# Patient Record
Sex: Male | Born: 1955 | Race: White | Hispanic: No | Smoking: Former smoker
Health system: Southern US, Community
[De-identification: ages and names within clinical notes are randomized; demographics above are authoritative.]

## PROBLEM LIST (undated history)

## (undated) DIAGNOSIS — Z9989 Dependence on other enabling machines and devices: Secondary | ICD-10-CM

## (undated) DIAGNOSIS — I480 Paroxysmal atrial fibrillation: Secondary | ICD-10-CM

## (undated) DIAGNOSIS — I4892 Unspecified atrial flutter: Secondary | ICD-10-CM

## (undated) DIAGNOSIS — I714 Abdominal aortic aneurysm, without rupture: Secondary | ICD-10-CM

## (undated) DIAGNOSIS — G4733 Obstructive sleep apnea (adult) (pediatric): Secondary | ICD-10-CM

## (undated) DIAGNOSIS — G4730 Sleep apnea, unspecified: Secondary | ICD-10-CM

## (undated) DIAGNOSIS — E785 Hyperlipidemia, unspecified: Secondary | ICD-10-CM

## (undated) HISTORY — DX: Unspecified atrial flutter: I48.92

## (undated) HISTORY — DX: Obstructive sleep apnea (adult) (pediatric): G47.33

## (undated) HISTORY — DX: Paroxysmal atrial fibrillation: I48.0

## (undated) HISTORY — PX: NO PAST SURGERIES: SHX2092

## (undated) HISTORY — DX: Sleep apnea, unspecified: G47.30

## (undated) HISTORY — DX: Dependence on other enabling machines and devices: Z99.89

---
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
## 2000-08-10 ENCOUNTER — Emergency Department (HOSPITAL_COMMUNITY): Admission: EM | Admit: 2000-08-10 | Discharge: 2000-08-10 | Payer: Self-pay | Admitting: Emergency Medicine

## 2004-10-26 ENCOUNTER — Ambulatory Visit: Payer: Self-pay | Admitting: Family Medicine

## 2004-11-01 ENCOUNTER — Ambulatory Visit: Payer: Self-pay | Admitting: Family Medicine

## 2005-10-31 ENCOUNTER — Ambulatory Visit: Payer: Self-pay | Admitting: Family Medicine

## 2005-11-20 ENCOUNTER — Ambulatory Visit: Payer: Self-pay | Admitting: Family Medicine

## 2006-11-27 ENCOUNTER — Telehealth: Payer: Self-pay | Admitting: Family Medicine

## 2006-12-28 ENCOUNTER — Ambulatory Visit: Payer: Self-pay | Admitting: Family Medicine

## 2006-12-28 LAB — CONVERTED CEMR LAB
ALT: 23 units/L (ref 0–53)
Albumin: 3.9 g/dL (ref 3.5–5.2)
Alkaline Phosphatase: 49 units/L (ref 39–117)
BUN: 20 mg/dL (ref 6–23)
Basophils Absolute: 0 10*3/uL (ref 0.0–0.1)
Basophils Relative: 0.8 % (ref 0.0–1.0)
Bilirubin Urine: NEGATIVE
Blood in Urine, dipstick: NEGATIVE
CO2: 26 meq/L (ref 19–32)
Calcium: 9.6 mg/dL (ref 8.4–10.5)
Cholesterol: 206 mg/dL (ref 0–200)
Creatinine, Ser: 1.2 mg/dL (ref 0.4–1.5)
Direct LDL: 144 mg/dL
GFR calc Af Amer: 81 mL/min
Glucose, Urine, Semiquant: NEGATIVE
HDL: 50.1 mg/dL (ref >39.0)
Ketones, urine, test strip: NEGATIVE
MCHC: 35 g/dL (ref 30.0–36.0)
Monocytes Absolute: 0.6 10*3/uL (ref 0.2–0.7)
Monocytes Relative: 12.7 % — ABNORMAL HIGH (ref 3.0–11.0)
Neutro Abs: 2.2 10*3/uL (ref 1.4–7.7)
Platelets: 203 10*3/uL (ref 150–400)
Potassium: 4.6 meq/L (ref 3.5–5.1)
Protein, U semiquant: NEGATIVE
RDW: 12.1 % (ref 11.5–14.6)
TSH: 2.7 microintl units/mL (ref 0.35–5.50)
Total CHOL/HDL Ratio: 4.1
Total Protein: 6.8 g/dL (ref 6.0–8.3)
Urobilinogen, UA: 0.2
VLDL: 10 mg/dL (ref 0–40)

## 2007-01-07 ENCOUNTER — Telehealth (INDEPENDENT_AMBULATORY_CARE_PROVIDER_SITE_OTHER): Payer: Self-pay | Admitting: *Deleted

## 2007-01-07 ENCOUNTER — Ambulatory Visit: Payer: Self-pay | Admitting: Family Medicine

## 2007-01-07 DIAGNOSIS — E785 Hyperlipidemia, unspecified: Secondary | ICD-10-CM | POA: Insufficient documentation

## 2007-01-07 DIAGNOSIS — L578 Other skin changes due to chronic exposure to nonionizing radiation: Secondary | ICD-10-CM | POA: Insufficient documentation

## 2007-07-26 ENCOUNTER — Telehealth: Payer: Self-pay | Admitting: *Deleted

## 2008-01-02 ENCOUNTER — Ambulatory Visit: Payer: Self-pay | Admitting: Family Medicine

## 2008-01-02 LAB — CONVERTED CEMR LAB
Albumin: 4.1 g/dL (ref 3.5–5.2)
Alkaline Phosphatase: 38 units/L — ABNORMAL LOW (ref 39–117)
BUN: 17 mg/dL (ref 6–23)
Basophils Absolute: 0 10*3/uL (ref 0.0–0.1)
Bilirubin Urine: NEGATIVE
Blood in Urine, dipstick: NEGATIVE
Cholesterol: 202 mg/dL (ref 0–200)
Direct LDL: 113.4 mg/dL
Eosinophils Absolute: 0.1 10*3/uL (ref 0.0–0.7)
Eosinophils Relative: 2.6 % (ref 0.0–5.0)
GFR calc Af Amer: 90 mL/min
GFR calc non Af Amer: 74 mL/min
HCT: 40.1 % (ref 39.0–52.0)
HDL: 52.4 mg/dL (ref >39.0)
MCHC: 34.7 g/dL (ref 30.0–36.0)
MCV: 102 fL — ABNORMAL HIGH (ref 78.0–100.0)
Monocytes Absolute: 0.5 10*3/uL (ref 0.1–1.0)
Neutrophils Relative %: 44.9 % (ref 43.0–77.0)
Platelets: 177 10*3/uL (ref 150–400)
Potassium: 3.7 meq/L (ref 3.5–5.1)
RDW: 11.9 % (ref 11.5–14.6)
Specific Gravity, Urine: <1.005
TSH: 2.24 microintl units/mL (ref 0.35–5.50)
Total Bilirubin: 2.7 mg/dL — ABNORMAL HIGH (ref 0.3–1.2)
Triglycerides: 72 mg/dL (ref 0–149)
WBC: 3.8 10*3/uL — ABNORMAL LOW (ref 4.5–10.5)
pH: 6

## 2008-01-09 ENCOUNTER — Ambulatory Visit: Payer: Self-pay | Admitting: Family Medicine

## 2008-01-13 ENCOUNTER — Telehealth: Payer: Self-pay | Admitting: *Deleted

## 2008-03-09 ENCOUNTER — Telehealth: Payer: Self-pay | Admitting: *Deleted

## 2008-04-15 ENCOUNTER — Telehealth: Payer: Self-pay | Admitting: Family Medicine

## 2008-08-18 ENCOUNTER — Telehealth: Payer: Self-pay | Admitting: *Deleted

## 2008-11-04 ENCOUNTER — Telehealth: Payer: Self-pay | Admitting: Family Medicine

## 2009-01-04 ENCOUNTER — Telehealth: Payer: Self-pay | Admitting: Family Medicine

## 2009-01-05 ENCOUNTER — Ambulatory Visit: Payer: Self-pay | Admitting: Family Medicine

## 2009-01-05 LAB — CONVERTED CEMR LAB
ALT: 30 units/L (ref 0–53)
BUN: 16 mg/dL (ref 6–23)
Basophils Absolute: 0 10*3/uL (ref 0.0–0.1)
Bilirubin Urine: NEGATIVE
Chloride: 99 meq/L (ref 96–112)
Eosinophils Relative: 2.7 % (ref 0.0–5.0)
Glucose, Bld: 102 mg/dL — ABNORMAL HIGH (ref 70–99)
Glucose, Urine, Semiquant: NEGATIVE
HCT: 38.9 % — ABNORMAL LOW (ref 39.0–52.0)
Lymphs Abs: 1.3 10*3/uL (ref 0.7–4.0)
MCHC: 35.5 g/dL (ref 30.0–36.0)
MCV: 105.6 fL — ABNORMAL HIGH (ref 78.0–100.0)
Monocytes Absolute: 0.5 10*3/uL (ref 0.1–1.0)
PSA: 0.44 ng/mL (ref 0.10–4.00)
Platelets: 172 10*3/uL (ref 150.0–400.0)
Potassium: 4.3 meq/L (ref 3.5–5.1)
RDW: 12.1 % (ref 11.5–14.6)
Specific Gravity, Urine: 1.01
TSH: 1.95 microintl units/mL (ref 0.35–5.50)
Total Bilirubin: 3.4 mg/dL — ABNORMAL HIGH (ref 0.3–1.2)
WBC Urine, dipstick: NEGATIVE
pH: 5.5

## 2009-01-12 ENCOUNTER — Ambulatory Visit: Payer: Self-pay | Admitting: Family Medicine

## 2009-05-13 ENCOUNTER — Telehealth: Payer: Self-pay | Admitting: Family Medicine

## 2009-11-15 ENCOUNTER — Telehealth: Payer: Self-pay | Admitting: Family Medicine

## 2010-02-01 ENCOUNTER — Ambulatory Visit: Payer: Self-pay | Admitting: Family Medicine

## 2010-02-01 LAB — CONVERTED CEMR LAB
Albumin: 4.2 g/dL (ref 3.5–5.2)
Basophils Absolute: 0 10*3/uL (ref 0.0–0.1)
CO2: 29 meq/L (ref 19–32)
Calcium: 9.3 mg/dL (ref 8.4–10.5)
Direct LDL: 126.8 mg/dL
Eosinophils Absolute: 0.1 10*3/uL (ref 0.0–0.7)
Glucose, Bld: 107 mg/dL — ABNORMAL HIGH (ref 70–99)
HCT: 40.6 % (ref 39.0–52.0)
Hemoglobin: 14 g/dL (ref 13.0–17.0)
Leukocytes, UA: NEGATIVE
Lymphocytes Relative: 31.3 % (ref 12.0–46.0)
Lymphs Abs: 1.3 10*3/uL (ref 0.7–4.0)
MCHC: 34.4 g/dL (ref 30.0–36.0)
MCV: 101.3 fL — ABNORMAL HIGH (ref 78.0–100.0)
Monocytes Absolute: 0.6 10*3/uL (ref 0.1–1.0)
Neutro Abs: 2.1 10*3/uL (ref 1.4–7.7)
PSA: 0.85 ng/mL (ref 0.10–4.00)
RDW: 13.2 % (ref 11.5–14.6)
Sodium: 135 meq/L (ref 135–145)
Specific Gravity, Urine: <=1.005 (ref 1.000–1.030)
TSH: 1.48 microintl units/mL (ref 0.35–5.50)
Triglycerides: 60 mg/dL (ref 0.0–149.0)
Urobilinogen, UA: 0.2 (ref 0.0–1.0)

## 2010-02-08 ENCOUNTER — Encounter: Payer: Self-pay | Admitting: Family Medicine

## 2010-02-08 ENCOUNTER — Ambulatory Visit: Payer: Self-pay | Admitting: Family Medicine

## 2010-04-07 NOTE — Progress Notes (Signed)
Summary: refill diazepam  Phone Note From Pharmacy   Caller: [facility]  [phone]* Call For: todd  Summary of Call: refill diazepam 5mg  1 by mouth at bedtime as needed  Initial call taken by: Alfred Levins, CMA,  May 13, 2009 2:31 PM  Follow-up for Phone Call        valium 5 mg, dispense 30 tabs directions one nightly p.r.n. refills x 3 Follow-up by: Roderick Pee MD,  May 13, 2009 2:41 PM  Additional Follow-up for Phone Call Additional follow up Details #1::        Phone call completed, Pharmacist called Additional Follow-up by: Alfred Levins, CMA,  May 13, 2009 2:59 PM    Prescriptions: DIAZEPAM 5 MG TABS (DIAZEPAM) take one tab at bedtime  #30 x 3   Entered by:   Alfred Levins, CMA   Authorized by:   Roderick Pee MD   Signed by:   Alfred Levins, CMA on 05/13/2009   Method used:   Telephoned to ...       [facility]  [phone]* (retail)       [address] [place], [place]  [address]       Ph: [phone] or [phone]       Fax: [phone]   RxID:   [id number]

## 2010-04-07 NOTE — Progress Notes (Signed)
Summary: diazepam refill  Phone Note Refill Request Message from:  Fax from Pharmacy on November 15, 2009 12:34 PM  Refills Requested: Medication #1:  DIAZEPAM 5 MG TABS take one tab at bedtime Initial call taken by: Kern Reap CMA Duncan Dull),  November 15, 2009 12:34 PM    Prescriptions: DIAZEPAM 5 MG TABS (DIAZEPAM) take one tab at bedtime  #90 x 1   Entered by:   Kern Reap CMA (AAMA)   Authorized by:   Roderick Pee MD   Signed by:   Kern Reap CMA (AAMA) on 11/15/2009   Method used:   Historical   RxID:   [id number]

## 2010-04-07 NOTE — Assessment & Plan Note (Signed)
Summary: CPX // RS   Vital Signs:  Patient profile:   55 year old male Height:      72.25 inches Weight:      179 pounds BMI:     24.20 Temp:     98.9 degrees F oral BP sitting:   124 / 84  (left arm) Cuff size:   regular  Vitals Entered By: Kern Reap CMA Duncan Dull) (February 08, 2010 4:18 PM) CC: cpx Is Patient Diabetic? No Pain Assessment Patient in pain? yes        CC:  cpx.  History of Present Illness: Steven Byrd is a 55 year old, married man nonsmoker, who comes in today for general physical examination  He is an occasional herpetic outbreak for which he takes Zovirax 800 mg 3 times a day.  He also takes a quarter of a tablet a proscribed daily, and diazepam nightly, p.r.n. for sleep.  He also takes simvastatin 40 mg nightly for hyperlipidemia, and prior 100 mg p.r.n. for ED.  Review of systems negative.  Colonoscopy at age 26.  Normal tetanus booster and flu shot today.  Review of systems otherwise negative.  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  Allergies: No Known Drug Allergies  Past History:  Past medical, surgical, family and social histories (including risk factors) reviewed, and no changes noted (except as noted below).  Past Medical History: Reviewed history from 01/07/2007 and no changes required. Hyperlipidemia sun damaged skin  Family History: Reviewed history from 01/07/2007 and no changes required. Family History of CAD Male 1st degree relative <60 Family History Diabetes 1st degree relative Family History Hypertension Family History of Prostate CA 1st degree relative <50  Social History: Reviewed history from 01/07/2007 and no changes required. Occupation:[employer] Married divorced now remarried Former Smoker Alcohol use-yes Drug use-no Regular exercise-yes  Review of Systems      See HPI       Flu Vaccine Consent Questions     Do  you have a history of severe allergic reactions to this vaccine? no    Any prior history of allergic reactions to egg and/or gelatin? no    Do you have a sensitivity to the preservative Thimersol? no    Do you have a past history of Guillan-Barre Syndrome? no    Do you currently have an acute febrile illness? no    Have you ever had a severe reaction to latex? no    Vaccine information given and explained to patient? yes    Are you currently pregnant? no    Lot Number:[id number]   Exp Date:09/03/2010   Site Given  right  Deltoid IM   Physical Exam  General:  Well-developed,well-nourished,in no acute distress; alert,appropriate and cooperative throughout examination Head:  Normocephalic and atraumatic without obvious abnormalities. No apparent alopecia or balding. Eyes:  No corneal or conjunctival inflammation noted. EOMI. Perrla. Funduscopic exam benign, without hemorrhages, exudates or papilledema. Vision grossly normal. Ears:  External ear exam shows no significant lesions or deformities.  Otoscopic examination reveals clear canals, tympanic membranes are intact bilaterally without bulging, retraction, inflammation or discharge. Hearing is grossly normal bilaterally. Nose:  External nasal examination shows no deformity or inflammation. Nasal mucosa are pink and moist without lesions or exudates. Mouth:  Oral mucosa and oropharynx without lesions or exudates.  Teeth in good repair. Neck:  No deformities, masses, or tenderness noted.  Chest Wall:  No deformities, masses, tenderness or gynecomastia noted. Breasts:  No masses or gynecomastia noted Lungs:  Normal respiratory effort, chest expands symmetrically. Lungs are clear to auscultation, no crackles or wheezes. Heart:  Normal rate and regular rhythm. S1 and S2 normal without gallop, murmur, click, rub or other extra sounds. Abdomen:  Bowel sounds positive,abdomen soft and non-tender without masses, organomegaly or hernias noted. Rectal:   No external abnormalities noted. Normal sphincter tone. No rectal masses or tenderness. Genitalia:  Testes bilaterally descended without nodularity, tenderness or masses. No scrotal masses or lesions. No penis lesions or urethral discharge. Prostate:  Prostate gland firm and smooth, no enlargement, nodularity, tenderness, mass, asymmetry or induration. Msk:  No deformity or scoliosis noted of thoracic or lumbar spine.   Pulses:  R and L carotid,radial,femoral,dorsalis pedis and posterior tibial pulses are full and equal bilaterally Extremities:  No clubbing, cyanosis, edema, or deformity noted with normal full range of motion of all joints.   Neurologic:  No cranial nerve deficits noted. Station and gait are normal. Plantar reflexes are down-going bilaterally. DTRs are symmetrical throughout. Sensory, motor and coordinative functions appear intact. Skin:  Intact without suspicious lesions or rashes Cervical Nodes:  No lymphadenopathy noted Axillary Nodes:  No palpable lymphadenopathy Inguinal Nodes:  No significant adenopathy Psych:  Cognition and judgment appear intact. Alert and cooperative with normal attention span and concentration. No apparent delusions, illusions, hallucinations   Impression & Recommendations:  Problem # 1:  HYPERLIPIDEMIA (ICD-272.4) Assessment Improved  His updated medication list for this problem includes:    Simvastatin 40 Mg Tabs (Simvastatin) .[name]... 1 tab @ bedtime  Orders: Prescription Created Electronically [phone]) EKG w/ Interpretation (93000)  Problem # 2:  PHYSICAL EXAMINATION (ICD-V70.0) Assessment: Improved  Orders: Prescription Created Electronically [phone]) EKG w/ Interpretation (93000)  Complete Medication List: 1)  Asa 81  2)  Mvi  3)  Fish Oil/ Fax Seed  4)  Zovirax 800 Mg Tabs (Acyclovir) .... Take 1 tablet by mouth three times a day prn 5)  Proscar 5 Mg Tabs (Finasteride) .... Take quarter of tablet once daily 6)  Diazepam 5 Mg Tabs  (Diazepam) .... Take one tab at bedtime 7)  Simvastatin 40 Mg Tabs (Simvastatin) .[name].. 1 tab @ bedtime 8)  Viagra 100 Mg Tabs (Sildenafil citrate) .... Uad  Other Orders: Admin 1st Vaccine (09811) Flu Vaccine 25yrs + (91478) TD Toxoids IM 7 YR + (29562) Admin of Any Addtl Vaccine (13086)  Patient Instructions: 1)  Please schedule a follow-up appointment in 1 year. Prescriptions: DIAZEPAM 5 MG TABS (DIAZEPAM) take one tab at bedtime  #90 x 1   Entered and Authorized by:   Roderick Pee MD   Signed by:   Roderick Pee MD on 02/08/2010   Method used:   Print then Give to Patient   RxID:   [id number] VIAGRA 100 MG TABS (SILDENAFIL CITRATE) UAD  #6 x 11   Entered and Authorized by:   Roderick Pee MD   Signed by:   Roderick Pee MD on 02/08/2010   Method used:   Electronically to        [facility]  [phone]* (retail)       [address] [place], [place]  [address]       Ph: [phone] or [phone]       Fax: [phone]   RxID:   [id number] SIMVASTATIN 40 MG TABS (SIMVASTATIN) 1 tab @  bedtime  #100 x 3   Entered and Authorized by:   Roderick Pee MD   Signed by:   Roderick Pee MD on 02/08/2010   Method used:   Electronically to        [facility]  [phone]* (retail)       [address] [place], [place]  [address]       Ph: [phone] or [phone]       Fax: [phone]   RxID:   [id number] PROSCAR 5 MG TABS (FINASTERIDE) take quarter of tablet once daily  #8 Tablet x 11   Entered and Authorized by:   Roderick Pee MD   Signed by:   Roderick Pee MD on 02/08/2010   Method used:   Electronically to        [facility]  [phone]* (retail)       [address] [place], [place]  [address]       Ph: [phone] or [phone]       Fax: [phone]   RxID:   [id number] ZOVIRAX 800 MG  TABS (ACYCLOVIR) Take 1 tablet by mouth three times a day prn  #100 x 2   Entered and Authorized by:   Roderick Pee MD    Signed by:   Roderick Pee MD on 02/08/2010   Method used:   Electronically to        [facility]  [phone]* (retail)       [address] [place], [place]  [address]       Ph: [phone] or [phone]       Fax: [phone]   RxID:   [id number]    Orders Added: 1)  Prescription Created Electronically [G8553] 2)  Est. Patient 40-64 years [99396] 3)  EKG w/ Interpretation [93000] 4)  Admin 1st Vaccine [90471] 5)  Flu Vaccine 56yrs + [90658] 6)  TD Toxoids IM 7 YR + [90714] 7)  Admin of Any Addtl Vaccine [25852]   Immunizations Administered:  Tetanus Vaccine:    Vaccine Type: Td    Site: left deltoid    Mfr: GlaxoSmithKline    Dose: 0.5 ml    Route: IM    Given by: Kern Reap CMA (AAMA)    Exp. Date: 12/24/2011    Lot #: [id number]    VIS given: 01/22/08 version given February 08, 2010.    Physician counseled: yes   Immunizations Administered:  Tetanus Vaccine:    Vaccine Type: Td    Site: left deltoid    Mfr: GlaxoSmithKline    Dose: 0.5 ml    Route: IM    Given by: Kern Reap CMA (AAMA)    Exp. Date: 12/24/2011    Lot #: [id number]    VIS given: 01/22/08 version given February 08, 2010.    Physician counseled: yes    Preventive Care Screening  Colonoscopy:    Date:  03/07/2003    Results:  normal

## 2010-08-24 ENCOUNTER — Telehealth: Payer: Self-pay | Admitting: Family Medicine

## 2010-08-24 NOTE — Telephone Encounter (Signed)
Pt called 6/20 requesting Flexorill (spelling?) and a pain med. States he tweaked his back and Dr. Tawanna Cooler does this for him. Please call if needed.

## 2010-08-25 MED ORDER — HYDROCODONE-ACETAMINOPHEN 7.5-750 MG PO TABS: ORAL_TABLET | ORAL | Status: DC

## 2010-08-25 MED ORDER — CYCLOBENZAPRINE HCL 5 MG PO TABS: 5.0000 mg | ORAL_TABLET | Freq: Every day | ORAL | Status: AC

## 2010-08-25 NOTE — Telephone Encounter (Signed)
Left message on machine for patient  To return our call with name of pharmacy

## 2010-08-25 NOTE — Telephone Encounter (Signed)
Flexeril 5 mg, dispense 30, tablets directions one tab nightly p.r.n. For back pain, refills x 1.  Vicodin ES, number 30 directions one half to one tab nightly p.r.n. Back pain.  No refills

## 2010-08-26 NOTE — Telephone Encounter (Signed)
rx sent to [facility]

## 2010-09-01 ENCOUNTER — Other Ambulatory Visit: Payer: Self-pay | Admitting: *Deleted

## 2010-09-01 MED ORDER — DIAZEPAM 5 MG PO TABS: 5.0000 mg | ORAL_TABLET | Freq: Four times a day (QID) | ORAL | Status: AC | PRN

## 2011-03-06 ENCOUNTER — Other Ambulatory Visit: Payer: Self-pay | Admitting: *Deleted

## 2011-03-06 ENCOUNTER — Other Ambulatory Visit: Payer: Self-pay | Admitting: Family Medicine

## 2011-03-06 MED ORDER — DIAZEPAM 5 MG PO TABS: 5.0000 mg | ORAL_TABLET | Freq: Two times a day (BID) | ORAL | Status: AC | PRN

## 2011-04-07 ENCOUNTER — Other Ambulatory Visit: Payer: Self-pay | Admitting: *Deleted

## 2011-04-07 MED ORDER — SILDENAFIL CITRATE 100 MG PO TABS: 100.0000 mg | ORAL_TABLET | ORAL | Status: DC | PRN

## 2011-05-04 ENCOUNTER — Other Ambulatory Visit: Payer: Self-pay | Admitting: Family Medicine

## 2011-06-06 ENCOUNTER — Other Ambulatory Visit: Payer: Self-pay | Admitting: Family Medicine

## 2011-06-07 ENCOUNTER — Other Ambulatory Visit: Payer: Self-pay | Admitting: *Deleted

## 2011-06-07 MED ORDER — DIAZEPAM 5 MG PO TABS: 5.0000 mg | ORAL_TABLET | Freq: Two times a day (BID) | ORAL | Status: AC | PRN

## 2011-07-06 ENCOUNTER — Other Ambulatory Visit: Payer: Self-pay | Admitting: Family Medicine

## 2011-07-06 MED ORDER — SIMVASTATIN 40 MG PO TABS: 40.0000 mg | ORAL_TABLET | Freq: Every day | ORAL | Status: DC

## 2011-07-06 MED ORDER — SILDENAFIL CITRATE 100 MG PO TABS: 100.0000 mg | ORAL_TABLET | ORAL | Status: DC | PRN

## 2011-07-06 NOTE — Telephone Encounter (Signed)
Pt has cpx sch for 10-17-2011. Pt needs refill on viagra 100mg ,simvastatin 40mg  [facility] [phone]

## 2011-07-13 ENCOUNTER — Other Ambulatory Visit: Payer: Self-pay | Admitting: Family Medicine

## 2011-08-16 ENCOUNTER — Other Ambulatory Visit: Payer: Self-pay | Admitting: *Deleted

## 2011-08-16 MED ORDER — DIAZEPAM 5 MG PO TABS: 5.0000 mg | ORAL_TABLET | Freq: Two times a day (BID) | ORAL | Status: DC | PRN

## 2011-10-10 ENCOUNTER — Other Ambulatory Visit: Payer: Self-pay

## 2011-10-17 ENCOUNTER — Encounter: Payer: Self-pay | Admitting: Family Medicine

## 2011-11-12 ENCOUNTER — Other Ambulatory Visit: Payer: Self-pay | Admitting: Family Medicine

## 2011-12-12 ENCOUNTER — Other Ambulatory Visit: Payer: Self-pay | Admitting: Family Medicine

## 2011-12-13 ENCOUNTER — Other Ambulatory Visit: Payer: Self-pay | Admitting: Family Medicine

## 2012-01-11 ENCOUNTER — Other Ambulatory Visit (INDEPENDENT_AMBULATORY_CARE_PROVIDER_SITE_OTHER): Payer: 59

## 2012-01-11 DIAGNOSIS — Z0000 Encounter for general adult medical examination without abnormal findings: Secondary | ICD-10-CM

## 2012-01-11 LAB — POCT URINALYSIS DIPSTICK
Bilirubin, UA: NEGATIVE
Blood, UA: NEGATIVE
Glucose, UA: NEGATIVE
Spec Grav, UA: <=1.005
pH, UA: 6

## 2012-01-11 LAB — LIPID PANEL
Cholesterol: 200 mg/dL (ref 0–200)
HDL: 50.4 mg/dL (ref >39.00)
Total CHOL/HDL Ratio: 4
Triglycerides: 72 mg/dL (ref 0.0–149.0)

## 2012-01-11 LAB — CBC WITH DIFFERENTIAL/PLATELET
Basophils Relative: 1.1 % (ref 0.0–3.0)
Eosinophils Absolute: 0.1 10*3/uL (ref 0.0–0.7)
Eosinophils Relative: 1.8 % (ref 0.0–5.0)
Lymphocytes Relative: 35.3 % (ref 12.0–46.0)
MCHC: 32.9 g/dL (ref 30.0–36.0)
MCV: 101.8 fl — ABNORMAL HIGH (ref 78.0–100.0)
Monocytes Absolute: 0.6 10*3/uL (ref 0.1–1.0)
Neutrophils Relative %: 48.9 % (ref 43.0–77.0)
Platelets: 203 10*3/uL (ref 150.0–400.0)
RBC: 4.14 Mil/uL — ABNORMAL LOW (ref 4.22–5.81)
WBC: 4.3 10*3/uL — ABNORMAL LOW (ref 4.5–10.5)

## 2012-01-11 LAB — HEPATIC FUNCTION PANEL
ALT: 33 U/L (ref 0–53)
AST: 42 U/L — ABNORMAL HIGH (ref 0–37)
Alkaline Phosphatase: 44 U/L (ref 39–117)
Bilirubin, Direct: 0.3 mg/dL (ref 0.0–0.3)
Total Bilirubin: 2 mg/dL — ABNORMAL HIGH (ref 0.3–1.2)
Total Protein: 6.9 g/dL (ref 6.0–8.3)

## 2012-01-11 LAB — BASIC METABOLIC PANEL
BUN: 19 mg/dL (ref 6–23)
Calcium: 8.9 mg/dL (ref 8.4–10.5)
Chloride: 99 mEq/L (ref 96–112)
Creatinine, Ser: 1.1 mg/dL (ref 0.4–1.5)

## 2012-01-18 ENCOUNTER — Encounter: Payer: Self-pay | Admitting: Family Medicine

## 2012-01-18 ENCOUNTER — Ambulatory Visit (INDEPENDENT_AMBULATORY_CARE_PROVIDER_SITE_OTHER): Payer: 59 | Admitting: Family Medicine

## 2012-01-18 VITALS — BP 120/80 | Temp 98.2°F | Ht 72.5 in | Wt 185.0 lb

## 2012-01-18 DIAGNOSIS — N529 Male erectile dysfunction, unspecified: Secondary | ICD-10-CM

## 2012-01-18 DIAGNOSIS — R253 Fasciculation: Secondary | ICD-10-CM | POA: Insufficient documentation

## 2012-01-18 DIAGNOSIS — L578 Other skin changes due to chronic exposure to nonionizing radiation: Secondary | ICD-10-CM

## 2012-01-18 DIAGNOSIS — E785 Hyperlipidemia, unspecified: Secondary | ICD-10-CM

## 2012-01-18 DIAGNOSIS — Z23 Encounter for immunization: Secondary | ICD-10-CM

## 2012-01-18 DIAGNOSIS — R259 Unspecified abnormal involuntary movements: Secondary | ICD-10-CM

## 2012-01-18 MED ORDER — SILDENAFIL CITRATE 100 MG PO TABS: 100.0000 mg | ORAL_TABLET | ORAL | Status: DC | PRN

## 2012-01-18 MED ORDER — FINASTERIDE 5 MG PO TABS: ORAL_TABLET | ORAL | Status: DC

## 2012-01-18 MED ORDER — SIMVASTATIN 40 MG PO TABS: 40.0000 mg | ORAL_TABLET | Freq: Every day | ORAL | Status: DC

## 2012-01-18 MED ORDER — DIAZEPAM 5 MG PO TABS: ORAL_TABLET | ORAL | Status: DC

## 2012-01-18 NOTE — Patient Instructions (Signed)
Continue current medications  Remember to do a thorough skin exam monthly  Return in one year sooner if any problem

## 2012-01-18 NOTE — Progress Notes (Signed)
  Subjective:    Patient ID: Steven Byrd, male    DOB: 13-Apr-1955, 56 y.o.   MRN: [id number]  HPI Steven Byrd is a 56 year old recently separated from his second wife who comes in today for general physical examination  He's always been in excellent health has had no chronic health problems. He takes a quarter of a tablet of finasteride for here rejuvenation  He takes Viagra when necessary for ED,  Zocor 40 mg daily for hyperlipidemia and a generic Valium when necessary for sleep dysfunction  Review of systems negative except he is recently began to experience fasciculations of both right and left calf no neurologic symptoms  Tetanus booster 2011 seasonal flu shot today   Review of Systems  Constitutional: Negative.   HENT: Negative.   Eyes: Negative.   Respiratory: Negative.   Cardiovascular: Negative.   Gastrointestinal: Negative.   Genitourinary: Negative.   Musculoskeletal: Negative.   Skin: Negative.   Neurological: Negative.   Hematological: Negative.   Psychiatric/Behavioral: Negative.        Objective:   Physical Exam  Constitutional: He is oriented to person, place, and time. He appears well-developed and well-nourished.  HENT:  Head: Normocephalic and atraumatic.  Right Ear: External ear normal.  Left Ear: External ear normal.  Nose: Nose normal.  Mouth/Throat: Oropharynx is clear and moist.  Eyes: Conjunctivae normal and EOM are normal. Pupils are equal, round, and reactive to light.  Neck: Normal range of motion. Neck supple. No JVD present. No tracheal deviation present. No thyromegaly present.  Cardiovascular: Normal rate, regular rhythm, normal heart sounds and intact distal pulses.  Exam reveals no gallop and no friction rub.   No murmur heard. Pulmonary/Chest: Effort normal and breath sounds normal. No stridor. No respiratory distress. He has no wheezes. He has no rales. He exhibits no tenderness.  Abdominal: Soft. Bowel sounds are normal. He exhibits  no distension and no mass. There is no tenderness. There is no rebound and no guarding.  Genitourinary: Rectum normal, prostate normal and penis normal. Guaiac negative stool. No penile tenderness.  Musculoskeletal: Normal range of motion. He exhibits no edema and no tenderness.  Lymphadenopathy:    He has no cervical adenopathy.  Neurological: He is alert and oriented to person, place, and time. He has normal reflexes. No cranial nerve deficit. He exhibits normal muscle tone.  Skin: Skin is warm and dry. No rash noted. No erythema. No pallor.       Total body skin exam normal he also had a recent evaluation by dermatology. 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 His behavior is normal. Judgment and thought content normal.          Assessment & Plan:  Healthy male  Continue finasteride for hair loss prevention  Erectile dysfunction continue Viagra  Hyperlipidemia continue Zocor and aspirin  High-risk for skin cancer again advise SPF 50 sunscreens

## 2012-02-13 ENCOUNTER — Other Ambulatory Visit: Payer: Self-pay | Admitting: Family Medicine

## 2012-03-25 ENCOUNTER — Other Ambulatory Visit: Payer: Self-pay | Admitting: Family Medicine

## 2012-04-01 ENCOUNTER — Other Ambulatory Visit: Payer: Self-pay | Admitting: Family Medicine

## 2012-04-03 ENCOUNTER — Other Ambulatory Visit: Payer: Self-pay | Admitting: Family Medicine

## 2012-07-22 ENCOUNTER — Other Ambulatory Visit: Payer: Self-pay | Admitting: Family Medicine

## 2012-08-20 ENCOUNTER — Other Ambulatory Visit: Payer: Self-pay | Admitting: Family Medicine

## 2012-10-11 ENCOUNTER — Other Ambulatory Visit: Payer: Self-pay | Admitting: Family Medicine

## 2012-12-16 ENCOUNTER — Other Ambulatory Visit: Payer: Self-pay | Admitting: Family Medicine

## 2013-01-18 ENCOUNTER — Other Ambulatory Visit: Payer: Self-pay | Admitting: Family Medicine

## 2013-01-21 ENCOUNTER — Other Ambulatory Visit: Payer: Self-pay | Admitting: Family Medicine

## 2013-02-24 ENCOUNTER — Other Ambulatory Visit: Payer: Self-pay | Admitting: Family Medicine

## 2013-03-26 ENCOUNTER — Other Ambulatory Visit: Payer: Self-pay | Admitting: Family Medicine

## 2013-03-28 ENCOUNTER — Other Ambulatory Visit: Payer: Self-pay | Admitting: Family Medicine

## 2013-03-31 ENCOUNTER — Other Ambulatory Visit: Payer: Self-pay | Admitting: Family Medicine

## 2013-03-31 NOTE — Telephone Encounter (Signed)
Pt has appt sch for mar 2015

## 2013-04-28 ENCOUNTER — Other Ambulatory Visit: Payer: Self-pay | Admitting: Family Medicine

## 2013-05-12 ENCOUNTER — Telehealth: Payer: Self-pay | Admitting: Family Medicine

## 2013-05-12 NOTE — Telephone Encounter (Signed)
lmom to cb and sch

## 2013-05-12 NOTE — Telephone Encounter (Signed)
Pt got a piece of glass in his foot several months ago (6-8) .pt states it still bothers him, thought it was out, but it has gotten bothersome. Pt would life to know if he should see dr todd or a referral to someone else. pls advise

## 2013-05-12 NOTE — Telephone Encounter (Signed)
Okay to schedule with Dr Todd 

## 2013-05-13 NOTE — Telephone Encounter (Signed)
Pt has decided to wait until his cpe on 3/23 for dr todd to look at his foot. Pt states this has been going on several months anyway, a few more days won't matter.

## 2013-05-16 ENCOUNTER — Other Ambulatory Visit (INDEPENDENT_AMBULATORY_CARE_PROVIDER_SITE_OTHER): Payer: BC Managed Care – PPO

## 2013-05-16 DIAGNOSIS — Z0000 Encounter for general adult medical examination without abnormal findings: Secondary | ICD-10-CM

## 2013-05-16 LAB — HEPATIC FUNCTION PANEL
ALT: 23 U/L (ref 0–53)
AST: 23 U/L (ref 0–37)
Albumin: 4.3 g/dL (ref 3.5–5.2)
Alkaline Phosphatase: 53 U/L (ref 39–117)
BILIRUBIN TOTAL: 2.3 mg/dL — AB (ref 0.3–1.2)
Bilirubin, Direct: 0.3 mg/dL (ref 0.0–0.3)
TOTAL PROTEIN: 6.9 g/dL (ref 6.0–8.3)

## 2013-05-16 LAB — POCT URINALYSIS DIPSTICK
Bilirubin, UA: NEGATIVE
Blood, UA: NEGATIVE
Glucose, UA: NEGATIVE
Ketones, UA: NEGATIVE
Leukocytes, UA: NEGATIVE
NITRITE UA: NEGATIVE
PH UA: 6
PROTEIN UA: NEGATIVE
Spec Grav, UA: 1.01
Urobilinogen, UA: 0.2

## 2013-05-16 LAB — CBC WITH DIFFERENTIAL/PLATELET
BASOS PCT: 0.5 % (ref 0.0–3.0)
Basophils Absolute: 0 10*3/uL (ref 0.0–0.1)
EOS PCT: 1.9 % (ref 0.0–5.0)
Eosinophils Absolute: 0.1 10*3/uL (ref 0.0–0.7)
HCT: 40.9 % (ref 39.0–52.0)
Hemoglobin: 13.6 g/dL (ref 13.0–17.0)
LYMPHS ABS: 1.6 10*3/uL (ref 0.7–4.0)
Lymphocytes Relative: 26.2 % (ref 12.0–46.0)
MCHC: 33.2 g/dL (ref 30.0–36.0)
MCV: 100.9 fl — ABNORMAL HIGH (ref 78.0–100.0)
Monocytes Absolute: 0.7 10*3/uL (ref 0.1–1.0)
Monocytes Relative: 11.1 % (ref 3.0–12.0)
Neutro Abs: 3.7 10*3/uL (ref 1.4–7.7)
Neutrophils Relative %: 60.3 % (ref 43.0–77.0)
PLATELETS: 204 10*3/uL (ref 150.0–400.0)
RBC: 4.05 Mil/uL — ABNORMAL LOW (ref 4.22–5.81)
RDW: 13.2 % (ref 11.5–14.6)
WBC: 6.1 10*3/uL (ref 4.5–10.5)

## 2013-05-16 LAB — TSH: TSH: 2.23 u[IU]/mL (ref 0.35–5.50)

## 2013-05-16 LAB — BASIC METABOLIC PANEL
BUN: 17 mg/dL (ref 6–23)
CHLORIDE: 99 meq/L (ref 96–112)
CO2: 27 mEq/L (ref 19–32)
Calcium: 9.2 mg/dL (ref 8.4–10.5)
Creatinine, Ser: 1.1 mg/dL (ref 0.4–1.5)
GFR: 71.13 mL/min (ref >60.00)
Glucose, Bld: 86 mg/dL (ref 70–99)
Potassium: 4.3 mEq/L (ref 3.5–5.1)
Sodium: 134 mEq/L — ABNORMAL LOW (ref 135–145)

## 2013-05-16 LAB — LIPID PANEL
Cholesterol: 181 mg/dL (ref 0–200)
HDL: 51.9 mg/dL
LDL Cholesterol: 119 mg/dL — ABNORMAL HIGH (ref 0–99)
Total CHOL/HDL Ratio: 3
Triglycerides: 49 mg/dL (ref 0.0–149.0)
VLDL: 9.8 mg/dL (ref 0.0–40.0)

## 2013-05-16 LAB — PSA: PSA: 0.49 ng/mL (ref 0.10–4.00)

## 2013-05-26 ENCOUNTER — Other Ambulatory Visit: Payer: Self-pay | Admitting: Family Medicine

## 2013-05-26 ENCOUNTER — Ambulatory Visit (INDEPENDENT_AMBULATORY_CARE_PROVIDER_SITE_OTHER): Payer: BC Managed Care – PPO | Admitting: Family Medicine

## 2013-05-26 ENCOUNTER — Encounter: Payer: Self-pay | Admitting: Family Medicine

## 2013-05-26 VITALS — BP 110/80 | Temp 98.7°F | Ht 72.25 in | Wt 188.0 lb

## 2013-05-26 DIAGNOSIS — E785 Hyperlipidemia, unspecified: Secondary | ICD-10-CM

## 2013-05-26 DIAGNOSIS — N529 Male erectile dysfunction, unspecified: Secondary | ICD-10-CM

## 2013-05-26 DIAGNOSIS — L659 Nonscarring hair loss, unspecified: Secondary | ICD-10-CM

## 2013-05-26 DIAGNOSIS — B009 Herpesviral infection, unspecified: Secondary | ICD-10-CM

## 2013-05-26 MED ORDER — SILDENAFIL CITRATE 100 MG PO TABS: 100.0000 mg | ORAL_TABLET | ORAL | Status: DC | PRN

## 2013-05-26 MED ORDER — SIMVASTATIN 40 MG PO TABS: 40.0000 mg | ORAL_TABLET | Freq: Every day | ORAL | Status: DC

## 2013-05-26 MED ORDER — ACYCLOVIR 800 MG PO TABS: ORAL_TABLET | ORAL | Status: DC

## 2013-05-26 MED ORDER — FINASTERIDE 5 MG PO TABS: ORAL_TABLET | ORAL | Status: DC

## 2013-05-26 NOTE — Progress Notes (Signed)
   Subjective:    Patient ID: Steven Byrd, male    DOB: 03-10-1955, 58 y.o.   MRN: [id number]  HPI Steven Byrd is a 58 year old married male nonsmoker who comes in today for general physical examination  He takes acyclovir he milligrams 3 times a day when necessary  He takes 5 mg of Valium when necessary at bedtime for sleep  He takes Proscar 5 mg daily for to prevent hair loss and Viagra 100 mg when necessary for ED and Zocor 40 mg daily for hyperlipidemia  He gets routine eye care, dental care, colonoscopy and GI, vaccinations up-to-date  He is extremely physically active.   Review of Systems  Constitutional: Negative.   HENT: Negative.   Eyes: Negative.   Respiratory: Negative.   Cardiovascular: Negative.   Gastrointestinal: Negative.   Genitourinary: Negative.   Musculoskeletal: Negative.   Skin: Negative.   Neurological: Negative.   Psychiatric/Behavioral: Negative.        Objective:   Physical Exam  Nursing note and vitals reviewed. Constitutional: He is oriented to person, place, and time. He appears well-developed and well-nourished.  HENT:  Head: Normocephalic and atraumatic.  Right Ear: External ear normal.  Left Ear: External ear normal.  Nose: Nose normal.  Mouth/Throat: Oropharynx is clear and moist.  Eyes: Conjunctivae and EOM are normal. Pupils are equal, round, and reactive to light.  Neck: Normal range of motion. Neck supple. No JVD present. No tracheal deviation present. No thyromegaly present.  Cardiovascular: Normal rate, regular rhythm, normal heart sounds and intact distal pulses.  Exam reveals no gallop and no friction rub.   No murmur heard. Pulmonary/Chest: Effort normal and breath sounds normal. No stridor. No respiratory distress. He has no wheezes. He has no rales. He exhibits no tenderness.  Abdominal: Soft. Bowel sounds are normal. He exhibits no distension and no mass. There is no tenderness. There is no rebound and no guarding.    Genitourinary: Rectum normal, prostate normal and penis normal. Guaiac negative stool. No penile tenderness.  Musculoskeletal: Normal range of motion. He exhibits no edema and no tenderness.  Lymphadenopathy:    He has no cervical adenopathy.  Neurological: He is alert and oriented to person, place, and time. He has normal reflexes. No cranial nerve deficit. He exhibits normal muscle tone.  Skin: Skin is warm and dry. No rash noted. No erythema. No pallor.  Total body skin exam looks normal. 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  Now using sunscreens  Psychiatric: He has a normal mood and affect. His behavior is normal. Judgment and thought content normal.          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

## 2013-05-26 NOTE — Patient Instructions (Signed)
[email or web address]  Continue your other medications  Take an aspirin tablet one daily  Return in one year sooner if any problems  Continue to wear the sunscreens

## 2013-05-26 NOTE — Progress Notes (Signed)
Pre visit review using our clinic review tool, if applicable. No additional management support is needed unless otherwise documented below in the visit note. 

## 2013-05-27 ENCOUNTER — Telehealth: Payer: Self-pay | Admitting: *Deleted

## 2013-05-27 NOTE — Telephone Encounter (Signed)
Patient forgot to have Dr Sherren Mocha look at the glass in his foot at his physical.  Would you like for me to schedule him for an office visit?

## 2013-05-28 NOTE — Telephone Encounter (Signed)
Left message on machine for patient to call and schedule an appointment with Dr Sherren Mocha per Dr Sherren Mocha

## 2013-06-18 ENCOUNTER — Ambulatory Visit (INDEPENDENT_AMBULATORY_CARE_PROVIDER_SITE_OTHER): Payer: BC Managed Care – PPO | Admitting: Podiatry

## 2013-06-18 ENCOUNTER — Ambulatory Visit (INDEPENDENT_AMBULATORY_CARE_PROVIDER_SITE_OTHER): Payer: BC Managed Care – PPO

## 2013-06-18 ENCOUNTER — Encounter: Payer: Self-pay | Admitting: Podiatry

## 2013-06-18 VITALS — BP 124/73 | HR 80 | Resp 16

## 2013-06-18 DIAGNOSIS — M216X9 Other acquired deformities of unspecified foot: Secondary | ICD-10-CM

## 2013-06-18 DIAGNOSIS — M779 Enthesopathy, unspecified: Secondary | ICD-10-CM

## 2013-06-18 NOTE — Progress Notes (Signed)
   Subjective:    Patient ID: Steven Byrd, male    DOB: 10-21-1955, 58 y.o.   MRN: [id number]  HPI Comments: "I might have something in my foot"  Patient c/o aching plantar forefoot right for about 6 months. Remembers stepping on a broken wine glass. There is a callused area. 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 Tried digging into the skin but couldn't find anything.  Foot Pain      Review of Systems  All other systems reviewed and are negative.      Objective:   Physical Exam        Assessment & Plan:

## 2013-06-18 NOTE — Progress Notes (Signed)
Subjective:     Patient ID: Steven Byrd, male   DOB: [birth year], 58 y.o.   MRN: [id number]  Foot Pain   patient presents stating I'm having a lot of pain in my forefoot right if him on my foot a lot and I am very active in sports and hiking and golf   Review of Systems  All other systems reviewed and are negative.      Objective:   Physical Exam  Nursing note and vitals reviewed. Constitutional: He is oriented to person, place, and time.  Cardiovascular: Intact distal pulses.   Musculoskeletal: Normal range of motion.  Neurological: He is oriented to person, place, and time.  Skin: Skin is warm.   neurovascular status was intact with muscle strength adequate and range of motion the subtalar midtarsal joint within normal limits. Patient is found to have plantar flexion of the third metatarsal right with mild keratotic tissue formation and no indications of foreign body reaction. Fill time good arch height within normal limits    Assessment:     Plantarflexed metatarsal with capsulitis and inflammation around the third MPJ right foot    Plan:     H&P read view x-rays reviewed. Discussed with patient the consequences a plantarflexed metatarsal and recommended orthotics to reduce stress against the metatarsal patient wants to have these made and is scanned for customized orthotic devices which will be fabricated and return to him in 2 weeks. Reappoint for me to recheck in 6 weeks

## 2013-06-23 ENCOUNTER — Other Ambulatory Visit: Payer: Self-pay | Admitting: Family Medicine

## 2013-07-14 ENCOUNTER — Ambulatory Visit: Payer: BC Managed Care – PPO | Admitting: *Deleted

## 2013-07-14 DIAGNOSIS — M779 Enthesopathy, unspecified: Secondary | ICD-10-CM

## 2013-07-14 NOTE — Patient Instructions (Signed)

## 2013-07-14 NOTE — Progress Notes (Signed)
   Subjective:    Patient ID: Steven Byrd, male    DOB: Sep 27, 1955, 58 y.o.   MRN: [id number]  HPI PICK UP ORTHOTICS AND GIVEN INSTRUCTION.    Review of Systems     Objective:   Physical Exam        Assessment & Plan:

## 2013-07-21 ENCOUNTER — Other Ambulatory Visit: Payer: Self-pay | Admitting: Family Medicine

## 2013-08-11 ENCOUNTER — Ambulatory Visit: Payer: BC Managed Care – PPO | Admitting: Podiatry

## 2013-09-17 ENCOUNTER — Encounter: Payer: Self-pay | Admitting: Internal Medicine

## 2013-09-19 ENCOUNTER — Other Ambulatory Visit: Payer: Self-pay | Admitting: Family Medicine

## 2013-09-29 ENCOUNTER — Emergency Department (HOSPITAL_COMMUNITY)
Admission: EM | Admit: 2013-09-29 | Discharge: 2013-09-29 | Disposition: A | Discharge status: Home / Self Care | Payer: BC Managed Care – PPO | Attending: Emergency Medicine | Admitting: Emergency Medicine

## 2013-09-29 ENCOUNTER — Ambulatory Visit (INDEPENDENT_AMBULATORY_CARE_PROVIDER_SITE_OTHER): Payer: BC Managed Care – PPO | Admitting: Family Medicine

## 2013-09-29 ENCOUNTER — Telehealth: Payer: Self-pay | Admitting: Family Medicine

## 2013-09-29 ENCOUNTER — Encounter: Payer: Self-pay | Admitting: Family Medicine

## 2013-09-29 ENCOUNTER — Encounter (HOSPITAL_COMMUNITY): Payer: Self-pay | Admitting: Emergency Medicine

## 2013-09-29 VITALS — BP 100/78 | HR 144 | Temp 98.0°F | Ht 72.25 in | Wt 187.0 lb

## 2013-09-29 DIAGNOSIS — E785 Hyperlipidemia, unspecified: Secondary | ICD-10-CM | POA: Diagnosis present

## 2013-09-29 DIAGNOSIS — I483 Typical atrial flutter: Secondary | ICD-10-CM | POA: Diagnosis present

## 2013-09-29 DIAGNOSIS — Z87891 Personal history of nicotine dependence: Secondary | ICD-10-CM | POA: Insufficient documentation

## 2013-09-29 DIAGNOSIS — R002 Palpitations: Secondary | ICD-10-CM | POA: Insufficient documentation

## 2013-09-29 DIAGNOSIS — R000 Tachycardia, unspecified: Secondary | ICD-10-CM | POA: Insufficient documentation

## 2013-09-29 DIAGNOSIS — I4892 Unspecified atrial flutter: Secondary | ICD-10-CM

## 2013-09-29 DIAGNOSIS — Z79899 Other long term (current) drug therapy: Secondary | ICD-10-CM | POA: Insufficient documentation

## 2013-09-29 HISTORY — DX: Hyperlipidemia, unspecified: E78.5

## 2013-09-29 LAB — BASIC METABOLIC PANEL
Anion gap: 13 (ref 5–15)
BUN: 21 mg/dL (ref 6–23)
CO2: 24 mEq/L (ref 19–32)
Calcium: 9.3 mg/dL (ref 8.4–10.5)
Chloride: 103 mEq/L (ref 96–112)
Creatinine, Ser: 1.21 mg/dL (ref 0.50–1.35)
GFR calc Af Amer: 73 mL/min — ABNORMAL LOW (ref >90)
GFR, EST NON AFRICAN AMERICAN: 63 mL/min — AB (ref >90)
Glucose, Bld: 99 mg/dL (ref 70–99)
POTASSIUM: 4.5 meq/L (ref 3.7–5.3)
SODIUM: 140 meq/L (ref 137–147)

## 2013-09-29 LAB — I-STAT TROPONIN, ED: TROPONIN I, POC: 0.01 ng/mL (ref 0.00–0.08)

## 2013-09-29 LAB — CBC WITH DIFFERENTIAL/PLATELET
BASOS PCT: 0 % (ref 0–1)
Basophils Absolute: 0 10*3/uL (ref 0.0–0.1)
EOS ABS: 0.1 10*3/uL (ref 0.0–0.7)
Eosinophils Relative: 1 % (ref 0–5)
HCT: 42 % (ref 39.0–52.0)
Hemoglobin: 14.6 g/dL (ref 13.0–17.0)
Lymphocytes Relative: 30 % (ref 12–46)
Lymphs Abs: 2.4 10*3/uL (ref 0.7–4.0)
MCH: 34.5 pg — AB (ref 26.0–34.0)
MCHC: 34.8 g/dL (ref 30.0–36.0)
MCV: 99.3 fL (ref 78.0–100.0)
Monocytes Absolute: 0.9 10*3/uL (ref 0.1–1.0)
Monocytes Relative: 12 % (ref 3–12)
NEUTROS PCT: 57 % (ref 43–77)
Neutro Abs: 4.6 10*3/uL (ref 1.7–7.7)
PLATELETS: 229 10*3/uL (ref 150–400)
RBC: 4.23 MIL/uL (ref 4.22–5.81)
RDW: 13 % (ref 11.5–15.5)
WBC: 8 10*3/uL (ref 4.0–10.5)

## 2013-09-29 MED ORDER — ADENOSINE 6 MG/2ML IV SOLN
6.0000 mg | Freq: Once | INTRAVENOUS | Status: AC
  Administered 2013-09-29: 6 mg via INTRAVENOUS
  Filled 2013-09-29: qty 2

## 2013-09-29 MED ORDER — DILTIAZEM HCL 25 MG/5ML IV SOLN
15.0000 mg | Freq: Once | INTRAVENOUS | Status: AC
  Administered 2013-09-29: 15 mg via INTRAVENOUS
  Filled 2013-09-29: qty 5

## 2013-09-29 MED ORDER — DILTIAZEM HCL 30 MG PO TABS: 30.0000 mg | ORAL_TABLET | Freq: Four times a day (QID) | ORAL | Status: DC | PRN

## 2013-09-29 MED ORDER — DILTIAZEM HCL 30 MG PO TABS
30.0000 mg | ORAL_TABLET | Freq: Four times a day (QID) | ORAL | Status: DC | PRN
  Filled 2013-09-29: qty 1

## 2013-09-29 MED ORDER — DILTIAZEM HCL 100 MG IV SOLR
5.0000 mg/h | Freq: Once | INTRAVENOUS | Status: AC
  Administered 2013-09-29: 5 mg/h via INTRAVENOUS

## 2013-09-29 NOTE — ED Notes (Signed)
Adenosine given. Pt tolerated well.

## 2013-09-29 NOTE — Progress Notes (Signed)
Pre visit review using our clinic review tool, if applicable. No additional management support is needed unless otherwise documented below in the visit note. 

## 2013-09-29 NOTE — Consult Note (Signed)
CONSULTATION NOTE  Reason for Consult: Atrial flutter  Requesting Physician: Dr. Vanita Panda  Cardiologist: None (NEW)  HPI: This is a 58 y.o. male with a past medical history significant for hyperlipidemia.  He has been having recurrent palpitations over the past month, but worse in the past 2 days. He denies chest pain or dyspnea. He has been able to exercise without much difficulty, however, he noted his HR was higher than normal. Typically his resting HR is in the 50-60 range. He went to his PCP who referred him to the ER for an SVT.  He was noted to be in 2:1 atrial flutter in the ER.  He was given adenosine in the ER which caused a pause, but did not slow his rhythm. He was then given 15 mg IV cardizem and eventually converted back to sinus rhythm.  His CHADSVASC score is 0.  Cardiology is asked to evaluate.  PMHx:  Past Medical History  Diagnosis Date  . Dyslipidemia    History reviewed. No pertinent past surgical history.  FAMHx: Family History  Problem Relation Age of Onset  . Family history unknown: Yes    SOCHx:  reports that he has quit smoking. He does not have any smokeless tobacco history on file. He reports that he drinks alcohol. He reports that he does not use illicit drugs.  ALLERGIES: No Known Allergies  ROS: A comprehensive review of systems was negative except for: Cardiovascular: positive for palpitations  HOME MEDICATIONS: No current facility-administered medications on file prior to encounter.   Current Outpatient Prescriptions on File Prior to Encounter  Medication Sig Dispense Refill  . simvastatin (ZOCOR) 40 MG tablet Take 1 tablet (40 mg total) by mouth at bedtime.  90 tablet  3  . sildenafil (VIAGRA) 100 MG tablet Take 1 tablet (100 mg total) by mouth as needed for erectile dysfunction.  10 tablet  10    HOSPITAL MEDICATIONS: I have reviewed the patient's current medications.  VITALS: Blood pressure 121/83, pulse 59, resp. rate 21,  SpO2 99.00%.  PHYSICAL EXAM: General appearance: alert and no distress Neck: no carotid bruit and no JVD Lungs: clear to auscultation bilaterally Heart: regular rate and rhythm, S1, S2 normal, no murmur, click, rub or gallop Abdomen: soft, non-tender; bowel sounds normal; no masses,  no organomegaly Extremities: extremities normal, atraumatic, no cyanosis or edema Pulses: 2+ and symmetric Skin: Skin color, texture, turgor normal. No rashes or lesions Neurologic: Grossly normal Psych: Normal  LABS: Results for orders placed during the hospital encounter of 09/29/13 (from the past 48 hour(s))  CBC WITH DIFFERENTIAL     Status: Abnormal   Collection Time    09/29/13  4:59 PM      Result Value Ref Range   WBC 8.0  4.0 - 10.5 K/uL   RBC 4.23  4.22 - 5.81 MIL/uL   Hemoglobin 14.6  13.0 - 17.0 g/dL   HCT 42.0  39.0 - 52.0 %   MCV 99.3  78.0 - 100.0 fL   MCH 34.5 (*) 26.0 - 34.0 pg   MCHC 34.8  30.0 - 36.0 g/dL   RDW 13.0  11.5 - 15.5 %   Platelets 229  150 - 400 K/uL   Neutrophils Relative % 57  43 - 77 %   Neutro Abs 4.6  1.7 - 7.7 K/uL   Lymphocytes Relative 30  12 - 46 %   Lymphs Abs 2.4  0.7 - 4.0 K/uL   Monocytes Relative 12  3 -  12 %   Monocytes Absolute 0.9  0.1 - 1.0 K/uL   Eosinophils Relative 1  0 - 5 %   Eosinophils Absolute 0.1  0.0 - 0.7 K/uL   Basophils Relative 0  0 - 1 %   Basophils Absolute 0.0  0.0 - 0.1 K/uL  BASIC METABOLIC PANEL     Status: Abnormal   Collection Time    09/29/13  4:59 PM      Result Value Ref Range   Sodium 140  137 - 147 mEq/L   Potassium 4.5  3.7 - 5.3 mEq/L   Chloride 103  96 - 112 mEq/L   CO2 24  19 - 32 mEq/L   Glucose, Bld 99  70 - 99 mg/dL   BUN 21  6 - 23 mg/dL   Creatinine, Ser 1.21  0.50 - 1.35 mg/dL   Calcium 9.3  8.4 - 10.5 mg/dL   GFR calc non Af Amer 63 (*) >90 mL/min   GFR calc Af Amer 73 (*) >90 mL/min   Comment: (NOTE)     The eGFR has been calculated using the CKD EPI equation.     This calculation has not been  validated in all clinical situations.     eGFR's persistently <90 mL/min signify possible Chronic Kidney     Disease.   Anion gap 13  5 - 15  I-STAT TROPOININ, ED     Status: None   Collection Time    09/29/13  5:02 PM      Result Value Ref Range   Troponin i, poc 0.01  0.00 - 0.08 ng/mL   Comment 3            Comment: Due to the release kinetics of cTnI,     a negative result within the first hours     of the onset of symptoms does not rule out     myocardial infarction with certainty.     If myocardial infarction is still suspected,     repeat the test at appropriate intervals.    IMAGING: No results found.  HOSPITAL DIAGNOSES: Principal Problem:   Typical atrial flutter Active Problems:   HYPERLIPIDEMIA   IMPRESSION: 1. Typical atrial flutter  RECOMMENDATION: 1. Steven Byrd presents with sustained atrial flutter with 2:1 conduction. He converted after he was given IV cardizem. He has a low CHADSVASC score of 0. He takes aspirin 81 mg daily. I would recommend continuing this. We discussed several management options and he does not want to take daily medication. We also talked about a pocket-pill strategy with an antiarrythmic and he wishes to stick with prn cardizem. I told him there is a good chance this may come back. I also presented a-fib ablation as an option, which he said he would consider if it returns.  I would be happy to see him in the office as a follow-up appointment. He should get an echocardiogram to r/o structural heart disease as well.  Thanks for consulting me.  Time Spent Directly with Patient: 30 minutes  Pixie Casino, MD, [facility] Attending Cardiologist [facility]  HILTY,Kenneth C 09/29/2013, 6:52 PM

## 2013-09-29 NOTE — Telephone Encounter (Signed)
Noted  

## 2013-09-29 NOTE — Progress Notes (Signed)
No chief complaint on file.   HPI:  Acute visit for:  Palpitations or racing heart: -reports: started about 1 month ago, occurs almost every day and worsening, occurs randomly throughout the day at rest - and last for a few minutes -SOB with activity and even with stairs -denies: CP, jaw pain, nausea, chest pressure, swelling, dizziness, syncope -PMH sig for HLD and anxiety and panic attacks  - but this was situational a few years ago and now does not take any medications  ROS: See pertinent positives and negatives per HPI.  No past medical history on file.  No past surgical history on file.  No family history on file.  History   Social History  . Marital Status: Married    Spouse Name: N/A    Number of Children: N/A  . Years of Education: N/A   Social History Main Topics  . Smoking status: Former Research scientist (life sciences)  . Smokeless tobacco: None  . Alcohol Use: None  . Drug Use: None  . Sexual Activity: None   Other Topics Concern  . None   Social History Narrative  . None    Current outpatient prescriptions:acyclovir (ZOVIRAX) 800 MG tablet, TAKE 1 TABLET 3 TIMES A DAY AS NEEDED, Disp: 90 tablet, Rfl: 0;  diazepam (VALIUM) 5 MG tablet, TAKE 1 TABLET AT BEDTIME AS NEEDED **NEED OFFICE VISIT, Disp: 30 tablet, Rfl: 5;  finasteride (PROSCAR) 5 MG tablet, TAKE 1/4 OF A TABLET DAILY, Disp: 30 tablet, Rfl: 3 sildenafil (VIAGRA) 100 MG tablet, Take 1 tablet (100 mg total) by mouth as needed for erectile dysfunction., Disp: 10 tablet, Rfl: 10;  simvastatin (ZOCOR) 40 MG tablet, Take 1 tablet (40 mg total) by mouth at bedtime., Disp: 90 tablet, Rfl: 3  EXAM:  Filed Vitals:   09/29/13 1544  BP: 100/78  Pulse: 144  Temp: 98 F (36.7 C)    Body mass index is 25.19 kg/(m^2).  GENERAL: vitals reviewed and listed above, alert, oriented, appears well hydrated and in no acute distress  HEENT: atraumatic, conjunttiva clear, no obvious abnormalities on inspection of external nose and  ears  NECK: no obvious masses on inspection  LUNGS: clear to auscultation bilaterally, no wheezes, rales or rhonchi, good air movement  CV: HRRR, no peripheral edema  MS: moves all extremities without noticeable abnormality  PSYCH: pleasant and cooperative, no obvious depression or anxiety  ASSESSMENT AND PLAN:  Discussed the following assessment and plan:  Palpitations - Plan: EKG 12-Lead  -worsening palpitation, racing heart with DOE and SOB -EKG narrow complex SVT with heart rate in 150s, suspect A. Flutter - advised eval in ED, he declined EMS transport, assistant to notify ED staff -Patient advised to return or notify a doctor immediately if symptoms worsen or persist or new concerns arise.  There are no Patient Instructions on file for this visit.   Colin Benton R.

## 2013-09-29 NOTE — ED Notes (Signed)
Patient presents today from PCP office for further evaluation of palpitations x 2 days. Patient noted to be in SVT at PCP office, patient currently has HR in 140's in triage and reports feeling weak and short of breath. Denies chest pain.

## 2013-09-29 NOTE — ED Provider Notes (Signed)
CSN: [id number]     Arrival date & time 09/29/13  1641 History   First MD Initiated Contact with Patient 09/29/13 1651     Chief Complaint  Patient presents with  . Palpitations     (Consider location/radiation/quality/duration/timing/severity/associated sxs/prior Treatment) Patient is a 58 y.o. male presenting with palpitations. The history is provided by the patient and medical records.  Palpitations  This is a 58 year old male with past medical history significant for hyperlipidemia, presenting to the ED for palpitations. Patient states has been experiencing palpitations for the past month, but have gotten progressively worse over the past 2 days. Denies any associated chest pain, shortness of breath, dizziness, syncope, diaphoresis, nausea, or vomiting.  Patient exercises frequently, usually 7 days/weeks.  Today he rode stationary bike, lifted weight, and rowed on rowing machine which is nothing out of the ordinary for him but did feel his HR was higher than normal.  States resting heart rate is 50-60.  Went to PCP office and thought to be in SVT so send to ED.  Pt denies prior cardiac hx.  Former smoker.    History reviewed. No pertinent past medical history. History reviewed. No pertinent past surgical history. No family history on file. History  Substance Use Topics  . Smoking status: Former Research scientist (life sciences)  . Smokeless tobacco: Not on file  . Alcohol Use: Yes    Review of Systems  Cardiovascular: Positive for palpitations.  All other systems reviewed and are negative.     Allergies  Review of patient's allergies indicates no known allergies.  Home Medications   Prior to Admission medications   Medication Sig Start Date End Date Taking? Authorizing Provider  acyclovir (ZOVIRAX) 800 MG tablet TAKE 1 TABLET 3 TIMES A DAY AS NEEDED 05/26/13   Dorena Cookey, MD  diazepam (VALIUM) 5 MG tablet TAKE 1 TABLET AT BEDTIME AS NEEDED **NEED OFFICE VISIT    Dorena Cookey, MD  finasteride  (PROSCAR) 5 MG tablet TAKE 1/4 OF A TABLET DAILY 05/26/13   Dorena Cookey, MD  sildenafil (VIAGRA) 100 MG tablet Take 1 tablet (100 mg total) by mouth as needed for erectile dysfunction. 05/26/13   Dorena Cookey, MD  simvastatin (ZOCOR) 40 MG tablet Take 1 tablet (40 mg total) by mouth at bedtime. 05/26/13   Dorena Cookey, MD   BP 107/88  Pulse 142  Resp 18  SpO2 100%  Physical Exam  Nursing note and vitals reviewed. Constitutional: He is oriented to person, place, and time. He appears well-developed and well-nourished. No distress.  HENT:  Head: Normocephalic and atraumatic.  Mouth/Throat: Oropharynx is clear and moist.  Eyes: Conjunctivae and EOM are normal. Pupils are equal, round, and reactive to light.  Neck: Normal range of motion. Neck supple.  Cardiovascular: Regular rhythm and normal heart sounds.  Tachycardia present.   Pulmonary/Chest: Effort normal and breath sounds normal. No respiratory distress. He has no wheezes.  Abdominal: Soft. Bowel sounds are normal.  Musculoskeletal: Normal range of motion. He exhibits no edema.  Neurological: He is alert and oriented to person, place, and time.  Skin: Skin is warm and dry. He is not diaphoretic.  Psychiatric: He has a normal mood and affect.    ED Course  Procedures (including critical care time)  CRITICAL CARE Performed by: Larene Pickett   Total critical care time: 45  Critical care time was exclusive of separately billable procedures and treating other patients.  Critical care was necessary to treat or prevent  imminent or life-threatening deterioration.  Critical care was time spent personally by me on the following activities: development of treatment plan with patient and/or surrogate as well as nursing, discussions with consultants, evaluation of patient's response to treatment, examination of patient, obtaining history from patient or surrogate, ordering and performing treatments and interventions, ordering and  review of laboratory studies, ordering and review of radiographic studies, pulse oximetry and re-evaluation of patient's condition.   Labs Review Labs Reviewed  CBC WITH DIFFERENTIAL - Abnormal; Notable for the following:    MCH 34.5 (*)    All other components within normal limits  BASIC METABOLIC PANEL - Abnormal; Notable for the following:    GFR calc non Af Amer 63 (*)    GFR calc Af Amer 73 (*)    All other components within normal limits  I-STAT TROPOININ, ED    Imaging Review No results found.   EKG Interpretation   Date/Time:  Monday September 29 2013 16:49:30 EDT Ventricular Rate:  148 PR Interval:    QRS Duration: 108 QT Interval:  364 QTC Calculation: 571 R Axis:   73 Text Interpretation:  Supraventricular tachycardia Nonspecific ST and T  wave abnormality Abnormal ECG Supraventricular tachycardia versus aflutter  2:1 ST-t wave abnormality Abnormal ekg Confirmed by Carmin Muskrat  MD  [id number]) on 09/29/2013 4:59:27 PM      MDM   Final diagnoses:  Typical atrial flutter   58 year old male found to be in SVT vs atrial flutter on arrival to ED.  Pt currently in NAD, largely asx of sx, VS stable.  Explained process of adenosine for conversion back to NSR, pt elected to procede.  Pt placed on zoll pads, crash cart at bedside.  6mg  adenosine given, brief pause however no change in rhythm, VS remained stable, pt tolerated well.  Given cardizem bolus and started on drip with conversion to NSR, rate in low 60's which is his baseline.  Lab work is reassuring.  Pt has no prior cardiac hx, RF including hyperlipidemia and feel is relatively low risk however given new onset of arrythmia, will consult with cardiology.  VS remain stable at this time.  Pt was evaluated by cardiology, Dr. Debara Pickett, who feels appropriate to discharge home with office follow-up, start cardizem 30mg  PO Q6H PRN.  Discussed plan with patient, he/she acknowledged understanding and agreed with plan of care.   Return precautions given for new or worsening symptoms.  Larene Pickett, PA-C 09/29/13 2053

## 2013-09-29 NOTE — Telephone Encounter (Signed)
Patient Information:  Caller Name: [name]  Phone: [phone]  Patient: Steven Byrd, Steven Byrd  Gender: Male  DOB: 05-09-1955  Age: 58 Years  PCP: Stevie K[facility])  Office Follow Up:  Does the office need to follow up with this patient?: No  Instructions For The Office: N/A   Symptoms  Reason For Call & Symptoms: Patient calling about palpitations.  He states he feels he is having irregular heartbeats related to use of Viagra.  He states heart rate as high as 120 in past few days.  Emergent symptoms ruled out.  See Today in Office per Heart Rate and Heartbeat Questions guideline duet to Skippedor extra beat and increases with exercise or exertion.  Reviewed Health History In EMR: Yes  Reviewed Medications In EMR: Yes  Reviewed Allergies In EMR: Yes  Reviewed Surgeries / Procedures: Yes  Date of Onset of Symptoms: 09/19/2013  Guideline(s) Used:  Heart Rate and Heartbeat Questions  Disposition Per Guideline:   See Today in Office  Reason For Disposition Reached:   Skipped or extra beat(s) and increases with exercise or exertion  Advice Given:  Avoid Caffeine  Avoid caffeine-containing beverages (Reason: caffeine is a stimulant and can aggravate palpitations).  Examples of caffeine-containing beverages include coffee, tea, colas, Mountain Dew, [employer], and some energy drinks.  Call Back If:  Chest pain, lightheadedness, or difficulty breathing occurs  Heart beating more than 130 beats / minute  More than 3 extra or skipped beats / minute  You become worse.  Patient Will Follow Care Advice:  YES  Appointment Scheduled:  09/29/2013 15:30:00 Appointment Scheduled Provider:  Maudie Mercury (TEXT 1st, after 20 mins can call), Jarrett Soho [facility])

## 2013-09-29 NOTE — ED Notes (Signed)
[place]d [place], [place], Elyn Peers RN, Oliver Pila RN and Ernestene Mention at bedside. Pts rate 148. Pt on pacer pads. AO x4. Pt denies any pain. AO x4.

## 2013-09-29 NOTE — Discharge Instructions (Signed)
Follow-up with Dr. Debara Pickett. Return to the ED for new or worsening symptoms.

## 2013-09-30 NOTE — ED Provider Notes (Signed)
  This was a shared visit with a mid-level provided (NP or PA).  Throughout the patient's course I was available for consultation/collaboration.    On my exam the patient was in no pain, though he did describe the palpitations that have been occurring with frequency over the past weeks, including during exertion earlier today. Patient's initial evaluation was immediately noticeable for tachycardia, with a rate in the 150 range on monitor, and EKG initially showed SVT, versus atrial flutter. Patient's blood pressure was appropriate, and patient received adenosine.  During a brief pause, patient had visible flutter waves on monitoring equipment.  This was well tolerated.  Subsequently, the patient was started on diltiazem, first bolus and drip.  After provision of the diltiazem the patient had conversion to sinus rhythm, and symptoms were gone. Patient's labs were largely reassuring.  After patient remained hemodynamically stable, with no arrhythmia, we discussed his case with our cardiology team. Cardiology saw the patient, and after several hours of monitoring, with no decompensation, no recurrence of his arrhythmia, he was discharged in stable condition to followup with cardiology.  Second EKG shows sinus rhythm, rate 64, unremarkable   CRITICAL CARE Performed by: Carmin Muskrat Total critical care time: 35 Critical care time was exclusive of separately billable procedures and treating other patients. Critical care was necessary to treat or prevent imminent or life-threatening deterioration. Critical care was time spent personally by me on the following activities: development of treatment plan with patient and/or surrogate as well as nursing, discussions with consultants, evaluation of patient's response to treatment, examination of patient, obtaining history from patient or surrogate, ordering and performing treatments and interventions, ordering and review of laboratory studies, ordering and  review of radiographic studies, pulse oximetry and re-evaluation of patient's condition.       Carmin Muskrat, MD 09/30/13 [phone]

## 2013-10-01 ENCOUNTER — Telehealth: Payer: Self-pay | Admitting: *Deleted

## 2013-10-01 ENCOUNTER — Encounter: Payer: Self-pay | Admitting: Internal Medicine

## 2013-10-01 DIAGNOSIS — I4892 Unspecified atrial flutter: Secondary | ICD-10-CM

## 2013-10-01 NOTE — Telephone Encounter (Signed)
Patient notified that he needs echo prior to OV with Dr. Debara Pickett. Advised him that we cancel appmt for tomorrow and schedule echo first, then follow up. Patient is agreeable. Echo ordered and message sent to schedulers.

## 2013-10-01 NOTE — Telephone Encounter (Signed)
Called patient and left VM informing him that Dr. Debara Pickett wants him to have echo (atrial flutter) and f/up. Asked that patient return call.

## 2013-10-02 ENCOUNTER — Telehealth (HOSPITAL_COMMUNITY): Payer: Self-pay | Admitting: *Deleted

## 2013-10-02 ENCOUNTER — Ambulatory Visit: Payer: BC Managed Care – PPO | Admitting: Internal Medicine

## 2013-10-06 ENCOUNTER — Telehealth: Payer: Self-pay | Admitting: Internal Medicine

## 2013-10-06 MED ORDER — DILTIAZEM HCL 120 MG PO TABS: 120.0000 mg | ORAL_TABLET | Freq: Three times a day (TID) | ORAL | Status: DC

## 2013-10-06 NOTE — Telephone Encounter (Signed)
Pt was in the hospital last Monday with atrial fib. He have had two episodes this week-end and he is taking his medicine.Please call him.

## 2013-10-06 NOTE — Telephone Encounter (Signed)
Rx was sent to pharmacy electronically per Dr. Loletha Grayer  Patient notified.   We is working the [employer] the day of his appointment and needs to move it. Will notify scheduler.

## 2013-10-06 NOTE — Telephone Encounter (Signed)
Returned call to patient. Was in [place] over weekend and his heart went out of rhythm for about 12 hours. He was consulted on in ED by Dr. Debara Pickett for A-flutter and converted to SR with IV dilt. He was given Rx for diltiazem q6h prn, which he used all weekend. He reports that last night around 1:50am he woke up and HR was 150bpm that lasted a few hours and he went back to sleep.   His current rhythm is regular and NOT fast (by palpating pulse, per patient)  He denies chest pain, shortness of breath, lightheadedness, dizziness.  He reports that when his HR is fast, he does get a little winded when going up stairs.   He has an echo scheduled for 8/6 (per Dr. Debara Pickett) and will be following up with him (appmt to be scheduled)  Will defer to DOD, Dr. Sallyanne Kuster to advise.

## 2013-10-06 NOTE — Telephone Encounter (Signed)
Would recommend that he take scheduled diltiazem, 120 mg tid until his follow up visit

## 2013-10-07 ENCOUNTER — Telehealth: Payer: Self-pay | Admitting: Internal Medicine

## 2013-10-07 NOTE — Telephone Encounter (Signed)
Closed encounter °

## 2013-10-09 ENCOUNTER — Ambulatory Visit (HOSPITAL_COMMUNITY)
Admission: RE | Admit: 2013-10-09 | Discharge: 2013-10-09 | Disposition: A | Discharge status: Home / Self Care | Payer: BC Managed Care – PPO | Source: Ambulatory Visit | Attending: Internal Medicine | Admitting: Internal Medicine

## 2013-10-09 DIAGNOSIS — I4892 Unspecified atrial flutter: Secondary | ICD-10-CM

## 2013-10-09 NOTE — Telephone Encounter (Signed)
Closed encounter °

## 2013-10-09 NOTE — Progress Notes (Signed)
2D Echocardiogram Complete.  10/09/2013   Kaloni Bisaillon, RDCS

## 2013-10-15 ENCOUNTER — Ambulatory Visit (INDEPENDENT_AMBULATORY_CARE_PROVIDER_SITE_OTHER): Payer: BC Managed Care – PPO | Admitting: Internal Medicine

## 2013-10-15 ENCOUNTER — Encounter: Payer: Self-pay | Admitting: Internal Medicine

## 2013-10-15 VITALS — BP 130/72 | HR 55 | Ht 73.0 in | Wt 187.3 lb

## 2013-10-15 DIAGNOSIS — I4892 Unspecified atrial flutter: Secondary | ICD-10-CM

## 2013-10-15 DIAGNOSIS — R253 Fasciculation: Secondary | ICD-10-CM

## 2013-10-15 DIAGNOSIS — R0681 Apnea, not elsewhere classified: Secondary | ICD-10-CM | POA: Insufficient documentation

## 2013-10-15 DIAGNOSIS — E785 Hyperlipidemia, unspecified: Secondary | ICD-10-CM

## 2013-10-15 DIAGNOSIS — R0989 Other specified symptoms and signs involving the circulatory and respiratory systems: Secondary | ICD-10-CM

## 2013-10-15 DIAGNOSIS — R0609 Other forms of dyspnea: Secondary | ICD-10-CM

## 2013-10-15 DIAGNOSIS — I483 Typical atrial flutter: Secondary | ICD-10-CM

## 2013-10-15 DIAGNOSIS — R0683 Snoring: Secondary | ICD-10-CM | POA: Insufficient documentation

## 2013-10-15 DIAGNOSIS — I499 Cardiac arrhythmia, unspecified: Secondary | ICD-10-CM

## 2013-10-15 DIAGNOSIS — R259 Unspecified abnormal involuntary movements: Secondary | ICD-10-CM

## 2013-10-15 MED ORDER — DILTIAZEM HCL ER COATED BEADS 240 MG PO TB24: 240.0000 mg | ORAL_TABLET | Freq: Every day | ORAL | Status: DC

## 2013-10-15 NOTE — Patient Instructions (Signed)
Your physician has recommended you make the following change in your medication: START diltiazem 240mg  once daily (long acting)  Your physician has recommended that you have a sleep study. This test records several body functions during sleep, including: brain activity, eye movement, oxygen and carbon dioxide blood levels, heart rate and rhythm, breathing rate and rhythm, the flow of air through your mouth and nose, snoring, body muscle movements, and chest and belly movement. ** done at [facility] physician recommends that you schedule a follow-up appointment in: 3 months.

## 2013-10-15 NOTE — Progress Notes (Signed)
OFFICE NOTE  Chief Complaint:  Hospital followup  Primary Care Physician: Joycelyn Man, MD  HPI:  MARGARET STAGGS is a 58 y.o. male with a past medical history significant for hyperlipidemia. He has been having recurrent palpitations over the past month, but worse in the past 2 days. He denies chest pain or dyspnea. He has been able to exercise without much difficulty, however, he noted his HR was higher than normal. Typically his resting HR is in the 50-60 range. He went to his PCP who referred him to the ER for an SVT. He was noted to be in 2:1 atrial flutter in the ER. He was given adenosine in the ER which caused a pause, but did not slow his rhythm. He was then given 15 mg IV cardizem and eventually converted back to sinus rhythm. His CHADSVASC score is 0. At discharge I recommended he stay on aspirin and start on long-acting Cardizem. He said that he wished to only take medication as needed. I warned him that he may have recurrent atrial flutter and affect did have a couple of episodes. He called the office and was eventually placed on Cardizem 120 mg 3 times a day. He's been taking this dose and has noted no recurrence of his atrial flutter. He had several questions today about management of atrial flutter and is pretty clearly is not what take medications for long periods of time. In addition he reports that in the past he's had an informal sleep study and underwent a uvulopalatoplasty, but still reports that he is told that he snores, stops breathing and may very well have apnea. Of course this may be a risk factor for his atrial flutter. He did undergo an echocardiogram which showed a normal EF, normal wall motion and normal diastolic function. Wall thickness is also normal. Chamber sizes are normal. There was a top normal descending aortic diameter. Otherwise agree benign echocardiogram. As mentioned in my hospital consult note, he is very athletic, exercises a number days a week  including long-distance running and cycling and he denies any anginal symptoms.  PMHx:  Past Medical History  Diagnosis Date  . Dyslipidemia     History reviewed. No pertinent past surgical history.  FAMHx:  Family History  Problem Relation Age of Onset  . Diabetes Mother   . Stroke Mother   . Hypertension Father   . Heart attack Maternal Grandfather     SOCHx:   reports that he quit smoking about 15 years ago. He does not have any smokeless tobacco history on file. He reports that he drinks about 7 - 10.5 ounces of alcohol per week. He reports that he does not use illicit drugs.  ALLERGIES:  No Known Allergies  ROS: A comprehensive review of systems was negative.  HOME MEDS: Current Outpatient Prescriptions  Medication Sig Dispense Refill  . ACYCLOVIR PO Take by mouth as needed.      [name] aspirin 81 MG chewable tablet Chew 81 mg by mouth daily.      [name] CREATINE PO Take 1 tablet by mouth daily.      . Cyanocobalamin (VITAMIN B 12 PO) Take 1,000 mg by mouth daily.       . diazepam (VALIUM) 5 MG tablet Take 5 mg by mouth at bedtime as needed for anxiety (sleep).      . finasteride (PROSCAR) 5 MG tablet Take 1.25 mg by mouth daily.      . Pyridoxine HCl (VITAMIN B-6 PO) Take  100 mg by mouth daily.       . sildenafil (VIAGRA) 100 MG tablet Take 1 tablet (100 mg total) by mouth as needed for erectile dysfunction.  10 tablet  10  . simvastatin (ZOCOR) 40 MG tablet Take 1 tablet (40 mg total) by mouth at bedtime.  90 tablet  3  . diltiazem (CARDIZEM LA) 240 MG 24 hr tablet Take 1 tablet (240 mg total) by mouth daily.  30 tablet  6   No current facility-administered medications for this visit.    LABS/IMAGING: No results found for this or any previous visit (from the past 48 hour(s)). No results found.  VITALS: BP 130/72  Pulse 55  Ht 6\' 1"  (1.854 m)  Wt 187 lb 4.8 oz (84.959 kg)  BMI 24.72 kg/m2  EXAM: General appearance: alert and no distress Neck: no carotid bruit and  no JVD Lungs: clear to auscultation bilaterally Heart: regular rate and rhythm, S1, S2 normal, no murmur, click, rub or gallop Abdomen: soft, non-tender; bowel sounds normal; no masses,  no organomegaly Extremities: extremities normal, atraumatic, no cyanosis or edema Pulses: 2+ and symmetric Skin: Skin color, texture, turgor normal. No rashes or lesions Neurologic: Grossly normal Psych: Normal  EKG: Sinus bradycardia 55  ASSESSMENT: 1. Typical atrial flutter 2. Possible OSA  PLAN: 1.   Mr. Dansereau may have underlying sleep apnea which is a risk factor for his arrhythmias. He is requesting a sleep study and I think that's reasonable based on the fact that he does have loud snoring and has been noted to stop breathing at times. He denies any daytime fatigue or somnolence and has a low sleepiness score, but nonetheless this could be putting him at risk for arrhythmias. He is currently on high-dose Cardizem and I would like to simplify his regimen to go onto once a day dosing. I think he will be good control on a lower dose I recommend a Cardizem LA 240 mg daily.  If his sleep study is abnormal hopefully fitting him with a CPAP device will help reduce his risk of recurrence and then he wishes to try to wean down his Cardizem. If this study does not show apnea he may wish to come off of his medications. I told him it is likely that his flutter will recur. At this point he is low risk for stroke and can maintain on aspirin. He also inquired about ablation. He asked if I knew anyone at [facility]. I did recommend Dr. Bea Laura, who is the head of the electrophysiology Department and was an attending of mine as a resident in [place].  Plan for followup in 3 months.  Pixie Casino, MD, [facility] Attending Cardiologist [facility]  Selina Tapper C 10/15/2013, 5:56 PM

## 2013-10-22 ENCOUNTER — Ambulatory Visit (AMBULATORY_SURGERY_CENTER): Payer: Self-pay | Admitting: *Deleted

## 2013-10-22 VITALS — Ht 72.5 in | Wt 190.6 lb

## 2013-10-22 DIAGNOSIS — Z1211 Encounter for screening for malignant neoplasm of colon: Secondary | ICD-10-CM

## 2013-10-22 MED ORDER — MOVIPREP 100 G PO SOLR: ORAL | Status: DC

## 2013-10-22 NOTE — Progress Notes (Signed)
No allergies to eggs or soy. No problems with anesthesia.  Pt given Emmi instructions for colonoscopy  No oxygen use  No diet drug use  

## 2013-10-24 ENCOUNTER — Encounter: Payer: Self-pay | Admitting: Internal Medicine

## 2013-10-24 ENCOUNTER — Ambulatory Visit: Payer: BC Managed Care – PPO | Admitting: Internal Medicine

## 2013-10-24 ENCOUNTER — Other Ambulatory Visit: Payer: Self-pay | Admitting: Family Medicine

## 2013-10-27 ENCOUNTER — Telehealth: Payer: Self-pay | Admitting: Internal Medicine

## 2013-10-27 NOTE — Telephone Encounter (Signed)
Forwarded to Dr. Debara Pickett as Juluis Rainier

## 2013-10-27 NOTE — Telephone Encounter (Signed)
Pt. Wanted you to know he was in a-fibb this morning but is now out of a-fibb

## 2013-10-27 NOTE — Telephone Encounter (Signed)
Pt has atrial fib this morning,but he went back in rhythm. He just wanted Dr Debara Pickett to be aware of this.

## 2013-11-03 ENCOUNTER — Ambulatory Visit: Payer: BC Managed Care – PPO | Admitting: Internal Medicine

## 2013-11-03 NOTE — Telephone Encounter (Signed)
Ok .. If it gets worse, he may need to be on anti-arrhythmic therapy. He had also asked about an EP consultation at [facility]. I'm happy to refer him for this if he wishes.  Dr. Lemmie Evens

## 2013-11-04 NOTE — Telephone Encounter (Signed)
Returned call to patient. Provided him with advice from Dr. Debara Pickett instructing him to monitor AF, frequency, severity and informed him of options of anti-arrhythmic treatment or EP referral/eval.   Patient reports AF episodes about 2-3x a week, with episodes occuring most often right before bed or in middle of night lasting a few hours. He reports his HR is up to 150bpm during episodes.   He notes that caffeine may be a trigger for AF, but would like to know if there are other triggers he should be mindful of. He reports he exercises and does not noticed himself going into AF.   Would also like to see if sleep study scheduled for Sept. 30th can be moved up. Message sent to scheduler to see if this test date can be scheduled sooner.

## 2013-11-12 ENCOUNTER — Ambulatory Visit (AMBULATORY_SURGERY_CENTER): Payer: BC Managed Care – PPO | Admitting: Internal Medicine

## 2013-11-12 ENCOUNTER — Encounter: Payer: Self-pay | Admitting: Internal Medicine

## 2013-11-12 VITALS — BP 112/73 | HR 53 | Temp 98.1°F | Resp 13 | Ht 72.5 in | Wt 190.0 lb

## 2013-11-12 DIAGNOSIS — Z1211 Encounter for screening for malignant neoplasm of colon: Secondary | ICD-10-CM

## 2013-11-12 MED ORDER — SODIUM CHLORIDE 0.9 % IV SOLN: 500.0000 mL | INTRAVENOUS | Status: DC

## 2013-11-12 NOTE — Patient Instructions (Signed)
YOU HAD AN ENDOSCOPIC PROCEDURE TODAY AT THE [facility]: Refer to the procedure report that was given to you for any specific questions about what was found during the examination.  If the procedure report does not answer your questions, please call your gastroenterologist to clarify.  If you requested that your care partner not be given the details of your procedure findings, then the procedure report has been included in a sealed envelope for you to review at your convenience later.  YOU SHOULD EXPECT: Some feelings of bloating in the abdomen. Passage of more gas than usual.  Walking can help get rid of the air that was put into your GI tract during the procedure and reduce the bloating. If you had a lower endoscopy (such as a colonoscopy or flexible sigmoidoscopy) you may notice spotting of blood in your stool or on the toilet paper. If you underwent a bowel prep for your procedure, then you may not have a normal bowel movement for a few days.  DIET: Your first meal following the procedure should be a light meal and then it is ok to progress to your normal diet.  A half-sandwich or bowl of soup is an example of a good first meal.  Heavy or fried foods are harder to digest and may make you feel nauseous or bloated.  Likewise meals heavy in dairy and vegetables can cause extra gas to form and this can also increase the bloating.  Drink plenty of fluids but you should avoid alcoholic beverages for 24 hours.  ACTIVITY: Your care partner should take you home directly after the procedure.  You should plan to take it easy, moving slowly for the rest of the day.  You can resume normal activity the day after the procedure however you should NOT DRIVE or use heavy machinery for 24 hours (because of the sedation medicines used during the test).    SYMPTOMS TO REPORT IMMEDIATELY: A gastroenterologist can be reached at any hour.  During normal business hours, 8:30 AM to 5:00 PM Monday through Friday,  call [phone].  After hours and on weekends, please call the GI answering service at [phone] who will take a message and have the physician on call contact you.   Following lower endoscopy (colonoscopy or flexible sigmoidoscopy):  Excessive amounts of blood in the stool  Significant tenderness or worsening of abdominal pains  Swelling of the abdomen that is new, acute  Fever of 100F or higher    FOLLOW UP: If any biopsies were taken you will be contacted by phone or by letter within the next 1-3 weeks.  Call your gastroenterologist if you have not heard about the biopsies in 3 weeks.  Our staff will call the home number listed on your records the next business day following your procedure to check on you and address any questions or concerns that you may have at that time regarding the information given to you following your procedure. This is a courtesy call and so if there is no answer at the home number and we have not heard from you through the emergency physician on call, we will assume that you have returned to your regular daily activities without incident.  SIGNATURES/CONFIDENTIALITY: You and/or your care partner have signed paperwork which will be entered into your electronic medical record.  These signatures attest to the fact that that the information above on your After Visit Summary has been reviewed and is understood.  Full responsibility of the confidentiality   of this discharge information lies with you and/or your care-partner.     

## 2013-11-12 NOTE — Progress Notes (Signed)
A/ox3, pleased with MAC, report to RN 

## 2013-11-12 NOTE — Op Note (Signed)
[place]  [address] [place], [id number]   COLONOSCOPY PROCEDURE REPORT  PATIENT: Steven Byrd, Steven Byrd  MR#: [id number] BIRTHDATE: January 20, 1956 , 58  yrs. old GENDER: Male ENDOSCOPIST: Steven Dragon, MD REFERRED JJ:HERDEYC Delora Fuel, M.D. PROCEDURE DATE:  11/12/2013 PROCEDURE:   Colonoscopy, screening First Screening Colonoscopy - Avg.  risk and is 50 yrs.  old or older - No.  Prior Negative Screening - Now for repeat screening. 10 or more years since last screening  History of Adenoma - Now for follow-up colonoscopy & has been > or = to 3 yrs.  N/A  Polyps Removed Today? No.  Recommend repeat exam, <10 yrs? No. ASA CLASS:   Class I INDICATIONS:Average risk patient for colon cancer and pprior exam in September 2005 was normal. MEDICATIONS: MAC sedation, administered by CRNA and propofol (Diprivan) 250mg  IV  DESCRIPTION OF PROCEDURE:   After the risks benefits and alternatives of the procedure were thoroughly explained, informed consent was obtained.  A digital rectal exam revealed no abnormalities of the rectum.   The [id number]  endoscope was introduced through the anus and advanced to the cecum, which was identified by both the appendix and ileocecal valve. No adverse events experienced.   The quality of the prep was excellent, using MoviPrep  The instrument was then slowly withdrawn as the colon was fully examined.      COLON FINDINGS: There was mild scattered diverticulosis noted throughout the entire examined colon.  Retroflexed views revealed no abnormalities. The time to cecum=4 minutes 57 seconds. Withdrawal time=5 minutes 58 seconds.  The scope was withdrawn and the procedure completed. COMPLICATIONS: There were no complications.  ENDOSCOPIC IMPRESSION: There was mild diverticulosis noted throughout the entire examined colon  RECOMMENDATIONS: high-fiber diet Recall colonoscopy in 10 years   eSigned:  [place] Dragon, MD 11/12/2013  3:32 PM   cc:   PATIENT NAME:  Steven Byrd, Steven Byrd MR#: [id number]

## 2013-11-12 NOTE — Progress Notes (Signed)
Waiting on Dr. Brodie 

## 2013-11-12 NOTE — Progress Notes (Signed)
Patient denies any allergies to eggs or soy. Patient denies any problems with anesthesia/sedation. Patient denies any oxygen use at home and does not take any diet/weight loss medications.  

## 2013-11-13 ENCOUNTER — Other Ambulatory Visit: Payer: Self-pay | Admitting: Family Medicine

## 2013-11-13 ENCOUNTER — Telehealth: Payer: Self-pay

## 2013-11-13 NOTE — Telephone Encounter (Signed)
Left message on answering machine. 

## 2013-11-26 ENCOUNTER — Telehealth: Payer: Self-pay | Admitting: Internal Medicine

## 2013-11-26 NOTE — Telephone Encounter (Signed)
Returning your call. °

## 2013-11-26 NOTE — Telephone Encounter (Signed)
LMTCB

## 2013-11-26 NOTE — Telephone Encounter (Signed)
Pt called stating that he is in Afib and is interested in Amiodarone to help with this condition. Please call   Thanks

## 2013-11-26 NOTE — Telephone Encounter (Signed)
Spoke with patient. He reports he went into AF last night - 140-150bpm lasting 1-2 hours. During time of call, patient has been back in normal rhythm. He was wondering if he could take amiodarone on an as needed basis for AF and I informed him this is not a PRN medication. He asked if he can continue normal activities and RN informed him he could be asked if he should notify office when he goes in to AF and I told him it is not necessary until his HR is very fast and sustained or he is symptomatic.   Will defer to Dr. Debara Pickett for further advice, if any.

## 2013-11-26 NOTE — Telephone Encounter (Signed)
Nothing further .. I have offered to discuss antiarrythmic therapy with him or refer him to [facility] per his wishes. He will need an appointment with me if he wishes to discuss further.  Dr. Lemmie Evens

## 2013-11-26 NOTE — Telephone Encounter (Signed)
Informed patient to contact office should he decide he would like to initiate antiarrhythmic therapy. Patient will wait til after sleep study to decide on this.

## 2013-12-03 ENCOUNTER — Ambulatory Visit (HOSPITAL_BASED_OUTPATIENT_CLINIC_OR_DEPARTMENT_OTHER): Payer: BC Managed Care – PPO | Attending: Internal Medicine

## 2013-12-03 VITALS — Ht 73.0 in | Wt 185.0 lb

## 2013-12-03 DIAGNOSIS — I499 Cardiac arrhythmia, unspecified: Secondary | ICD-10-CM

## 2013-12-03 DIAGNOSIS — R0681 Apnea, not elsewhere classified: Secondary | ICD-10-CM

## 2013-12-03 DIAGNOSIS — R0683 Snoring: Secondary | ICD-10-CM

## 2013-12-03 DIAGNOSIS — G4733 Obstructive sleep apnea (adult) (pediatric): Secondary | ICD-10-CM | POA: Insufficient documentation

## 2013-12-10 DIAGNOSIS — G4733 Obstructive sleep apnea (adult) (pediatric): Secondary | ICD-10-CM

## 2013-12-10 NOTE — Sleep Study (Signed)
   NAME: Steven Byrd DATE OF BIRTH:  [birth year] MEDICAL RECORD NUMBER [id number]  LOCATION: [facility]  PHYSICIAN: Kathee Delton  DATE OF STUDY: 12/03/2013  SLEEP STUDY TYPE: Nocturnal Polysomnogram               REFERRING PHYSICIAN: Pixie Casino., MD  INDICATION FOR STUDY: Hypersomnia with sleep apnea  EPWORTH SLEEPINESS SCORE:  11 HEIGHT: 6\' 1"  (185.4 cm)  WEIGHT: 185 lb (83.915 kg)    Body mass index is 24.41 kg/(m^2).  NECK SIZE: 16 in.  MEDICATIONS: Reviewed in the sleep record  SLEEP ARCHITECTURE: The patient had a total sleep time of 291 minutes with no slow-wave sleep and only 48 minutes of REM. Sleep onset latency was normal at 5 minutes, and REM onset was normal at 72 minutes. Sleep efficiency was moderately reduced at 77%.  RESPIRATORY DATA: The patient was found to have no obstructive apneas and 74 obstructive hypopneas, giving him an AHI of 15 events per hour. The events occurred primarily in the supine position, and there was moderate snoring noted throughout. The patient did not meet split-night protocol secondary to the majority of his events occurring well after 2 AM.  OXYGEN DATA: There was oxygen desaturation as low as 84% with the patient's obstructive events  CARDIAC DATA: Occasional PAC and PVC noted  MOVEMENT/PARASOMNIA: The patient had no significant limb movements or other abnormal behaviors noted.  IMPRESSION/ RECOMMENDATION:    1) mild to moderate obstructive sleep apnea/hypopnea syndrome, with an AHI of 15 events per hour and oxygen desaturation as low as 84%. Treatment for this degree of sleep apnea can include a trial of weight loss alone, upper airway surgery, dental appliance, and also CPAP. Clinical correlation is suggested.  2) occasional PAC and PVC noted, but no clinically significant arrhythmias were seen.    [place], American Board of Sleep Medicine  ELECTRONICALLY SIGNED ON:  12/10/2013,  5:37 PM [place] PH: [phone]   FX: [phone] [place]

## 2013-12-15 ENCOUNTER — Other Ambulatory Visit: Payer: Self-pay | Admitting: Family Medicine

## 2013-12-23 ENCOUNTER — Telehealth: Payer: Self-pay | Admitting: Internal Medicine

## 2013-12-24 NOTE — Telephone Encounter (Signed)
Close encounter 

## 2013-12-25 ENCOUNTER — Telehealth: Payer: Self-pay | Admitting: *Deleted

## 2013-12-25 DIAGNOSIS — G4733 Obstructive sleep apnea (adult) (pediatric): Secondary | ICD-10-CM

## 2013-12-25 NOTE — Telephone Encounter (Signed)
Patient would like to set up CPAP titration. This is ordered. Message sent to scheduler to set up

## 2013-12-25 NOTE — Telephone Encounter (Signed)
LMTCB for sleep study results

## 2013-12-26 ENCOUNTER — Ambulatory Visit (HOSPITAL_BASED_OUTPATIENT_CLINIC_OR_DEPARTMENT_OTHER): Payer: BC Managed Care – PPO | Attending: Internal Medicine | Admitting: Radiology

## 2013-12-26 DIAGNOSIS — Z7982 Long term (current) use of aspirin: Secondary | ICD-10-CM | POA: Diagnosis not present

## 2013-12-26 DIAGNOSIS — Z79891 Long term (current) use of opiate analgesic: Secondary | ICD-10-CM | POA: Insufficient documentation

## 2013-12-26 DIAGNOSIS — R0683 Snoring: Secondary | ICD-10-CM | POA: Diagnosis not present

## 2013-12-26 DIAGNOSIS — G4733 Obstructive sleep apnea (adult) (pediatric): Secondary | ICD-10-CM | POA: Diagnosis not present

## 2013-12-26 DIAGNOSIS — Z79899 Other long term (current) drug therapy: Secondary | ICD-10-CM | POA: Insufficient documentation

## 2013-12-29 ENCOUNTER — Telehealth: Payer: Self-pay | Admitting: Internal Medicine

## 2013-12-29 NOTE — Telephone Encounter (Signed)
Patient states he had his second night - titration He states he is very eager to get an machine. He states this may help him -in not developing afib in the morning He is aware DR hilty has not received results as of yet.  patient states he really would like to  start the C- PAP  Prior to office visit on 11/19 and get everything accomplish before the first of the year  RN informed patient will defer to Dr Hilty/ Eliezer Lofts RN The sleep study titration results are not available as of yet

## 2013-12-29 NOTE — Telephone Encounter (Signed)
Pt called in stating that he had a sleep study done and it appears that he slept much better while on the CPAP machine. He is interested in getting one and would like to know how to go about getting one. He stated that he liked the one with nose buds that came with a humidifer . Please call  Thanks

## 2013-12-30 NOTE — Telephone Encounter (Signed)
CPAP titration on 12/26/13. Awaiting results in EPIC

## 2014-01-02 NOTE — Telephone Encounter (Signed)
Can't seem to find the CPAP results - don't they automatically get sent to a home health / DME company so that he can get a CPAP machine?  Dr. Lemmie Evens

## 2014-01-04 DIAGNOSIS — G4733 Obstructive sleep apnea (adult) (pediatric): Secondary | ICD-10-CM

## 2014-01-04 NOTE — Sleep Study (Signed)
   NAME: Steven Byrd DATE OF BIRTH:  13-Feb-1956 MEDICAL RECORD NUMBER [id number]  LOCATION: [facility]  PHYSICIAN: Gerald Honea A  DATE OF STUDY: 12/26/2013  SLEEP STUDY TYPE: Positive Airway Pressure Titration               REFERRING PHYSICIAN: Pixie Casino., MD  INDICATION FOR STUDY: [name] is a 58 year old male who was found to have obstructive sleep apnea on diagnostic polysomnogram with an AHI of 15 per hour and had evidence for moderate snoring.  He presents now for CPAP titration.  EPWORTH SLEEPINESS SCORE:  11 which is consistent with excessive daytime sleepiness HEIGHT:   73 inches WEIGHT:    188 pounds: BMI 24.8 NECK SIZE: 16 in.  MEDICATIONS:  simvastatin (ZOCOR) 40 MG tablet 40 mg, Daily at bedtime sildenafil (VIAGRA) 100 MG tablet 100 mg, As needed Pyridoxine HCl (VITAMIN B-6 PO) 100 mg, Daily finasteride (PROSCAR) 5 MG tablet finasteride (PROSCAR) 5 MG tablet 1.25 mg, Daily diltiazem (CARDIZEM LA) 240 MG 24 hr tablet 240 mg, Daily diazepam (VALIUM) 5 MG tablet Cyanocobalamin (VITAMIN B 12 PO) 1,000 mg, Daily CREATINE PO 1 tablet, Daily aspirin 81 MG chewable tablet 81 mg, Daily ACYCLOVIR    SLEEP ARCHITECTURE: The recording time began at 22:01:19 with lights on at 5:32:49 giving a total recording time of 451.5 minutes.  Total sleep time was 385 minutes giving a percent sleep efficiency of 85.3%.  Sleep onset was 8 minutes.  The patient spent 34 minutes in stage I (8.8%), 269.5 minutes in stage II (70%) 0 minutes in stage III, and 81.5 minutes in rem sleep (21.2%).  Onset to REM sleep was 68 minutes. Sleep architecture is abnormal with reduced stage III sleep.  RESPIRATORY DATA: CPAP titration was initiated at 5 cm and was titrated up to 7 cm water pressure.  AHI at 7 cm pressure was 0.  There was no snoring.  OXYGEN DATA: The mean oxygen saturation was 96.8%.  Overall, 96.6% with NREM sleep, and 97.4% with REM sleep.  The oxygen nadir was  94%, both with NREM and REM sleep.  CARDIAC DATA: The average heart rate was 54%.  MOVEMENT/PARASOMNIA: 416 periodically movements.  The index was 2.5 per hour with 1.6 per hour to arousal.  IMPRESSION/ RECOMMENDATION:  Moderate obstructive sleep apnea with AHI of 15 per hour.  The patient's diagnostic polysomnogram and demonstration of excellent response to CPAP therapy.  Initial recommended pressure is 7 cm water pressure.  Recommend download in 30 days and sleep clinic follow-up evaluation.  [place], American Board of Sleep Medicine  ELECTRONICALLY SIGNED ON:  01/04/2014, 3:25 PM [place] PH: [phone]   FX: [phone] [place]

## 2014-01-05 NOTE — Telephone Encounter (Signed)
Faxed referral for CPAP & supplies to [facility] @ [phone] (sleep study, CPAP titration, OV note referring patient for sleep study, demographics form)

## 2014-01-05 NOTE — Telephone Encounter (Signed)
Patient notified

## 2014-01-11 ENCOUNTER — Other Ambulatory Visit: Payer: Self-pay | Admitting: Family Medicine

## 2014-01-15 ENCOUNTER — Telehealth: Payer: Self-pay | Admitting: *Deleted

## 2014-01-15 NOTE — Telephone Encounter (Signed)
Returned CPAP supply order to [facility] @ [phone]. 

## 2014-01-20 ENCOUNTER — Ambulatory Visit (INDEPENDENT_AMBULATORY_CARE_PROVIDER_SITE_OTHER): Payer: BC Managed Care – PPO | Admitting: Internal Medicine

## 2014-01-20 ENCOUNTER — Encounter: Payer: Self-pay | Admitting: Internal Medicine

## 2014-01-20 VITALS — BP 122/78 | HR 52 | Ht 73.0 in | Wt 181.6 lb

## 2014-01-20 DIAGNOSIS — I483 Typical atrial flutter: Secondary | ICD-10-CM

## 2014-01-20 DIAGNOSIS — R0681 Apnea, not elsewhere classified: Secondary | ICD-10-CM

## 2014-01-20 DIAGNOSIS — Z9989 Dependence on other enabling machines and devices: Secondary | ICD-10-CM

## 2014-01-20 DIAGNOSIS — R0683 Snoring: Secondary | ICD-10-CM

## 2014-01-20 DIAGNOSIS — G4733 Obstructive sleep apnea (adult) (pediatric): Secondary | ICD-10-CM

## 2014-01-20 HISTORY — DX: Obstructive sleep apnea (adult) (pediatric): G47.33

## 2014-01-20 NOTE — Progress Notes (Signed)
OFFICE NOTE  Chief Complaint:  No complaints  Primary Care Physician: Steven Man, MD  HPI:  Steven Byrd is a 58 y.o. male with a past medical history significant for hyperlipidemia. He has been having recurrent palpitations over the past month, but worse in the past 2 days. He denies chest pain or dyspnea. He has been able to exercise without much difficulty, however, he noted his HR was higher than normal. Typically his resting HR is in the 50-60 range. He went to his PCP who referred him to the ER for an SVT. He was noted to be in 2:1 atrial flutter in the ER. He was given adenosine in the ER which caused a pause, but did not slow his rhythm. He was then given 15 mg IV cardizem and eventually converted back to sinus rhythm. His CHADSVASC score is 0. At discharge I recommended he stay on aspirin and start on long-acting Cardizem. He said that he wished to only take medication as needed. I warned him that he may have recurrent atrial flutter and affect did have a couple of episodes. He called the office and was eventually placed on Cardizem 120 mg 3 times a day. He's been taking this dose and has noted no recurrence of his atrial flutter. He had several questions today about management of atrial flutter and is pretty clearly is not what take medications for long periods of time. In addition he reports that in the past he's had an informal sleep study and underwent a uvulopalatoplasty, but still reports that he is told that he snores, stops breathing and may very well have apnea. Of course this may be a risk factor for his atrial flutter. He did undergo an echocardiogram which showed a normal EF, normal wall motion and normal diastolic function. Wall thickness is also normal. Chamber sizes are normal. There was a top normal descending aortic diameter. Otherwise agree benign echocardiogram. As mentioned in my hospital consult note, he is very athletic, exercises a number days a week  including long-distance running and cycling and he denies any anginal symptoms.  Steven Byrd returns today for follow-up. In the interim he underwent a sleep study which demonstrated moderate OSA with an AHI of 15 events per hour. He was started on CPAP just this past week at 7 cm water and has had some improvement in his symptoms. He likes the device and feels very comfortable sleeping in it. He has reported no further palpitations and remains on long-acting Cardizem.  PMHx:  Past Medical History  Diagnosis Date  . Dyslipidemia   . Atrial flutter   . OSA on CPAP 01/20/2014    Past Surgical History  Procedure Laterality Date  . No past surgeries      FAMHx:  Family History  Problem Relation Age of Onset  . Diabetes Mother   . Stroke Mother   . Hypertension Father   . Heart attack Maternal Grandfather   . Colon cancer Neg Hx     SOCHx:   reports that he quit smoking about 15 years ago. He has never used smokeless tobacco. He reports that he drinks about 8.4 - 12.6 oz of alcohol per week. He reports that he does not use illicit drugs.  ALLERGIES:  No Known Allergies  ROS: A comprehensive review of systems was negative.  HOME MEDS: Current Outpatient Prescriptions  Medication Sig Dispense Refill  . acyclovir (ZOVIRAX) 800 MG tablet TAKE 1 TABLET BY MOUTH 3 TIMES DAILY AS NEEDED  90 tablet 2  . ACYCLOVIR PO Take by mouth as needed.    [name] aspirin 81 MG chewable tablet Chew 81 mg by mouth daily.    [name] CREATINE PO Take 1 tablet by mouth daily.    . Cyanocobalamin (VITAMIN B 12 PO) Take 1,000 mg by mouth daily.     . diazepam (VALIUM) 5 MG tablet TAKE 1 TABLET AT BEDTIME AS NEEDED **NEED OFFICE VISIT 30 tablet 5  . diltiazem (CARDIZEM LA) 240 MG 24 hr tablet Take 1 tablet (240 mg total) by mouth daily. 30 tablet 6  . finasteride (PROSCAR) 5 MG tablet Take 1.25 mg by mouth daily.    . finasteride (PROSCAR) 5 MG tablet TAKE 1/4 OF A TABLET DAILY 10 tablet 3  . Pyridoxine HCl  (VITAMIN B-6 PO) Take 100 mg by mouth daily.     . sildenafil (VIAGRA) 100 MG tablet Take 1 tablet (100 mg total) by mouth as needed for erectile dysfunction. 10 tablet 10  . simvastatin (ZOCOR) 40 MG tablet Take 1 tablet (40 mg total) by mouth at bedtime. 90 tablet 3   No current facility-administered medications for this visit.    LABS/IMAGING: No results found for this or any previous visit (from the past 48 hour(s)). No results found.  VITALS: BP 122/78 mmHg  Pulse 52  Ht 6\' 1"  (1.854 m)  Wt 181 lb 9.6 oz (82.373 kg)  BMI 23.96 kg/m2  EXAM: defered  EKG: Sinus bradycardia 52  ASSESSMENT: 1. Typical atrial flutter 2. OSA on CPAP  PLAN: 1.   Steven Byrd did indeed have moderate sleep apnea and is doing fairly well now on CPAP. He has had no recurrence of his atrial flutter. He should remain on diltiazem. Overall he feels well and is pleased with his progress so far. Plan to continue his medications and CPAP and I will see him back annually or sooner as necessary.  Steven Casino, MD, [facility] Attending Cardiologist [facility]  Steven Byrd C 01/20/2014, 4:43 PM

## 2014-01-20 NOTE — Patient Instructions (Signed)
Your physician wants you to follow-up in: 1 year with Dr. Hilty. You will receive a reminder letter in the mail two months in advance. If you don't receive a letter, please call our office to schedule the follow-up appointment.  

## 2014-01-27 NOTE — Addendum Note (Signed)
Addended by: Vear Clock on: 01/27/2014 09:17 AM   Modules accepted: Orders

## 2014-02-06 ENCOUNTER — Telehealth: Payer: Self-pay | Admitting: *Deleted

## 2014-02-06 NOTE — Telephone Encounter (Signed)
Faxed CPAP supply order to [facility] @ [phone].

## 2014-03-16 ENCOUNTER — Ambulatory Visit (INDEPENDENT_AMBULATORY_CARE_PROVIDER_SITE_OTHER): Payer: BLUE CROSS/BLUE SHIELD | Admitting: Cardiovascular Disease

## 2014-03-16 ENCOUNTER — Other Ambulatory Visit: Payer: Self-pay | Admitting: Family Medicine

## 2014-03-16 ENCOUNTER — Encounter: Payer: Self-pay | Admitting: Cardiovascular Disease

## 2014-03-16 VITALS — BP 126/90 | HR 67 | Ht 73.0 in | Wt 184.0 lb

## 2014-03-16 DIAGNOSIS — Z9989 Dependence on other enabling machines and devices: Principal | ICD-10-CM

## 2014-03-16 DIAGNOSIS — E785 Hyperlipidemia, unspecified: Secondary | ICD-10-CM

## 2014-03-16 DIAGNOSIS — G4733 Obstructive sleep apnea (adult) (pediatric): Secondary | ICD-10-CM

## 2014-03-16 DIAGNOSIS — I483 Typical atrial flutter: Secondary | ICD-10-CM

## 2014-03-16 NOTE — Patient Instructions (Signed)
Your physician recommends that you schedule a follow-up appointment as needed for sleep with Dr. Claiborne Billings.

## 2014-03-16 NOTE — Progress Notes (Signed)
Patient ID: Steven Byrd, male   DOB: 21-Feb-1956, 59 y.o.   MRN: [id number]     HPI: Steven Byrd is a 59 y.o. male who presents to sleep clinic after initiation of CPAP therapy for obstructive sleep apnea.  Steven Byrd has a remote history of sleep apnea and apparently underwent UPPP surgery and approximate 1990.  He never used CPAP therapy.  He has a history of paroxysmal atrial flutter fib/flutter and is followed by Dr. Debara Pickett for cardiology care..  He has a history of hyperlipidemia.  Recently, he had noticed increased snoring.  He was referred for a diagnostic polysomnogram on 12/03/2013 which was interpreted by Dr. Gwenette Greet and confirmed moderate sleep apnea with an AHI at 15 per hour.  Oxygen desaturation was 84%.  He was noted have occasional PACs and PVCs.  He was referred for a CPAP titration, which was interpreted by me in 12/26/2013.  He was titrated up to 770 to water pressure with excellent benefit.  Since initiating CPAP therapy.  He has felt significantly improved.  A download was obtained from 04/10/2013 through 12/07/2014 which shows excellent compliance with 97% of days with usage and 87% of days with greater than 4 hours.  He is averaging 6 hours and 30 minutes per night.  AHI was excellent at 1.0 on his 7 cm water pressure.  Since initiating CPAP therapy.  He is unaware of any breakthrough palpitations or atrial fibrillation.  He has more energy.  He denies residual deep sleepiness.  There is no snoring.   Epworth Sleepiness Scale: Situation   Chance of Dozing/Sleeping (0 = never , 1 = slight chance , 2 = moderate chance , 3 = high chance )   sitting and reading 1   watching TV 2   sitting inactive in a public place 1   being a passenger in a motor vehicle for an hour or more 2   lying down in the afternoon 3   sitting and talking to someone 0   sitting quietly after lunch (no alcohol) 0   while stopped for a few minutes in traffic as the driver 0   Total  Score  9    Past Medical History  Diagnosis Date  . Dyslipidemia   . Atrial flutter   . OSA on CPAP 01/20/2014    Past Surgical History  Procedure Laterality Date  . No past surgeries     UPPP surgery ~1990  No Known Allergies  Current Outpatient Prescriptions  Medication Sig Dispense Refill  . acyclovir (ZOVIRAX) 800 MG tablet TAKE 1 TABLET BY MOUTH 3 TIMES DAILY AS NEEDED 90 tablet 2  . aspirin 81 MG chewable tablet Chew 81 mg by mouth daily.    [name] CREATINE PO Take 1 tablet by mouth daily.    . Cyanocobalamin (VITAMIN B 12 PO) Take 1,000 mg by mouth daily.     . diazepam (VALIUM) 5 MG tablet TAKE 1 TABLET AT BEDTIME AS NEEDED **NEED OFFICE VISIT 30 tablet 5  . diltiazem (CARDIZEM LA) 240 MG 24 hr tablet Take 1 tablet (240 mg total) by mouth daily. 30 tablet 6  . finasteride (PROSCAR) 5 MG tablet TAKE 1/4 OF A TABLET DAILY 10 tablet 3  . Pyridoxine HCl (VITAMIN B-6 PO) Take 100 mg by mouth daily.     . sildenafil (VIAGRA) 100 MG tablet Take 1 tablet (100 mg total) by mouth as needed for erectile dysfunction. 10 tablet 10  . simvastatin (ZOCOR) 40  MG tablet Take 1 tablet (40 mg total) by mouth at bedtime. 90 tablet 3   No current facility-administered medications for this visit.    History   Social History  . Marital Status: Divorced    Spouse Name: N/A    Number of Children: N/A  . Years of Education: N/A   Occupational History  . Not on file.   Social History Main Topics  . Smoking status: Former Smoker    Quit date: 10/16/1998  . Smokeless tobacco: Never Used  . Alcohol Use: 8.4 - 12.6 oz/week    14-21 Glasses of wine per week     Comment: red wine  . Drug Use: No  . Sexual Activity: Not on file   Other Topics Concern  . Not on file   Social History Narrative    Family history is notable for diabetes and stroke in his mother, hypertension in his 38  maternal grandfather who suffered a heart attack.   ROS General: Negative; No fevers, chills,  or night sweats HEENT: Negative; No changes in vision or hearing, sinus congestion, difficulty swallowing Pulmonary: Negative; No cough, wheezing, shortness of breath, hemoptysis Cardiovascular: Negative; No chest pain, presyncope, syncope, palpatations GI: Negative; No nausea, vomiting, diarrhea, or abdominal pain GU: Negative; No dysuria, hematuria, or difficulty voiding Musculoskeletal: Negative; no myalgias, joint pain, or weakness Hematologic: Negative; no easy bruising, bleeding Endocrine: Negative; no heat/cold intolerance Neuro: Negative; no changes in balance, headaches Skin: Negative; No rashes or skin lesions Psychiatric: Negative; No behavioral problems, depression Sleep: Negative; No daytime sleepiness, hypersomnolence, bruxism, restless legs, hypnogognic hallucinations, no cataplexy   Physical Exam BP 126/90 mmHg  Pulse 67  Ht 6\' 1"  (1.854 m)  Wt 184 lb (83.462 kg)  BMI 24.28 kg/m2  General: Alert, oriented, no distress.  Skin: normal turgor, no rashes HEENT: Normocephalic, atraumatic. Pupils round and reactive; sclera anicteric; extraocular muscles intact; Fundi normal Nose without nasal septal hypertrophy Mouth/Parynx benign; s/p UPPP surgery Neck: No JVD, no carotid briuts Lungs: clear to ausculatation and percussion; no wheezing or rales  Chest wall: No tenderness to palpation Heart: RRR, s1 s2 normal; no ectopy  Abdomen: soft, nontender; no hepatosplenomehaly, BS+; abdominal aorta nontender and not dilated by palpation. Back: No CVA tenderness Pulses 2+ Extremities: no clubbinbg cyanosis or edema, Homan's sign negative  Neurologic: grossly nonfocal; cranial nerves intact. Psychological: Normal affect and mood.    LABS:  BMET    Component Value Date/Time   NA 140 09/29/2013 1659   K 4.5 09/29/2013 1659   CL 103 09/29/2013 1659   CO2 24 09/29/2013 1659   GLUCOSE 99 09/29/2013 1659   BUN 21 09/29/2013 1659   CREATININE 1.21 09/29/2013 1659   CALCIUM  9.3 09/29/2013 1659   GFRNONAA 63* 09/29/2013 1659   GFRAA 73* 09/29/2013 1659     Hepatic Function Panel     Component Value Date/Time   PROT 6.9 05/16/2013 0929   ALBUMIN 4.3 05/16/2013 0929   AST 23 05/16/2013 0929   ALT 23 05/16/2013 0929   ALKPHOS 53 05/16/2013 0929   BILITOT 2.3* 05/16/2013 0929   BILIDIR 0.3 05/16/2013 0929     CBC    Component Value Date/Time   WBC 8.0 09/29/2013 1659   RBC 4.23 09/29/2013 1659   HGB 14.6 09/29/2013 1659   HCT 42.0 09/29/2013 1659   PLT 229 09/29/2013 1659   MCV 99.3 09/29/2013 1659   MCH 34.5* 09/29/2013 1659   MCHC 34.8 09/29/2013 1659  RDW 13.0 09/29/2013 1659   LYMPHSABS 2.4 09/29/2013 1659   MONOABS 0.9 09/29/2013 1659   EOSABS 0.1 09/29/2013 1659   BASOSABS 0.0 09/29/2013 1659     BNP No results found for: PROBNP  Lipid Panel     Component Value Date/Time   CHOL 181 05/16/2013 0929   TRIG 49.0 05/16/2013 0929   HDL 51.90 05/16/2013 0929   CHOLHDL 3 05/16/2013 0929   VLDL 9.8 05/16/2013 0929   LDLCALC 119* 05/16/2013 0929   LDLDIRECT 126.8 02/01/2010 0923     RADIOLOGY: No results found.    ASSESSMENT AND PLAN: Steven Byrd is a 59 year old male with a remote history of mild obstructive sleep apnea who underwent UPPP surgery.  A proximally 25 years ago.  He has developed paroxysmal atrial fibrillation/flutter.  He was recently diagnosed with moderate obstructive sleep apnea and has been on CPAP therapy for the past 6 weeks.  He has noticed significant benefit with therapy.  His sleep is restorative.  He denies snoring.  He denies hypersomnolence.  AHI is excellent at 1.0 on his current download at 7 cm water pressure.  I discussed cleaning issues with reference to his equipment.  He is stable.  He will return to the primary cardiology care of Dr. Debara Pickett.  I'll be available as needed if sleep apnea issues arise.     Troy Sine, MD, [facility]  03/16/2014 5:16 PM

## 2014-03-17 ENCOUNTER — Encounter: Payer: Self-pay | Admitting: Internal Medicine

## 2014-03-18 ENCOUNTER — Other Ambulatory Visit: Payer: Self-pay | Admitting: Family Medicine

## 2014-05-05 ENCOUNTER — Other Ambulatory Visit: Payer: Self-pay | Admitting: Internal Medicine

## 2014-05-05 NOTE — Telephone Encounter (Signed)
Rx refill sent to patient pharmacy   

## 2014-07-13 ENCOUNTER — Other Ambulatory Visit: Payer: Self-pay | Admitting: Family Medicine

## 2014-09-15 ENCOUNTER — Encounter: Payer: Self-pay | Admitting: Internal Medicine

## 2014-09-27 ENCOUNTER — Other Ambulatory Visit: Payer: Self-pay | Admitting: Family Medicine

## 2014-10-21 ENCOUNTER — Other Ambulatory Visit: Payer: Self-pay | Admitting: Family Medicine

## 2014-11-04 ENCOUNTER — Other Ambulatory Visit (INDEPENDENT_AMBULATORY_CARE_PROVIDER_SITE_OTHER): Payer: BLUE CROSS/BLUE SHIELD

## 2014-11-04 DIAGNOSIS — Z0000 Encounter for general adult medical examination without abnormal findings: Secondary | ICD-10-CM

## 2014-11-04 LAB — CBC WITH DIFFERENTIAL/PLATELET
BASOS PCT: 0.7 % (ref 0.0–3.0)
Basophils Absolute: 0 10*3/uL (ref 0.0–0.1)
EOS PCT: 2.7 % (ref 0.0–5.0)
Eosinophils Absolute: 0.1 10*3/uL (ref 0.0–0.7)
HEMATOCRIT: 41.1 % (ref 39.0–52.0)
HEMOGLOBIN: 14.1 g/dL (ref 13.0–17.0)
LYMPHS PCT: 41.4 % (ref 12.0–46.0)
Lymphs Abs: 1.9 10*3/uL (ref 0.7–4.0)
MCHC: 34.2 g/dL (ref 30.0–36.0)
MCV: 101.7 fl — AB (ref 78.0–100.0)
MONOS PCT: 12.8 % — AB (ref 3.0–12.0)
Monocytes Absolute: 0.6 10*3/uL (ref 0.1–1.0)
Neutro Abs: 1.9 10*3/uL (ref 1.4–7.7)
Neutrophils Relative %: 42.4 % — ABNORMAL LOW (ref 43.0–77.0)
Platelets: 187 10*3/uL (ref 150.0–400.0)
RBC: 4.05 Mil/uL — AB (ref 4.22–5.81)
RDW: 13.9 % (ref 11.5–15.5)
WBC: 4.6 10*3/uL (ref 4.0–10.5)

## 2014-11-04 LAB — LIPID PANEL
CHOL/HDL RATIO: 3
Cholesterol: 198 mg/dL (ref 0–200)
HDL: 62.2 mg/dL (ref >39.00)
LDL CALC: 120 mg/dL — AB (ref 0–99)
NONHDL: 135.83
TRIGLYCERIDES: 79 mg/dL (ref 0.0–149.0)
VLDL: 15.8 mg/dL (ref 0.0–40.0)

## 2014-11-04 LAB — BASIC METABOLIC PANEL
BUN: 19 mg/dL (ref 6–23)
CHLORIDE: 99 meq/L (ref 96–112)
CO2: 27 mEq/L (ref 19–32)
Calcium: 9.3 mg/dL (ref 8.4–10.5)
Creatinine, Ser: 1.08 mg/dL (ref 0.40–1.50)
GFR: 73.82 mL/min (ref >60.00)
Glucose, Bld: 86 mg/dL (ref 70–99)
POTASSIUM: 4.1 meq/L (ref 3.5–5.1)
SODIUM: 135 meq/L (ref 135–145)

## 2014-11-04 LAB — POCT URINALYSIS DIPSTICK
BILIRUBIN UA: NEGATIVE
GLUCOSE UA: NEGATIVE
KETONES UA: NEGATIVE
Leukocytes, UA: NEGATIVE
Nitrite, UA: NEGATIVE
PH UA: 6.5
Protein, UA: NEGATIVE
RBC UA: NEGATIVE
Spec Grav, UA: 1.01
Urobilinogen, UA: 0.2

## 2014-11-04 LAB — HEPATIC FUNCTION PANEL
ALT: 34 U/L (ref 0–53)
AST: 40 U/L — AB (ref 0–37)
Albumin: 4.3 g/dL (ref 3.5–5.2)
Alkaline Phosphatase: 45 U/L (ref 39–117)
Bilirubin, Direct: 0.3 mg/dL (ref 0.0–0.3)
TOTAL PROTEIN: 7.5 g/dL (ref 6.0–8.3)
Total Bilirubin: 2.4 mg/dL — ABNORMAL HIGH (ref 0.2–1.2)

## 2014-11-04 LAB — TSH: TSH: 1.83 u[IU]/mL (ref 0.35–4.50)

## 2014-11-04 LAB — PSA: PSA: 0.63 ng/mL (ref 0.10–4.00)

## 2014-11-10 ENCOUNTER — Encounter: Payer: Self-pay | Admitting: Family Medicine

## 2014-11-10 ENCOUNTER — Ambulatory Visit (INDEPENDENT_AMBULATORY_CARE_PROVIDER_SITE_OTHER): Payer: BLUE CROSS/BLUE SHIELD | Admitting: Family Medicine

## 2014-11-10 VITALS — BP 120/84 | Temp 98.7°F | Ht 72.0 in | Wt 194.0 lb

## 2014-11-10 DIAGNOSIS — Z0000 Encounter for general adult medical examination without abnormal findings: Secondary | ICD-10-CM

## 2014-11-10 DIAGNOSIS — Z23 Encounter for immunization: Secondary | ICD-10-CM

## 2014-11-10 DIAGNOSIS — G4733 Obstructive sleep apnea (adult) (pediatric): Secondary | ICD-10-CM

## 2014-11-10 DIAGNOSIS — Z9989 Dependence on other enabling machines and devices: Secondary | ICD-10-CM

## 2014-11-10 DIAGNOSIS — N529 Male erectile dysfunction, unspecified: Secondary | ICD-10-CM

## 2014-11-10 DIAGNOSIS — I483 Typical atrial flutter: Secondary | ICD-10-CM

## 2014-11-10 DIAGNOSIS — E7801 Familial hypercholesterolemia: Secondary | ICD-10-CM

## 2014-11-10 MED ORDER — SIMVASTATIN 40 MG PO TABS: 40.0000 mg | ORAL_TABLET | Freq: Every day | ORAL | Status: DC

## 2014-11-10 MED ORDER — FINASTERIDE 5 MG PO TABS: 5.0000 mg | ORAL_TABLET | Freq: Every day | ORAL | Status: DC

## 2014-11-10 MED ORDER — ACYCLOVIR 800 MG PO TABS: 800.0000 mg | ORAL_TABLET | Freq: Three times a day (TID) | ORAL | Status: DC | PRN

## 2014-11-10 MED ORDER — SILDENAFIL CITRATE 100 MG PO TABS: 100.0000 mg | ORAL_TABLET | ORAL | Status: DC | PRN

## 2014-11-10 MED ORDER — DIAZEPAM 5 MG PO TABS: 5.0000 mg | ORAL_TABLET | Freq: Four times a day (QID) | ORAL | Status: DC | PRN

## 2014-11-10 NOTE — Progress Notes (Signed)
   Subjective:    Patient ID: Steven Byrd, male    DOB: 07-22-1955, 59 y.o.   MRN: [id number]  HPI Rasheen is a 59 year old married male nonsmoker who comes in today for general physical examination because of a history of atrial flutter, BPH, erectile dysfunction mild, hyperlipidemia,  He seen on yearly basis by his cardiologist. He's on diltiazem 250 mg daily. He's currently in sinus rhythm  He also sees his dermatologist on a yearly basis cozy has a history of sun damage. His light skin and light eyes and a lot of sun exposure.  He's got a history of BPH and takes Proscar one quarter tablet daily  He also has erectile dysfunction and uses Viagra when necessary  He has hyperlipidemia takes Zocor 40 mg daily and an aspirin tablet. Lipids are at goal.  He gets routine eye care, dental care, recent colonoscopy normal, vaccinations up-to-date  He's considering going to [place] with his daughter. Asked him to check the CDC website to see what vaccinations he might need updated   Review of Systems  Constitutional: Negative.   HENT: Negative.   Eyes: Negative.   Respiratory: Negative.   Cardiovascular: Negative.   Gastrointestinal: Negative.   Endocrine: Negative.   Genitourinary: Negative.   Musculoskeletal: Negative.   Skin: Negative.   Allergic/Immunologic: Negative.   Neurological: Negative.   Hematological: Negative.   Psychiatric/Behavioral: Negative.        Objective:   Physical Exam  Constitutional: He is oriented to person, place, and time. He appears well-developed and well-nourished.  HENT:  Head: Normocephalic and atraumatic.  Right Ear: External ear normal.  Left Ear: External ear normal.  Nose: Nose normal.  Mouth/Throat: Oropharynx is clear and moist.  Eyes: Conjunctivae and EOM are normal. Pupils are equal, round, and reactive to light.  Neck: Normal range of motion. Neck supple. No JVD present. No tracheal deviation present. No thyromegaly present.    Cardiovascular: Normal rate, regular rhythm, normal heart sounds and intact distal pulses.  Exam reveals no gallop and no friction rub.   No murmur heard. Pulmonary/Chest: Effort normal and breath sounds normal. No stridor. No respiratory distress. He has no wheezes. He has no rales. He exhibits no tenderness.  Abdominal: Soft. Bowel sounds are normal. He exhibits no distension and no mass. There is no tenderness. There is no rebound and no guarding.  Genitourinary: Rectum normal, prostate normal and penis normal. Guaiac negative stool. No penile tenderness.  Musculoskeletal: Normal range of motion. He exhibits no edema or tenderness.  Lymphadenopathy:    He has no cervical adenopathy.  Neurological: He is alert and oriented to person, place, and time. He has normal reflexes. No cranial nerve deficit. He exhibits normal muscle tone.  Skin: Skin is warm and dry. No rash noted. No erythema. No pallor.  Light skin and light eyes and a lot of sun damage no obvious abnormal lesions  Psychiatric: He has a normal mood and affect. His behavior is normal. Judgment and thought content normal.  Nursing note and vitals reviewed.         Assessment & Plan:  Healthy male  History of A. Flutter.......... continue diltiazem 240 mg daily  Erectile dysfunction continue Viagra when necessary  Hyperlipidemia continue Zocor 40 mg, aspirin, and exercise program  History of sleep apnea continue CPAP and follow-up in pulmonary when necessary

## 2014-11-10 NOTE — Patient Instructions (Signed)
Continue your good health habits diet and exercise  Return in one year for general physical examination  [email or web address]  CDC travel website  Kristine Royal hunt M.D......... travel medicine it [place]

## 2014-11-10 NOTE — Progress Notes (Signed)
Pre visit review using our clinic review tool, if applicable. No additional management support is needed unless otherwise documented below in the visit note. 

## 2014-11-27 ENCOUNTER — Ambulatory Visit (INDEPENDENT_AMBULATORY_CARE_PROVIDER_SITE_OTHER): Payer: BLUE CROSS/BLUE SHIELD | Admitting: Internal Medicine

## 2014-11-27 DIAGNOSIS — Z23 Encounter for immunization: Secondary | ICD-10-CM

## 2014-11-27 DIAGNOSIS — Z7189 Other specified counseling: Secondary | ICD-10-CM

## 2014-11-27 DIAGNOSIS — Z9189 Other specified personal risk factors, not elsewhere classified: Secondary | ICD-10-CM | POA: Diagnosis not present

## 2014-11-27 DIAGNOSIS — Z7184 Encounter for health counseling related to travel: Secondary | ICD-10-CM

## 2014-11-27 MED ORDER — AZITHROMYCIN 500 MG PO TABS: 1000.0000 mg | ORAL_TABLET | Freq: Once | ORAL | Status: DC

## 2014-11-27 MED ORDER — ACETAZOLAMIDE 125 MG PO TABS: 125.0000 mg | ORAL_TABLET | Freq: Two times a day (BID) | ORAL | Status: DC

## 2014-11-27 NOTE — Patient Instructions (Signed)
[facility]                [address], [place]                   [place], [place] [address]                      Phone: [phone]                        Fax: [phone]   Planned departure date: November 2016          Planned return date: 10 days Countries of travel: [place]   Guidelines for the Prevention & Treatment of Traveler's Diarrhea  Prevention: "Boil it, Peel it, Lacinda Axon it, or Forget it"   the fewer chances -> lower risk: try to stick to food & water precautions as much as possible"   If it's "piping hot"; it is probably okay, if not, it may not be   Treatment   1) You should always take care to drink lots of fluids in order to avoid dehydration   2) You should bring medications with you in case you come down with a case of diarrhea   3) OTC = bring pepto-bismol - can take with initial abdominal symptoms;                    Imodium - can help slow down your intestinal tract, can help relief cramps                    and diarrhea, can take if no bloody diarrhea  Use azithromycin if needed for traveler's diarrhea  Guidelines for the Prevention of Malaria  Avoidance:  -fewer mosquito bites = lower risk. Mosquitos can bite at night as well as daytime  -cover up (long sleeve clothing), mosquito nets, screens  -Insect repellent for your skin ( DEET containing lotion > 20%): for clothes ( permethrin spray)    not indicated for malaria prevention.   Immunizations received today: Hepatitis A series and Typhoid (parenteral)  Future immunizations, if indicated Hepatitis A series in 6 months   Prior to travel:  1) Be sure to pick up appropriate prescriptions, including medicine you take daily. Do not expect to be able to fill your prescriptions abroad.  2) Strongly consider obtaining traveler's insurance, including emergency evacuation insurance. Most plans in the [place] do not cover participants abroad. (see below for  resources)  3) Register at the appropriate [place] embassy or consulate with travel dates so they are aware of your presence in-country and for helpful advice during travel using the [employer] (STEP, GreenNylon.com.cy).  4) Leave contact information with a relative or friend.  5) Keep a Research officer, political party, credit cards in case they become lost or stolen  6) Inform your credit card company that you will be travelling abroad   During travel:  1) If you become ill and need medical advice, the [place] [employer]	[place] of the country you are traveling in general provides a list of [place] speaking doctors.  We are also available on MyChart for remote consultation if you register prior to travel. 2) Avoid motorcycles or scooters when at all possible. Traffic laws in many countries are lax and accidents occur frequently.  3) Do not take any unnecessary risks that you wouldn't do at home.  Resources:  -Country specific information: BlindResource.ca or GreenNylon.com.cy  -Press photographer (DEET, mosquito nets): [employer], [employer] store, [employer], [place] insurance options: gatewayplans.com; http://clayton-rivera.info/; travelguard.com or Good [employer], gninsurance.com or info@gninsurance .com, (514)737-7687.   Post Travel:  If you return from your trip ill, call your primary care doctor or our travel clinic @ [phone].   Enjoy your trip and know that with proper pre-travel preparation, most people have an enjoyable and uninterrupted trip!

## 2014-11-27 NOTE — Progress Notes (Signed)
Subjective:   SHUNSUKE GRANZOW is a 59 y.o. male who presents to the Infectious Disease clinic for travel consultation. Planned departure date: November 2016          Planned return date: 10 days Countries of travel: [place] Areas in country: urban   Accommodations: private home Purpose of travel: vacation and son-in-law lives there Prior travel out of [place]: yes     Objective:   Medications:Reviewed    Assessment:    No contraindications to travel. none     Plan:    Issues discussed: altitude illness, freshwater swimming, future shots, Japanese encephalitis, MVA safety, rabies, safe food/water, traveler's diarrhea, website/handouts for more information, what to do if ill upon return and what to do if ill while there. Immunizations recommended: Hepatitis A series and Typhoid (parenteral). Malaria prophylaxis: not indicated Traveler's diarrhea prophylaxis: azithromycin. Total duration of visit: 1 Hour. Total time spent on education, counseling, coordination of care: 30 Minutes.

## 2015-02-10 ENCOUNTER — Other Ambulatory Visit: Payer: Self-pay | Admitting: Internal Medicine

## 2015-02-10 NOTE — Telephone Encounter (Signed)
REFILL 

## 2015-03-09 ENCOUNTER — Telehealth: Payer: Self-pay | Admitting: Family Medicine

## 2015-03-09 MED ORDER — CYCLOBENZAPRINE HCL 10 MG PO TABS: 10.0000 mg | ORAL_TABLET | Freq: Every day | ORAL | Status: DC

## 2015-03-09 NOTE — Telephone Encounter (Signed)
Per Dr Sherren Mocha patient should try Motrin 600 twice daily with food.  Rx for flexeril has been sent.  If symptoms do not improve he should schedule an office visit.  Patient agrees and will call back because he wants to make sure its not a kidney infection.  Patient would also like to know which provider he should transfer to.

## 2015-03-09 NOTE — Telephone Encounter (Signed)
Patient states that he had messed up his back again. Patient states that he saw MD about 2 years for this and would like the MD to prescribe whatever medications that was prescribe in the past. Tried to advise patient that he might need to be seen and patient states that he feel safe to say that he does not need appointment.

## 2015-03-10 NOTE — Telephone Encounter (Signed)
Pt has been sch

## 2015-03-10 NOTE — Telephone Encounter (Signed)
Patient request to transfer to [facility].  Okay per Dr Sherren Mocha.

## 2015-04-07 ENCOUNTER — Other Ambulatory Visit: Payer: Self-pay | Admitting: Family Medicine

## 2015-04-29 ENCOUNTER — Encounter: Payer: Self-pay | Admitting: Adult Health

## 2015-04-29 ENCOUNTER — Ambulatory Visit (INDEPENDENT_AMBULATORY_CARE_PROVIDER_SITE_OTHER): Payer: BLUE CROSS/BLUE SHIELD | Admitting: Adult Health

## 2015-04-29 VITALS — BP 124/74 | Temp 99.4°F | Ht 72.0 in | Wt 187.9 lb

## 2015-04-29 DIAGNOSIS — Z7189 Other specified counseling: Secondary | ICD-10-CM

## 2015-04-29 DIAGNOSIS — I483 Typical atrial flutter: Secondary | ICD-10-CM

## 2015-04-29 DIAGNOSIS — E785 Hyperlipidemia, unspecified: Secondary | ICD-10-CM | POA: Diagnosis not present

## 2015-04-29 DIAGNOSIS — Z7689 Persons encountering health services in other specified circumstances: Secondary | ICD-10-CM

## 2015-04-29 NOTE — Progress Notes (Signed)
Pre visit review using our clinic review tool, if applicable. No additional management support is needed unless otherwise documented below in the visit note. 

## 2015-04-29 NOTE — Progress Notes (Signed)
Patient presents to clinic today to establish care.He is a pleasant caucasian male who  has a past medical history of Dyslipidemia; Atrial flutter ([place]); and OSA on CPAP (01/20/2014).   Acute Concerns:  Establish Care   Chronic Issues:  Dyslipidemia.   Atrial Fibrillation  Health Maintenance: Dental -- Goes to the dentist every 3 months  Vision -- Every 6 months  Immunizations -- UTD Colonoscopy --One year ago- 10 year plan  - Works out Monday - Friday - weight lifting or rowing.  - Diet: Eats healthy.  He is followed by Cardiology yearly for hx: atrial flutter  Past Medical History  Diagnosis Date  . Dyslipidemia   . Atrial flutter ([place])   . OSA on CPAP 01/20/2014    Past Surgical History  Procedure Laterality Date  . No past surgeries      Current Outpatient Prescriptions on File Prior to Visit  Medication Sig Dispense Refill  . acetaZOLAMIDE (DIAMOX) 125 MG tablet Take 1 tablet (125 mg total) by mouth 2 (two) times daily. Start 2 days prior to ascent above 9,000 feet and continue for 2-3 days until acclimated or at descent 8 tablet 0  . acyclovir (ZOVIRAX) 800 MG tablet Take 1 tablet (800 mg total) by mouth 3 (three) times daily as needed. 90 tablet 2  . aspirin 81 MG chewable tablet Chew 81 mg by mouth daily.    [name] azithromycin (ZITHROMAX) 500 MG tablet Take 2 tablets (1,000 mg total) by mouth once. Take 2 tabs once for Traveler's diarrhea 4 tablet 0  . CREATINE PO Take 1 tablet by mouth daily.    . Cyanocobalamin (VITAMIN B 12 PO) Take 1,000 mg by mouth daily.     . cyclobenzaprine (FLEXERIL) 10 MG tablet Take 1 tablet (10 mg total) by mouth at bedtime. 30 tablet 0  . diazepam (VALIUM) 5 MG tablet Take 1 tablet (5 mg total) by mouth every 6 (six) hours as needed for anxiety. 30 tablet 5  . diltiazem (MATZIM LA) 240 MG 24 hr tablet Take 1 tablet (240 mg total) by mouth daily. NEED OV. 90 tablet 0  . finasteride (PROSCAR) 5 MG tablet Take 1 tablet (5 mg total) by  mouth daily. 50 tablet 3  . finasteride (PROSCAR) 5 MG tablet TAKE 1/4 OF A TABLET DAILY 10 tablet 3  . Pyridoxine HCl (VITAMIN B-6 PO) Take 100 mg by mouth daily.     . sildenafil (VIAGRA) 100 MG tablet Take 1 tablet (100 mg total) by mouth as needed for erectile dysfunction. 10 tablet 10  . simvastatin (ZOCOR) 40 MG tablet TAKE 1 TABLET AT BEDTIME. **MUST KEEP APP FOR FUTHER REFILLS** 90 tablet 0  . simvastatin (ZOCOR) 40 MG tablet Take 1 tablet (40 mg total) by mouth at bedtime. 90 tablet 3  . VIAGRA 100 MG tablet TAKE 1 TABLET BY MOUTH ONCE AS NEEDED 6 tablet 0   No current facility-administered medications on file prior to visit.    No Known Allergies  Family History  Problem Relation Age of Onset  . Diabetes Mother   . Stroke Mother   . Hypertension Father   . Heart attack Maternal Grandfather   . Colon cancer Neg Hx     Social History   Social History  . Marital Status: Divorced    Spouse Name: N/A  . Number of Children: N/A  . Years of Education: N/A   Occupational History  . Not on file.   Social History Main  Topics  . Smoking status: Former Smoker    Quit date: 10/16/1998  . Smokeless tobacco: Never Used  . Alcohol Use: 8.4 - 12.6 oz/week    14-21 Glasses of wine per week     Comment: red wine  . Drug Use: No  . Sexual Activity: Not on file   Other Topics Concern  . Not on file   Social History Narrative    Review of Systems  Constitutional: Negative.   HENT: Negative.   Eyes: Negative.   Respiratory: Negative.   Cardiovascular: Negative.   Gastrointestinal: Negative.   Genitourinary: Negative.   Musculoskeletal: Negative.   Skin: Negative.   Neurological: Negative.   Endo/Heme/Allergies: Negative.   Psychiatric/Behavioral: Negative.   All other systems reviewed and are negative.   BP 124/74 mmHg  Temp(Src) 99.4 F (37.4 C) (Oral)  Ht 6' (1.829 m)  Wt 187 lb 14.4 oz (85.231 kg)  BMI 25.48 kg/m2  Physical Exam  Constitutional: He is  well-developed, well-nourished, and in no distress. No distress.  HENT:  Head: Normocephalic and atraumatic.  Right Ear: External ear normal.  Left Ear: External ear normal.  Nose: Nose normal.  Mouth/Throat: Oropharynx is clear and moist. No oropharyngeal exudate.  Eyes: Conjunctivae and EOM are normal. Pupils are equal, round, and reactive to light. Right eye exhibits no discharge. Left eye exhibits no discharge. No scleral icterus.  Neck: Normal range of motion. Neck supple. No thyromegaly present.  Cardiovascular: Normal rate, regular rhythm, normal heart sounds and intact distal pulses.  Exam reveals no gallop and no friction rub.   No murmur heard. Pulmonary/Chest: Effort normal and breath sounds normal. No respiratory distress. He has no wheezes. He has no rales. He exhibits no tenderness.  Musculoskeletal: Normal range of motion. He exhibits no edema or tenderness.  Lymphadenopathy:    He has no cervical adenopathy.  Skin: Skin is warm and dry. No rash noted. He is not diaphoretic. No erythema. No pallor.  Psychiatric: Mood, memory, affect and judgment normal.  Nursing note and vitals reviewed.   Assessment/Plan:  1. Typical atrial flutter ([place]) - Followed by Cardiology. Feels as though he has not had any episodes since starting on CPAP  2. Hyperlipidemia LDL goal <70 - Takes Zocor 40 mg   3. Encounter to establish care - Follow up in September for next physical.  - Follow up sooner if needed

## 2015-05-05 ENCOUNTER — Other Ambulatory Visit: Payer: Self-pay | Admitting: Internal Medicine

## 2015-05-06 NOTE — Telephone Encounter (Signed)
REFILL 

## 2015-07-06 DIAGNOSIS — H2513 Age-related nuclear cataract, bilateral: Secondary | ICD-10-CM | POA: Diagnosis not present

## 2015-07-06 DIAGNOSIS — H25013 Cortical age-related cataract, bilateral: Secondary | ICD-10-CM | POA: Diagnosis not present

## 2015-07-06 DIAGNOSIS — H401111 Primary open-angle glaucoma, right eye, mild stage: Secondary | ICD-10-CM | POA: Diagnosis not present

## 2015-07-06 DIAGNOSIS — H401121 Primary open-angle glaucoma, left eye, mild stage: Secondary | ICD-10-CM | POA: Diagnosis not present

## 2015-07-31 ENCOUNTER — Other Ambulatory Visit: Payer: Self-pay | Admitting: Internal Medicine

## 2015-08-03 NOTE — Telephone Encounter (Signed)
Rx has been sent to the pharmacy electronically. ° °

## 2015-08-22 ENCOUNTER — Other Ambulatory Visit: Payer: Self-pay | Admitting: Internal Medicine

## 2015-08-23 NOTE — Telephone Encounter (Signed)
Rx(s) sent to pharmacy electronically.  

## 2015-09-02 DIAGNOSIS — D224 Melanocytic nevi of scalp and neck: Secondary | ICD-10-CM | POA: Diagnosis not present

## 2015-09-02 DIAGNOSIS — D1800 Hemangioma unspecified site: Secondary | ICD-10-CM | POA: Diagnosis not present

## 2015-09-02 DIAGNOSIS — D225 Melanocytic nevi of trunk: Secondary | ICD-10-CM | POA: Diagnosis not present

## 2015-09-02 DIAGNOSIS — Z85828 Personal history of other malignant neoplasm of skin: Secondary | ICD-10-CM | POA: Diagnosis not present

## 2015-09-02 DIAGNOSIS — D485 Neoplasm of uncertain behavior of skin: Secondary | ICD-10-CM | POA: Diagnosis not present

## 2015-09-09 ENCOUNTER — Other Ambulatory Visit: Payer: Self-pay | Admitting: Internal Medicine

## 2015-09-09 ENCOUNTER — Other Ambulatory Visit: Payer: Self-pay | Admitting: *Deleted

## 2015-09-09 MED ORDER — DILTIAZEM HCL ER COATED BEADS 240 MG PO TB24: 240.0000 mg | ORAL_TABLET | Freq: Every day | ORAL | Status: DC

## 2015-09-09 NOTE — Telephone Encounter (Signed)
REFILL 

## 2015-09-21 ENCOUNTER — Other Ambulatory Visit: Payer: Self-pay | Admitting: Internal Medicine

## 2015-09-27 ENCOUNTER — Other Ambulatory Visit: Payer: Self-pay | Admitting: Internal Medicine

## 2015-10-04 ENCOUNTER — Other Ambulatory Visit: Payer: Self-pay | Admitting: Internal Medicine

## 2015-10-05 ENCOUNTER — Ambulatory Visit: Payer: BLUE CROSS/BLUE SHIELD | Admitting: Student

## 2015-10-17 ENCOUNTER — Other Ambulatory Visit: Payer: Self-pay | Admitting: Internal Medicine

## 2015-10-19 ENCOUNTER — Ambulatory Visit (INDEPENDENT_AMBULATORY_CARE_PROVIDER_SITE_OTHER): Payer: BLUE CROSS/BLUE SHIELD | Admitting: Adult Health

## 2015-10-19 ENCOUNTER — Encounter: Payer: Self-pay | Admitting: Adult Health

## 2015-10-19 VITALS — BP 120/88 | Temp 98.6°F | Ht 72.0 in | Wt 190.8 lb

## 2015-10-19 DIAGNOSIS — Z4802 Encounter for removal of sutures: Secondary | ICD-10-CM

## 2015-10-19 NOTE — Progress Notes (Signed)
Subjective:    Patient ID: Steven Byrd, male    DOB: [birth year], 60 y.o.   MRN: [id number]  HPI  60 year old male who presents to the office today for suture removal. Two weeks ago he had a hair transplant done in [place]. He has " 80-100" stiches placed and he needs to have them removed.   He denies any issues and has not had an complications  Review of Systems  Constitutional: Negative.   Skin: Positive for wound.   Past Medical History:  Diagnosis Date  . Atrial flutter ([place])   . Dyslipidemia   . OSA on CPAP 01/20/2014    Social History   Social History  . Marital status: Divorced    Spouse name: N/A  . Number of children: N/A  . Years of education: N/A   Occupational History  . Not on file.   Social History Main Topics  . Smoking status: Former Smoker    Quit date: 10/16/1998  . Smokeless tobacco: Never Used  . Alcohol use 8.4 - 12.6 oz/week    14 - 21 Glasses of wine per week     Comment: red wine  . Drug use: No  . Sexual activity: Not on file   Other Topics Concern  . Not on file   Social History Narrative   Has a Licensed conveyancer company since 1978      Daughter who is 42 and a Son who is 33   - Daughter in [place], Son is in [place].       He likes to hike, scuba dive, and travel    Past Surgical History:  Procedure Laterality Date  . NO PAST SURGERIES      Family History  Problem Relation Age of Onset  . Diabetes Father   . Stroke Father   . Hypertension Mother     ?  [name] Heart attack Maternal Grandfather   . Colon cancer Neg Hx     No Known Allergies  Current Outpatient Prescriptions on File Prior to Visit  Medication Sig Dispense Refill  . acetaZOLAMIDE (DIAMOX) 125 MG tablet Take 1 tablet (125 mg total) by mouth 2 (two) times daily. Start 2 days prior to ascent above 9,000 feet and continue for 2-3 days until acclimated or at descent 8 tablet 0  . acyclovir (ZOVIRAX) 800 MG tablet Take 1 tablet (800 mg total) by mouth 3  (three) times daily as needed. 90 tablet 2  . aspirin 81 MG chewable tablet Chew 81 mg by mouth daily.    [name] azithromycin (ZITHROMAX) 500 MG tablet Take 2 tablets (1,000 mg total) by mouth once. Take 2 tabs once for Traveler's diarrhea 4 tablet 0  . CREATINE PO Take 1 tablet by mouth daily.    . Cyanocobalamin (VITAMIN B 12 PO) Take 1,000 mg by mouth daily.     . cyclobenzaprine (FLEXERIL) 10 MG tablet Take 1 tablet (10 mg total) by mouth at bedtime. 30 tablet 0  . diazepam (VALIUM) 5 MG tablet Take 1 tablet (5 mg total) by mouth every 6 (six) hours as needed for anxiety. 30 tablet 5  . diltiazem (MATZIM LA) 240 MG 24 hr tablet Take 1 tablet (240 mg total) by mouth daily. 30 tablet 0  . finasteride (PROSCAR) 5 MG tablet Take 1 tablet (5 mg total) by mouth daily. 50 tablet 3  . finasteride (PROSCAR) 5 MG tablet TAKE 1/4 OF A TABLET DAILY 10 tablet 3  . Pyridoxine  HCl (VITAMIN B-6 PO) Take 100 mg by mouth daily.     . sildenafil (VIAGRA) 100 MG tablet Take 1 tablet (100 mg total) by mouth as needed for erectile dysfunction. 10 tablet 10  . simvastatin (ZOCOR) 40 MG tablet TAKE 1 TABLET AT BEDTIME. **MUST KEEP APP FOR FUTHER REFILLS** 90 tablet 0  . simvastatin (ZOCOR) 40 MG tablet Take 1 tablet (40 mg total) by mouth at bedtime. 90 tablet 3  . VIAGRA 100 MG tablet TAKE 1 TABLET BY MOUTH ONCE AS NEEDED 6 tablet 0   No current facility-administered medications on file prior to visit.     BP 120/88   Temp 98.6 F (37 C) (Oral)   Ht 6' (1.829 m)   Wt 190 lb 12.8 oz (86.5 kg)   BMI 25.88 kg/m       Objective:   Physical Exam  Constitutional: He is oriented to person, place, and time. He appears well-developed and well-nourished. No distress.  Neurological: He is alert and oriented to person, place, and time.  Skin: Skin is warm and dry. No rash noted. He is not diaphoretic. No erythema. No pallor.  Well healed wound around back of head.   Psychiatric: He has a normal mood and affect. His  behavior is normal. Thought content normal.  Nursing note and vitals reviewed.     Assessment & Plan:  1. Visit for suture removal - Attempted to reach Dr. Donnal Moat with [facility] in [place] to make sure that he did not need to see the patient for follow up. Message left with call back number - Will speak with Dr. Donnal Moat prior to removing sutures.  - Will call back and get patient scheduled once I have the ok to remove sutures  Dorothyann Peng, NP

## 2015-10-20 ENCOUNTER — Encounter: Payer: Self-pay | Admitting: Cardiology

## 2015-10-20 ENCOUNTER — Ambulatory Visit (INDEPENDENT_AMBULATORY_CARE_PROVIDER_SITE_OTHER): Payer: BLUE CROSS/BLUE SHIELD | Admitting: Cardiology

## 2015-10-20 ENCOUNTER — Telehealth: Payer: Self-pay | Admitting: Adult Health

## 2015-10-20 ENCOUNTER — Ambulatory Visit (INDEPENDENT_AMBULATORY_CARE_PROVIDER_SITE_OTHER): Payer: BLUE CROSS/BLUE SHIELD | Admitting: Adult Health

## 2015-10-20 ENCOUNTER — Encounter: Payer: Self-pay | Admitting: Adult Health

## 2015-10-20 VITALS — BP 132/88 | Temp 98.2°F | Wt 190.6 lb

## 2015-10-20 VITALS — BP 129/77 | HR 51 | Ht 73.0 in | Wt 189.2 lb

## 2015-10-20 DIAGNOSIS — Z4802 Encounter for removal of sutures: Secondary | ICD-10-CM | POA: Diagnosis not present

## 2015-10-20 DIAGNOSIS — Z9989 Dependence on other enabling machines and devices: Secondary | ICD-10-CM

## 2015-10-20 DIAGNOSIS — G4733 Obstructive sleep apnea (adult) (pediatric): Secondary | ICD-10-CM | POA: Diagnosis not present

## 2015-10-20 DIAGNOSIS — I483 Typical atrial flutter: Secondary | ICD-10-CM | POA: Diagnosis not present

## 2015-10-20 DIAGNOSIS — E785 Hyperlipidemia, unspecified: Secondary | ICD-10-CM | POA: Diagnosis not present

## 2015-10-20 MED ORDER — DILTIAZEM HCL ER COATED BEADS 240 MG PO TB24: ORAL_TABLET | ORAL | 3 refills | Status: DC

## 2015-10-20 NOTE — Progress Notes (Signed)
Pre visit review using our clinic review tool, if applicable. No additional management support is needed unless otherwise documented below in the visit note. 

## 2015-10-20 NOTE — Progress Notes (Signed)
10/20/2015 Steven Byrd   06-03-1955  [id number]  Primary Physician Steven Peng, NP Primary Cardiologist: Dr Steven Byrd  HPI:  60 y/o fit Caucasian male with a history of OSA, on C-pap and atrial flutter. The pt had typical atrial flutter two years ago. He noticed it at night and when waking up. Echo was normal. CHADs VASc=0. He has been controlled with Diltiazem and as far as he can tell has not had atrial flutter since he was placed on C-pap last year. He is active, he hiked [place] last weekend and is planning to go to [place] to do some hiking. He goes to the gym on a regular basis.    Current Outpatient Prescriptions  Medication Sig Dispense Refill  . acyclovir (ZOVIRAX) 800 MG tablet Take 1 tablet (800 mg total) by mouth 3 (three) times daily as needed. 90 tablet 2  . aspirin 81 MG chewable tablet Chew 81 mg by mouth daily.    [name] CREATINE PO Take 1 tablet by mouth daily.    . Cyanocobalamin (VITAMIN B 12 PO) Take 1,000 mg by mouth daily.     . cyclobenzaprine (FLEXERIL) 10 MG tablet Take 1 tablet (10 mg total) by mouth at bedtime. 30 tablet 0  . diazepam (VALIUM) 5 MG tablet Take 1 tablet (5 mg total) by mouth every 6 (six) hours as needed for anxiety. 30 tablet 5  . diltiazem (MATZIM LA) 240 MG 24 hr tablet Take 1/2 tablet by mouth daily. 45 tablet 3  . finasteride (PROSCAR) 5 MG tablet TAKE 1/4 OF A TABLET DAILY 10 tablet 3  . latanoprost (XALATAN) 0.005 % ophthalmic solution Place 1 drop into both eyes every evening.  4  . Pyridoxine HCl (VITAMIN B-6 PO) Take 100 mg by mouth daily.     . sildenafil (VIAGRA) 100 MG tablet Take 1 tablet (100 mg total) by mouth as needed for erectile dysfunction. 10 tablet 10  . simvastatin (ZOCOR) 40 MG tablet TAKE 1 TABLET AT BEDTIME. **MUST KEEP APP FOR FUTHER REFILLS** 90 tablet 0   No current facility-administered medications for this visit.     No Known Allergies  Social History   Social History  . Marital status: Divorced   Spouse name: N/A  . Number of children: N/A  . Years of education: N/A   Occupational History  . Not on file.   Social History Main Topics  . Smoking status: Former Smoker    Quit date: 10/16/1998  . Smokeless tobacco: Never Used  . Alcohol use 8.4 - 12.6 oz/week    14 - 21 Glasses of wine per week     Comment: red wine  . Drug use: No  . Sexual activity: Not on file   Other Topics Concern  . Not on file   Social History Narrative   Has a Licensed conveyancer company since 1978      Daughter who is 102 and a Son who is 79   - Daughter in [place], Son is in [place].       He likes to hike, scuba dive, and travel     Review of Systems: General: negative for chills, fever, night sweats or weight changes.  Cardiovascular: negative for chest pain, dyspnea on exertion, edema, orthopnea, palpitations, paroxysmal nocturnal dyspnea or shortness of breath Dermatological: negative for rash Respiratory: negative for cough or wheezing Urologic: negative for hematuria Abdominal: negative for nausea, vomiting, diarrhea, bright red blood per rectum, melena, or hematemesis Neurologic: negative  for visual changes, syncope, or dizziness All other systems reviewed and are otherwise negative except as noted above.    Blood pressure 129/77, pulse (!) 51, height 6\' 1"  (1.854 m), weight 189 lb 3.2 oz (85.8 kg).  General appearance: alert, cooperative and no distress Neck: no carotid bruit and no JVD Lungs: clear to auscultation bilaterally Heart: regular rate and rhythm Extremities: extremities normal, atraumatic, no cyanosis or edema Skin: Skin color, texture, turgor normal. No rashes or lesions Neurologic: Grossly normal  EKG NSR, SB-51  ASSESSMENT AND PLAN:   Typical atrial flutter No recurrence since starting c-pap  Dyslipidemia Followed by PCO, on statin Rx  OSA on CPAP Compliant with C-pap   PLAN  I refilled his Diltiazem but cut the dose back to 120 mg daily (he asked if he  could stop it and I suggested we just go with a lower dose for now). He is a CHADs VASc=0, at least for another few years, and is on ASA 81 mg. F/U Dr Steven Byrd in a year.  Steven Ransom PA-C 10/20/2015 2:58 PM

## 2015-10-20 NOTE — Assessment & Plan Note (Signed)
No recurrence since starting c-pap

## 2015-10-20 NOTE — Telephone Encounter (Signed)
Please advise 

## 2015-10-20 NOTE — Progress Notes (Signed)
   Subjective:    Patient ID: Steven Byrd, male    DOB: 02-11-1956, 60 y.o.   MRN: [id number]  HPI   60 year old male who presents to the office today for suture removal from donor site s/p hair transplant. He had this procedure done at [facility] in [place], [place]. His sutures have been present for approximately 16 days. He denies any complications from the procedure.     Review of Systems  Skin: Positive for wound. Negative for color change, pallor and rash.  All other systems reviewed and are negative.      Objective:   Physical Exam  Constitutional: He is oriented to person, place, and time. He appears well-developed and well-nourished. No distress.  Neurological: He is alert and oriented to person, place, and time.  Skin: Skin is warm and dry. No rash noted. He is not diaphoretic. No erythema. No pallor.  Sutures along occipital portion of skull. Wound appears well healed with no signs of infection   Psychiatric: He has a normal mood and affect. His behavior is normal. Judgment and thought content normal.  Nursing note and vitals reviewed.     Assessment & Plan:  1. Visit for suture removal - Spoke with Colletta Maryland at [facility] who informed me that it was ok if his sutures were removed in this office.  - All sutures removed and patient tolerated procedure well  - Advised to follow up with signs of infection   Dorothyann Peng, NP

## 2015-10-20 NOTE — Telephone Encounter (Signed)
Pt calling to see if Tommi Rumps has talked with Dr. Donnal Moat about the removal of his sutures.  Pt would like to come in today at 4:00 or Thursday 3:30 or 4:00 due to his other appointments.

## 2015-10-20 NOTE — Assessment & Plan Note (Signed)
Compliant with C-pap 

## 2015-10-20 NOTE — Patient Instructions (Signed)
Medication Change  Decrease Diltiazem to 120 mg daily (1/2 tab)   Follow-Up  Your physician wants you to follow-up in: 1 year with Dr. Debara Pickett. You will receive a reminder letter in the mail two months in advance. If you don't receive a letter, please call our office to schedule the follow-up appointment.  If you need a refill on your cardiac medications before your next appointment, please call your pharmacy.

## 2015-10-20 NOTE — Assessment & Plan Note (Deleted)
Followed by PCO, on statin Rx

## 2015-10-20 NOTE — Assessment & Plan Note (Signed)
Followed by PCO, on statin Rx

## 2015-10-21 ENCOUNTER — Ambulatory Visit: Payer: BLUE CROSS/BLUE SHIELD | Admitting: Adult Health

## 2015-11-14 ENCOUNTER — Other Ambulatory Visit: Payer: Self-pay | Admitting: Internal Medicine

## 2015-11-15 NOTE — Telephone Encounter (Signed)
REFILL 

## 2015-11-17 ENCOUNTER — Other Ambulatory Visit: Payer: Self-pay | Admitting: Family Medicine

## 2015-11-18 ENCOUNTER — Telehealth: Payer: Self-pay | Admitting: Emergency Medicine

## 2015-11-18 DIAGNOSIS — G4733 Obstructive sleep apnea (adult) (pediatric): Secondary | ICD-10-CM | POA: Diagnosis not present

## 2015-11-18 NOTE — Telephone Encounter (Signed)
Called and left Vm for pt to return call to office.  Regarding prescription being printed and up front ready for pick up.

## 2015-12-16 DIAGNOSIS — H04123 Dry eye syndrome of bilateral lacrimal glands: Secondary | ICD-10-CM | POA: Diagnosis not present

## 2015-12-16 DIAGNOSIS — H01003 Unspecified blepharitis right eye, unspecified eyelid: Secondary | ICD-10-CM | POA: Diagnosis not present

## 2015-12-16 DIAGNOSIS — H401131 Primary open-angle glaucoma, bilateral, mild stage: Secondary | ICD-10-CM | POA: Diagnosis not present

## 2015-12-22 ENCOUNTER — Other Ambulatory Visit: Payer: Self-pay | Admitting: Adult Health

## 2015-12-22 ENCOUNTER — Telehealth: Payer: Self-pay | Admitting: Adult Health

## 2015-12-22 ENCOUNTER — Other Ambulatory Visit (INDEPENDENT_AMBULATORY_CARE_PROVIDER_SITE_OTHER): Payer: BLUE CROSS/BLUE SHIELD

## 2015-12-22 DIAGNOSIS — Z0000 Encounter for general adult medical examination without abnormal findings: Secondary | ICD-10-CM | POA: Diagnosis not present

## 2015-12-22 LAB — CBC WITH DIFFERENTIAL/PLATELET
BASOS ABS: 0 10*3/uL (ref 0.0–0.1)
Basophils Relative: 0.7 % (ref 0.0–3.0)
Eosinophils Absolute: 0.1 10*3/uL (ref 0.0–0.7)
Eosinophils Relative: 2.5 % (ref 0.0–5.0)
HCT: 39.9 % (ref 39.0–52.0)
Hemoglobin: 13.8 g/dL (ref 13.0–17.0)
LYMPHS PCT: 37.4 % (ref 12.0–46.0)
Lymphs Abs: 1.7 10*3/uL (ref 0.7–4.0)
MCHC: 34.5 g/dL (ref 30.0–36.0)
MCV: 99.8 fl (ref 78.0–100.0)
MONOS PCT: 12.8 % — AB (ref 3.0–12.0)
Monocytes Absolute: 0.6 10*3/uL (ref 0.1–1.0)
NEUTROS ABS: 2.2 10*3/uL (ref 1.4–7.7)
NEUTROS PCT: 46.6 % (ref 43.0–77.0)
PLATELETS: 201 10*3/uL (ref 150.0–400.0)
RBC: 4 Mil/uL — AB (ref 4.22–5.81)
RDW: 13.5 % (ref 11.5–15.5)
WBC: 4.6 10*3/uL (ref 4.0–10.5)

## 2015-12-22 LAB — HEPATIC FUNCTION PANEL
ALT: 21 U/L (ref 0–53)
AST: 25 U/L (ref 0–37)
Albumin: 4.2 g/dL (ref 3.5–5.2)
Alkaline Phosphatase: 46 U/L (ref 39–117)
BILIRUBIN DIRECT: 0.3 mg/dL (ref 0.0–0.3)
BILIRUBIN TOTAL: 1.8 mg/dL — AB (ref 0.2–1.2)
Total Protein: 7.3 g/dL (ref 6.0–8.3)

## 2015-12-22 LAB — LIPID PANEL
CHOL/HDL RATIO: 3
CHOLESTEROL: 193 mg/dL (ref 0–200)
HDL: 60.9 mg/dL (ref >39.00)
LDL CALC: 117 mg/dL — AB (ref 0–99)
NonHDL: 131.68
TRIGLYCERIDES: 73 mg/dL (ref 0.0–149.0)
VLDL: 14.6 mg/dL (ref 0.0–40.0)

## 2015-12-22 LAB — PSA: PSA: 0.51 ng/mL (ref 0.10–4.00)

## 2015-12-22 LAB — BASIC METABOLIC PANEL
BUN: 20 mg/dL (ref 6–23)
CALCIUM: 9.4 mg/dL (ref 8.4–10.5)
CO2: 26 meq/L (ref 19–32)
Chloride: 99 mEq/L (ref 96–112)
Creatinine, Ser: 1.01 mg/dL (ref 0.40–1.50)
GFR: 79.46 mL/min (ref >60.00)
Glucose, Bld: 87 mg/dL (ref 70–99)
POTASSIUM: 4.2 meq/L (ref 3.5–5.1)
SODIUM: 133 meq/L — AB (ref 135–145)

## 2015-12-22 LAB — TSH: TSH: 2.41 u[IU]/mL (ref 0.35–4.50)

## 2015-12-22 LAB — POC URINALSYSI DIPSTICK (AUTOMATED)
Bilirubin, UA: NEGATIVE
Blood, UA: NEGATIVE
GLUCOSE UA: NEGATIVE
LEUKOCYTES UA: NEGATIVE
Nitrite, UA: NEGATIVE
PROTEIN UA: NEGATIVE
Urobilinogen, UA: 0.2
pH, UA: 5.5

## 2015-12-22 NOTE — Telephone Encounter (Signed)
Patient states he will contact insurance company and find out if it is paid for. If so, he will contact us and let us know to move forward with those orders.

## 2015-12-22 NOTE — Telephone Encounter (Signed)
Left message for patient to return phone call.  

## 2015-12-22 NOTE — Telephone Encounter (Signed)
Pt just had blood work done and would like break down of his lipid. Pt would like LDL-P and APO-B particles test

## 2015-12-22 NOTE — Telephone Encounter (Signed)
Please advise 

## 2015-12-22 NOTE — Telephone Encounter (Signed)
Can you let the patient know that I do not mind trying to add these labs on but I doubt insurance is going to pay for them

## 2015-12-29 ENCOUNTER — Ambulatory Visit (INDEPENDENT_AMBULATORY_CARE_PROVIDER_SITE_OTHER): Payer: BLUE CROSS/BLUE SHIELD | Admitting: Adult Health

## 2015-12-29 ENCOUNTER — Encounter: Payer: Self-pay | Admitting: Adult Health

## 2015-12-29 VITALS — BP 138/64 | Temp 98.2°F | Ht 73.0 in | Wt 191.2 lb

## 2015-12-29 DIAGNOSIS — Z0000 Encounter for general adult medical examination without abnormal findings: Secondary | ICD-10-CM

## 2015-12-29 DIAGNOSIS — N529 Male erectile dysfunction, unspecified: Secondary | ICD-10-CM

## 2015-12-29 DIAGNOSIS — Z23 Encounter for immunization: Secondary | ICD-10-CM | POA: Diagnosis not present

## 2015-12-29 DIAGNOSIS — Z760 Encounter for issue of repeat prescription: Secondary | ICD-10-CM

## 2015-12-29 MED ORDER — FINASTERIDE 5 MG PO TABS: ORAL_TABLET | ORAL | 3 refills | Status: DC

## 2015-12-29 MED ORDER — SILDENAFIL CITRATE 100 MG PO TABS: 100.0000 mg | ORAL_TABLET | ORAL | 10 refills | Status: DC | PRN

## 2015-12-29 MED ORDER — SIMVASTATIN 40 MG PO TABS: ORAL_TABLET | ORAL | 3 refills | Status: DC

## 2015-12-29 NOTE — Progress Notes (Signed)
Subjective:    Patient ID: Steven Byrd, male    DOB: [birth year], 60 y.o.   MRN: [id number]  HPI  Patient presents for yearly preventative medicine examination. He is a pleasant,healthy, and active 60 year old male who  has a past medical history of Atrial flutter ([place]); Dyslipidemia; and OSA on CPAP (01/20/2014).   All immunizations and health maintenance protocols were reviewed with the patient and needed orders were placed.  Appropriate screening laboratory values were ordered for the patient including screening of hyperlipidemia, renal function and hepatic function. If indicated by BPH, a PSA was ordered.  Medication reconciliation,  past medical history, social history, problem list and allergies were reviewed in detail with the patient  Goals were established with regard to weight loss, exercise, and  diet in compliance with medications. He eats healthy and works out Mon-Friday.   He is up to date on his colonoscopy, dental and vision exams. He sees dermatology once a year. He has followed up with Cardiology for A- flutter in August and has not had any issues since starting C-pap  He has no acute complaints.   He just bought a house and has a new grandson on the way .    Review of Systems  Constitutional: Negative.   HENT: Negative.   Eyes: Negative.   Respiratory: Negative.   Cardiovascular: Negative.   Gastrointestinal: Negative.   Endocrine: Negative.   Genitourinary: Negative.   Musculoskeletal: Negative.   Allergic/Immunologic: Negative.   Neurological: Negative.   Hematological: Negative.   Psychiatric/Behavioral: Negative.   All other systems reviewed and are negative.  Past Medical History:  Diagnosis Date  . Atrial flutter ([place])   . Dyslipidemia   . OSA on CPAP 01/20/2014    Social History   Social History  . Marital status: Divorced    Spouse name: N/A  . Number of children: N/A  . Years of education: N/A   Occupational History  . Not on  file.   Social History Main Topics  . Smoking status: Former Smoker    Quit date: 10/16/1998  . Smokeless tobacco: Never Used  . Alcohol use 8.4 - 12.6 oz/week    14 - 21 Glasses of wine per week     Comment: red wine  . Drug use: No  . Sexual activity: Not on file   Other Topics Concern  . Not on file   Social History Narrative   Has a Licensed conveyancer company since 1978      Daughter who is 68 and a Son who is 32   - Daughter in [place], Son is in [place].       He likes to hike, scuba dive, and travel    Past Surgical History:  Procedure Laterality Date  . NO PAST SURGERIES      Family History  Problem Relation Age of Onset  . Diabetes Father   . Stroke Father   . Hypertension Mother     ?  [name] Heart attack Maternal Grandfather   . Colon cancer Neg Hx     No Known Allergies  Current Outpatient Prescriptions on File Prior to Visit  Medication Sig Dispense Refill  . acyclovir (ZOVIRAX) 800 MG tablet Take 1 tablet (800 mg total) by mouth 3 (three) times daily as needed. 90 tablet 2  . aspirin 81 MG chewable tablet Chew 81 mg by mouth daily.    [name] CREATINE PO Take 1 tablet by mouth daily.    [name]  Cyanocobalamin (VITAMIN B 12 PO) Take 1,000 mg by mouth daily.     . diazepam (VALIUM) 5 MG tablet TAKE 1 TABLET BY MOUTH EVERY 6 HOURS AS NEEDED FOR ANXIETY 30 tablet 0  . latanoprost (XALATAN) 0.005 % ophthalmic solution Place 1 drop into both eyes every evening.  4  . MATZIM LA 240 MG 24 hr tablet TAKE 1 TABLET (240 MG TOTAL) BY MOUTH DAILY. 30 tablet 11  . Pyridoxine HCl (VITAMIN B-6 PO) Take 100 mg by mouth daily.      No current facility-administered medications on file prior to visit.     BP 138/64   Temp 98.2 F (36.8 C) (Oral)   Ht 6\' 1"  (1.854 m)   Wt 191 lb 3.2 oz (86.7 kg)   BMI 25.23 kg/m       Objective:   Physical Exam  Constitutional: He is oriented to person, place, and time. He appears well-developed and well-nourished. No distress.  HENT:    Head: Normocephalic and atraumatic.  Right Ear: External ear normal.  Left Ear: External ear normal.  Nose: Nose normal.  Mouth/Throat: Oropharynx is clear and moist. No oropharyngeal exudate.  Eyes: Conjunctivae and EOM are normal. Pupils are equal, round, and reactive to light. Right eye exhibits no discharge. No scleral icterus.  Neck: Normal range of motion. Neck supple. No JVD present. No tracheal tenderness present. Carotid bruit is not present. No tracheal deviation present. No thyromegaly present.  Cardiovascular: Normal rate, regular rhythm, normal heart sounds and intact distal pulses.  Exam reveals no gallop and no friction rub.   No murmur heard. Pulmonary/Chest: Effort normal. No stridor. No respiratory distress. He has no wheezes. He has no rales.  Abdominal: Soft. Bowel sounds are normal. He exhibits no distension and no mass. There is no tenderness. There is no rebound and no guarding.  Genitourinary: Prostate normal. Rectal exam shows guaiac negative stool.  Musculoskeletal: Normal range of motion. He exhibits no edema, tenderness or deformity.  Lymphadenopathy:    He has no cervical adenopathy.  Neurological: He is alert and oriented to person, place, and time. He has normal reflexes. He displays normal reflexes. No cranial nerve deficit. He exhibits normal muscle tone. Coordination normal.  Skin: Skin is warm and dry. No rash noted. He is not diaphoretic. No erythema. No pallor.  Psychiatric: He has a normal mood and affect. His behavior is normal. Judgment and thought content normal.  Nursing note and vitals reviewed.     Assessment & Plan:  1. Routine general medical examination at a health care facility - Reviewed labs in detail with patient. All questions answered - Continue to eat healthy and exercise - Follow up in one year or sooner if needed  2. Need for prophylactic vaccination and inoculation against influenza  - Flu Vaccine QUAD 36+ mos PF IM (Fluarix &  Fluzone Quad PF)  3. Erectile dysfunction, unspecified erectile dysfunction type  - sildenafil (VIAGRA) 100 MG tablet; Take 1 tablet (100 mg total) by mouth as needed for erectile dysfunction.  Dispense: 10 tablet; Refill: 10  4. Medication refill - simvastatin (ZOCOR) 40 MG tablet; TAKE 1 TABLET AT BEDTIME.  Dispense: 90 tablet; Refill: 3 - sildenafil (VIAGRA) 100 MG tablet; Take 1 tablet (100 mg total) by mouth as needed for erectile dysfunction.  Dispense: 10 tablet; Refill: 10 - finasteride (PROSCAR) 5 MG tablet; TAKE 1/4 OF A TABLET DAILY  Dispense: 10 tablet; Refill: 3  Dorothyann Peng, NP

## 2016-01-16 ENCOUNTER — Other Ambulatory Visit: Payer: Self-pay | Admitting: Family Medicine

## 2016-01-16 DIAGNOSIS — E7801 Familial hypercholesterolemia: Secondary | ICD-10-CM

## 2016-05-02 ENCOUNTER — Telehealth: Payer: Self-pay | Admitting: Adult Health

## 2016-05-02 ENCOUNTER — Other Ambulatory Visit: Payer: Self-pay

## 2016-05-02 DIAGNOSIS — N529 Male erectile dysfunction, unspecified: Secondary | ICD-10-CM

## 2016-05-02 DIAGNOSIS — Z760 Encounter for issue of repeat prescription: Secondary | ICD-10-CM

## 2016-05-02 MED ORDER — SILDENAFIL CITRATE 100 MG PO TABS: 100.0000 mg | ORAL_TABLET | ORAL | 10 refills | Status: DC | PRN

## 2016-05-02 NOTE — Telephone Encounter (Signed)
° ° °  Pt call to say the below pharmacy is trying to get a RX for the below med     Pt request refill of the following:  sildenafil (VIAGRA) 100 MG tablet   Phamacy:    [facility] number [phone]

## 2016-05-02 NOTE — Telephone Encounter (Signed)
Ok to refill for one year  

## 2016-05-02 NOTE — Telephone Encounter (Signed)
Ok to refill 

## 2016-05-02 NOTE — Telephone Encounter (Signed)
Rx has been faxed in.  

## 2016-05-30 ENCOUNTER — Ambulatory Visit (INDEPENDENT_AMBULATORY_CARE_PROVIDER_SITE_OTHER): Payer: BLUE CROSS/BLUE SHIELD | Admitting: Adult Health

## 2016-05-30 ENCOUNTER — Encounter: Payer: Self-pay | Admitting: Adult Health

## 2016-05-30 VITALS — BP 112/72 | Temp 97.7°F | Wt 187.0 lb

## 2016-05-30 DIAGNOSIS — L02214 Cutaneous abscess of groin: Secondary | ICD-10-CM | POA: Diagnosis not present

## 2016-05-30 MED ORDER — CEPHALEXIN 500 MG PO CAPS: 500.0000 mg | ORAL_CAPSULE | Freq: Three times a day (TID) | ORAL | 0 refills | Status: DC

## 2016-05-30 NOTE — Progress Notes (Signed)
Subjective:    Patient ID: Steven Byrd, male    DOB: November 13, 1955, 61 y.o.   MRN: [id number]  HPI  61 year old male who  has a past medical history of Atrial flutter ([place]); Dyslipidemia; and OSA on CPAP (01/20/2014). he presents to the office today for the acute complaint of abscess in groin. He reports first noticing an abscess in his right groin about a week ago. Since that time the abscess has become more tender to touch, large in size and has started to have discharge. He believes it came from an ingrown hair.   Denies any fevers   Review of Systems See HPI   Past Medical History:  Diagnosis Date  . Atrial flutter ([place])   . Dyslipidemia   . OSA on CPAP 01/20/2014    Social History   Social History  . Marital status: Divorced    Spouse name: N/A  . Number of children: N/A  . Years of education: N/A   Occupational History  . Not on file.   Social History Main Topics  . Smoking status: Former Smoker    Quit date: 10/16/1998  . Smokeless tobacco: Never Used  . Alcohol use 8.4 - 12.6 oz/week    14 - 21 Glasses of wine per week     Comment: red wine  . Drug use: No  . Sexual activity: Not on file   Other Topics Concern  . Not on file   Social History Narrative   Has a Licensed conveyancer company since 1978      Daughter who is 32 and a Son who is 56   - Daughter in [place], Son is in [place].       He likes to hike, scuba dive, and travel    Past Surgical History:  Procedure Laterality Date  . NO PAST SURGERIES      Family History  Problem Relation Age of Onset  . Diabetes Father   . Stroke Father   . Hypertension Mother     ?  [name] Heart attack Maternal Grandfather   . Colon cancer Neg Hx     No Known Allergies  Current Outpatient Prescriptions on File Prior to Visit  Medication Sig Dispense Refill  . acyclovir (ZOVIRAX) 800 MG tablet Take 1 tablet (800 mg total) by mouth 3 (three) times daily as needed. 90 tablet 2  . aspirin 81 MG chewable  tablet Chew 81 mg by mouth daily.    [name] CREATINE PO Take 1 tablet by mouth daily.    . Cyanocobalamin (VITAMIN B 12 PO) Take 1,000 mg by mouth daily.     . diazepam (VALIUM) 5 MG tablet TAKE 1 TABLET BY MOUTH EVERY 6 HOURS AS NEEDED FOR ANXIETY 30 tablet 0  . finasteride (PROSCAR) 5 MG tablet TAKE 1/4 OF A TABLET DAILY 10 tablet 3  . latanoprost (XALATAN) 0.005 % ophthalmic solution Place 1 drop into both eyes every evening.  4  . MATZIM LA 240 MG 24 hr tablet TAKE 1 TABLET (240 MG TOTAL) BY MOUTH DAILY. 30 tablet 11  . Pyridoxine HCl (VITAMIN B-6 PO) Take 100 mg by mouth daily.     . sildenafil (VIAGRA) 100 MG tablet Take 1 tablet (100 mg total) by mouth as needed for erectile dysfunction. 10 tablet 10  . simvastatin (ZOCOR) 40 MG tablet TAKE 1 TABLET AT BEDTIME. 90 tablet 3  . simvastatin (ZOCOR) 40 MG tablet TAKE 1 TABLET BY MOUTH AT BEDTIME 90  tablet 3   No current facility-administered medications on file prior to visit.     BP 112/72 (BP Location: Left Arm, Patient Position: Sitting, Cuff Size: Large)   Temp 97.7 F (36.5 C) (Oral)   Wt 187 lb (84.8 kg)   BMI 24.67 kg/m       Objective:   Physical Exam  Constitutional: He is oriented to person, place, and time. He appears well-developed and well-nourished. No distress.  Neurological: He is alert and oriented to person, place, and time.  Skin: Skin is warm and dry. No rash noted. He is not diaphoretic. There is erythema. No pallor.  Dime sized abscess in right groin. Redness, warmth and tenderness noted. Draining purulent discharge   Psychiatric: He has a normal mood and affect. His behavior is normal. Judgment and thought content normal.  Nursing note and vitals reviewed.     Assessment & Plan:  1. Abscess of groin, right Procedure:  Incision and drainage of abscess Risks, benefits, and alternatives explained and consent obtained. Time out conducted. Surface cleaned with alcohol. 1.5  cc lidocaine without epinephine  infiltrated around abscess. Adequate anesthesia ensured. Area prepped and draped in a sterile fashion. #15 blade used to make a stab incision into abscess. Pus expressed with pressure. Curved hemostat used to explore 4 quadrants and loculations broken up. Further purulence expressed. 0.5 inches of iodoform packing placed leaving a 1-inch tail. Hemostasis achieved. Pt stable. Aftercare and follow-up advised. - cephALEXin (KEFLEX) 500 MG capsule; Take 1 capsule (500 mg total) by mouth 3 (three) times daily.  Dispense: 30 capsule; Refill: 0  Dorothyann Peng, NP

## 2016-06-06 ENCOUNTER — Encounter: Payer: Self-pay | Admitting: Adult Health

## 2016-06-06 ENCOUNTER — Ambulatory Visit (INDEPENDENT_AMBULATORY_CARE_PROVIDER_SITE_OTHER): Payer: BLUE CROSS/BLUE SHIELD | Admitting: Adult Health

## 2016-06-06 DIAGNOSIS — L02214 Cutaneous abscess of groin: Secondary | ICD-10-CM

## 2016-06-06 MED ORDER — CEPHALEXIN 500 MG PO CAPS: 500.0000 mg | ORAL_CAPSULE | Freq: Two times a day (BID) | ORAL | 0 refills | Status: AC

## 2016-06-06 NOTE — Progress Notes (Signed)
Subjective:    Patient ID: Steven Byrd, male    DOB: December 30, 1955, 61 y.o.   MRN: [id number]  HPI  61 year old male who presents to the office today for one week follow up after I&D of abscess in right groin. He reports that he has removed the packing 24 hours after being seen. He had drainage for the first few days. He has been taking the antibiotics as directed. Redness, warmth, and pain have diminished.   He continues to have a " hard knot"   Review of Systems See HPI   Past Medical History:  Diagnosis Date  . Atrial flutter ([place])   . Dyslipidemia   . OSA on CPAP 01/20/2014    Social History   Social History  . Marital status: Divorced    Spouse name: N/A  . Number of children: N/A  . Years of education: N/A   Occupational History  . Not on file.   Social History Main Topics  . Smoking status: Former Smoker    Quit date: 10/16/1998  . Smokeless tobacco: Never Used  . Alcohol use 8.4 - 12.6 oz/week    14 - 21 Glasses of wine per week     Comment: red wine  . Drug use: No  . Sexual activity: Not on file   Other Topics Concern  . Not on file   Social History Narrative   Has a Licensed conveyancer company since 1978      Daughter who is 23 and a Son who is 24   - Daughter in [place], Son is in [place].       He likes to hike, scuba dive, and travel    Past Surgical History:  Procedure Laterality Date  . NO PAST SURGERIES      Family History  Problem Relation Age of Onset  . Diabetes Father   . Stroke Father   . Hypertension Mother     ?  [name] Heart attack Maternal Grandfather   . Colon cancer Neg Hx     No Known Allergies  Current Outpatient Prescriptions on File Prior to Visit  Medication Sig Dispense Refill  . acyclovir (ZOVIRAX) 800 MG tablet Take 1 tablet (800 mg total) by mouth 3 (three) times daily as needed. 90 tablet 2  . aspirin 81 MG chewable tablet Chew 81 mg by mouth daily.    . cephALEXin (KEFLEX) 500 MG capsule Take 1 capsule (500  mg total) by mouth 3 (three) times daily. 30 capsule 0  . CREATINE PO Take 1 tablet by mouth daily.    . Cyanocobalamin (VITAMIN B 12 PO) Take 1,000 mg by mouth daily.     . diazepam (VALIUM) 5 MG tablet TAKE 1 TABLET BY MOUTH EVERY 6 HOURS AS NEEDED FOR ANXIETY 30 tablet 0  . finasteride (PROSCAR) 5 MG tablet TAKE 1/4 OF A TABLET DAILY 10 tablet 3  . latanoprost (XALATAN) 0.005 % ophthalmic solution Place 1 drop into both eyes every evening.  4  . MATZIM LA 240 MG 24 hr tablet TAKE 1 TABLET (240 MG TOTAL) BY MOUTH DAILY. 30 tablet 11  . Pyridoxine HCl (VITAMIN B-6 PO) Take 100 mg by mouth daily.     . sildenafil (VIAGRA) 100 MG tablet Take 1 tablet (100 mg total) by mouth as needed for erectile dysfunction. 10 tablet 10  . simvastatin (ZOCOR) 40 MG tablet TAKE 1 TABLET BY MOUTH AT BEDTIME 90 tablet 3   No current facility-administered medications  on file prior to visit.     BP 112/60 (BP Location: Left Arm, Patient Position: Sitting, Cuff Size: Normal)   Temp 98.1 F (36.7 C) (Oral)   Ht 6\' 1"  (1.854 m)   Wt 188 lb 4.8 oz (85.4 kg)   BMI 24.84 kg/m       Objective:   Physical Exam  Constitutional: He is oriented to person, place, and time. He appears well-developed and well-nourished. No distress.  Cardiovascular: Normal rate, regular rhythm, normal heart sounds and intact distal pulses.  Exam reveals no gallop and no friction rub.   No murmur heard. Pulmonary/Chest: Effort normal and breath sounds normal. No respiratory distress. He has no wheezes. He has no rales. He exhibits no tenderness.  Neurological: He is alert and oriented to person, place, and time.  Skin: Skin is warm and dry. He is not diaphoretic.  Mild erythemia. No pain with palpation. No warmth noted. Appears to be healing well.   Psychiatric: He has a normal mood and affect. His behavior is normal. Judgment and thought content normal.  Nursing note and vitals reviewed.     Assessment & Plan:  1. Abscess of  groin, right - Appears to be healing well. Will give another week of Keflex.  - cephALEXin (KEFLEX) 500 MG capsule; Take 1 capsule (500 mg total) by mouth 2 (two) times daily.  Dispense: 14 capsule; Refill: 0 - Follow up as needed  Dorothyann Peng, NP

## 2016-06-12 DIAGNOSIS — H2513 Age-related nuclear cataract, bilateral: Secondary | ICD-10-CM | POA: Diagnosis not present

## 2016-06-12 DIAGNOSIS — H401111 Primary open-angle glaucoma, right eye, mild stage: Secondary | ICD-10-CM | POA: Diagnosis not present

## 2016-06-12 DIAGNOSIS — H40051 Ocular hypertension, right eye: Secondary | ICD-10-CM | POA: Diagnosis not present

## 2016-06-12 DIAGNOSIS — H401131 Primary open-angle glaucoma, bilateral, mild stage: Secondary | ICD-10-CM | POA: Diagnosis not present

## 2016-06-12 DIAGNOSIS — H353131 Nonexudative age-related macular degeneration, bilateral, early dry stage: Secondary | ICD-10-CM | POA: Diagnosis not present

## 2016-06-12 DIAGNOSIS — H401121 Primary open-angle glaucoma, left eye, mild stage: Secondary | ICD-10-CM | POA: Diagnosis not present

## 2016-09-13 DIAGNOSIS — L814 Other melanin hyperpigmentation: Secondary | ICD-10-CM | POA: Diagnosis not present

## 2016-09-13 DIAGNOSIS — L821 Other seborrheic keratosis: Secondary | ICD-10-CM | POA: Diagnosis not present

## 2016-09-13 DIAGNOSIS — Z85828 Personal history of other malignant neoplasm of skin: Secondary | ICD-10-CM | POA: Diagnosis not present

## 2016-09-13 DIAGNOSIS — D1800 Hemangioma unspecified site: Secondary | ICD-10-CM | POA: Diagnosis not present

## 2016-11-21 DIAGNOSIS — H401121 Primary open-angle glaucoma, left eye, mild stage: Secondary | ICD-10-CM | POA: Diagnosis not present

## 2016-11-21 DIAGNOSIS — H401131 Primary open-angle glaucoma, bilateral, mild stage: Secondary | ICD-10-CM | POA: Diagnosis not present

## 2016-11-21 DIAGNOSIS — M3501 Sicca syndrome with keratoconjunctivitis: Secondary | ICD-10-CM | POA: Diagnosis not present

## 2016-11-21 DIAGNOSIS — H40051 Ocular hypertension, right eye: Secondary | ICD-10-CM | POA: Diagnosis not present

## 2016-11-21 DIAGNOSIS — H04123 Dry eye syndrome of bilateral lacrimal glands: Secondary | ICD-10-CM | POA: Diagnosis not present

## 2016-11-21 DIAGNOSIS — H401111 Primary open-angle glaucoma, right eye, mild stage: Secondary | ICD-10-CM | POA: Diagnosis not present

## 2017-01-10 ENCOUNTER — Telehealth: Payer: Self-pay | Admitting: Internal Medicine

## 2017-01-10 ENCOUNTER — Encounter: Payer: Self-pay | Admitting: *Deleted

## 2017-01-10 NOTE — Telephone Encounter (Signed)
Please call asap,pt is in Atrial Fib.

## 2017-01-10 NOTE — Telephone Encounter (Signed)
Returned the call to the patient. He stated that yesterday he woke up with a heart rate of 180. This morning it was 140. He thinks he's back in atrial fibrillation. The patient wears a cpap at night for sleep apnea and has not been in afib for several years.  He currently has decreased his diltiazem to 120 mg tablet daily. This morning he took 240 mg. His only symptom is a slight headache other than that he states that he feels fine.  Per the DOD, he should take an additional 120 mg today then take 240 mg daily. An appointment has been made for tomorrow at 59 with [name], [place]. He verbalized his understanding.

## 2017-01-11 ENCOUNTER — Encounter: Payer: Self-pay | Admitting: Cardiology

## 2017-01-11 ENCOUNTER — Ambulatory Visit (INDEPENDENT_AMBULATORY_CARE_PROVIDER_SITE_OTHER): Payer: BLUE CROSS/BLUE SHIELD | Admitting: Cardiology

## 2017-01-11 DIAGNOSIS — G4733 Obstructive sleep apnea (adult) (pediatric): Secondary | ICD-10-CM | POA: Diagnosis not present

## 2017-01-11 DIAGNOSIS — I483 Typical atrial flutter: Secondary | ICD-10-CM

## 2017-01-11 DIAGNOSIS — E785 Hyperlipidemia, unspecified: Secondary | ICD-10-CM

## 2017-01-11 DIAGNOSIS — Z9989 Dependence on other enabling machines and devices: Secondary | ICD-10-CM

## 2017-01-11 MED ORDER — METOPROLOL TARTRATE 25 MG PO TABS
12.5000 mg | ORAL_TABLET | Freq: Two times a day (BID) | ORAL | 3 refills | Status: DC
Start: 2017-01-11 — End: 2017-02-05

## 2017-01-11 NOTE — Assessment & Plan Note (Signed)
Pt seen for recurrent atrial flutter with RVR

## 2017-01-11 NOTE — Assessment & Plan Note (Signed)
Compliant with C-pap 

## 2017-01-11 NOTE — Patient Instructions (Signed)
Medication Instructions:  DILTIAZEM 240 MG DAILY METOPROLOL TARTRATE 12.5 (1/2 TAB of 25MG  TAB) TWICE DAILY      IF WELL TOLERATED AND PULSE IS >120 INCREASE TO 25MG  (WHOLE TAB) TWICE DAILY If you need a refill on your cardiac medications before your next appointment, please call your pharmacy.  Labwork: CMET, TSH, MAG TODAY HERE IN OUR OFFICE AT [facility]  Special Instructions: REFER TO DR Rayann Heman AT OUR [address] OFFICE   Follow-Up: Your physician wants you to follow-up in: [place] DR [facility].  Thank you for choosing [facility] at [facility]!!

## 2017-01-11 NOTE — Progress Notes (Signed)
01/11/2017 RONDEY FALLEN   12-19-1955  [id number]  Primary Physician Carlisle Cater, Tommi Rumps, NP Primary Cardiologist: Dr Debara Pickett  HPI:  61 y/o fit Caucasian male with a history of OSA, on C-pap, and atrial flutter. The pt had typical atrial flutter in Aug 2015. Echo showed normal LVF with an EF of 60-65% and his LA was at the upper limits of normal. His CHADs VASc=0 and he has been on ASA. He has been controlled with Diltiazem and as far as he can tell has not had atrial flutter since he was placed on C-pap Oct 2015. When I saw him in Aug 2017 he was in NSR- SB at 45. I suggested he cut his Diltiazem back to 120 mg daily.He is active, he hiked [place] in 2016 and hiked in [place] in 2017. He goes to the gym on a regular basis. He drinks two glasses of red wine a night. He called the office and asked to be seen because he felt he was back in atrial flutter. He thinks he has in atrial flutter x two days. He denies any syncope or SOB. He was recently playing gold with Dr Roxan Hockey and he suggested he see [place] and mentioned ablation. Interestingly, the pt's girlfriend recently had an ablation at [facility].      Current Outpatient Medications  Medication Sig Dispense Refill  . acyclovir (ZOVIRAX) 800 MG tablet Take 1 tablet (800 mg total) by mouth 3 (three) times daily as needed. 90 tablet 2  . aspirin 81 MG chewable tablet Chew 81 mg by mouth daily.    . Cyanocobalamin (VITAMIN B 12 PO) Take 1,000 mg by mouth daily.     . finasteride (PROSCAR) 5 MG tablet TAKE 1/4 OF A TABLET DAILY 10 tablet 3  . latanoprost (XALATAN) 0.005 % ophthalmic solution Place 1 drop into both eyes every evening.  4  . MATZIM LA 240 MG 24 hr tablet TAKE 1 TABLET (240 MG TOTAL) BY MOUTH DAILY. 30 tablet 11  . Pyridoxine HCl (VITAMIN B-6 PO) Take 100 mg by mouth daily.     . sildenafil (VIAGRA) 100 MG tablet Take 1 tablet (100 mg total) by mouth as needed for erectile dysfunction. 10 tablet 10  . simvastatin (ZOCOR) 40 MG  tablet TAKE 1 TABLET BY MOUTH AT BEDTIME 90 tablet 3   No current facility-administered medications for this visit.     No Known Allergies  Past Medical History:  Diagnosis Date  . Atrial flutter ([place])   . Dyslipidemia   . OSA on CPAP 01/20/2014    Social History   Socioeconomic History  . Marital status: Divorced    Spouse name: Not on file  . Number of children: Not on file  . Years of education: Not on file  . Highest education level: Not on file  Social Needs  . Financial resource strain: Not on file  . Food insecurity - worry: Not on file  . Food insecurity - inability: Not on file  . Transportation needs - medical: Not on file  . Transportation needs - non-medical: Not on file  Occupational History  . Not on file  Tobacco Use  . Smoking status: Former Smoker    Last attempt to quit: 10/16/1998    Years since quitting: 18.2  . Smokeless tobacco: Never Used  Substance and Sexual Activity  . Alcohol use: Yes    Alcohol/week: 8.4 - 12.6 oz    Types: 14 - 21 Glasses of wine per week  Comment: red wine  . Drug use: No  . Sexual activity: Not on file  Other Topics Concern  . Not on file  Social History Narrative   Has a Licensed conveyancer company since 1978      Daughter who is 41 and a Son who is 53   - Daughter in [place], Son is in [place].       He likes to hike, scuba dive, and travel     Family History  Problem Relation Age of Onset  . Diabetes Father   . Stroke Father   . Hypertension Mother        ?  [name] Heart attack Maternal Grandfather   . Colon cancer Neg Hx      Review of Systems: General: negative for chills, fever, night sweats or weight changes.  Cardiovascular: negative for chest pain, dyspnea on exertion, edema, orthopnea, palpitations, paroxysmal nocturnal dyspnea or shortness of breath Dermatological: negative for rash Respiratory: negative for cough or wheezing Urologic: negative for hematuria Abdominal: negative for nausea,  vomiting, diarrhea, bright red blood per rectum, melena, or hematemesis Neurologic: negative for visual changes, syncope, or dizziness All other systems reviewed and are otherwise negative except as noted above.    Blood pressure (!) 139/97, pulse (!) 111, height 6\' 1"  (1.854 m), weight 189 lb 9.6 oz (86 kg).  General appearance: alert, cooperative and no distress Neck: no carotid bruit, no JVD and thyroid not enlarged, symmetric, no tenderness/mass/nodules Lungs: clear to auscultation bilaterally Heart: irregularly irregular rhythm Extremities: extremities normal, atraumatic, no cyanosis or edema Skin: Skin color, texture, turgor normal. No rashes or lesions Neurologic: Grossly normal  EKG Atrial flutter with variable VR-rate 111  ASSESSMENT AND PLAN:   Typical atrial flutter Pt seen for recurrent atrial flutter with RVR  OSA on CPAP Compliant with C-pap  Dyslipidemia On statin rx   PLAN  The pt has requested a consult with Dr Rayann Heman and I will arrange this. In the meantime he'll continue with his increased Diltiazem 240 mg and I added Lopressor 12.5 mg BID to be increased to 25 mg BID if his HR is > 120 in two days. I also ordered CMET, Mg++, TSH. I discussed anticoagulation with him but he wants to stay with ASA for now.   [name] PA-C 01/11/2017 11:48 AM

## 2017-01-11 NOTE — Assessment & Plan Note (Signed)
On statin rx 

## 2017-01-12 LAB — COMPREHENSIVE METABOLIC PANEL
ALT: 16 IU/L (ref 0–44)
AST: 25 IU/L (ref 0–40)
Albumin/Globulin Ratio: 1.3 (ref 1.2–2.2)
Albumin: 4.3 g/dL (ref 3.6–4.8)
Alkaline Phosphatase: 70 IU/L (ref 39–117)
BUN/Creatinine Ratio: 17 (ref 10–24)
BUN: 18 mg/dL (ref 8–27)
Bilirubin Total: 1 mg/dL (ref 0.0–1.2)
CO2: 24 mmol/L (ref 20–29)
Calcium: 9.3 mg/dL (ref 8.6–10.2)
Chloride: 99 mmol/L (ref 96–106)
Creatinine, Ser: 1.06 mg/dL (ref 0.76–1.27)
GFR calc Af Amer: 86 mL/min/{1.73_m2} (ref >59)
GFR calc non Af Amer: 74 mL/min/{1.73_m2} (ref >59)
Globulin, Total: 3.3 g/dL (ref 1.5–4.5)
Glucose: 99 mg/dL (ref 65–99)
Potassium: 4.7 mmol/L (ref 3.5–5.2)
Sodium: 139 mmol/L (ref 134–144)
Total Protein: 7.6 g/dL (ref 6.0–8.5)

## 2017-01-12 LAB — MAGNESIUM: Magnesium: 1.9 mg/dL (ref 1.6–2.3)

## 2017-01-12 LAB — TSH: TSH: 1.56 u[IU]/mL (ref 0.450–4.500)

## 2017-01-18 ENCOUNTER — Telehealth: Payer: Self-pay | Admitting: Cardiology

## 2017-01-18 NOTE — Telephone Encounter (Signed)
I called, the patient is having variable rates- sometimes > 140 according to his Apple watch, sometimes 50. I suspect he is in and out of AF. He is tolertaing this well but he can tell he is in AF. I'll see if his appointment with Dr Rayann Heman could be moved up. Discussed stroke risk with pt, he is CHADs VASc=0. He is on ASA.   [name] PA-C 01/18/2017 9:58 AM

## 2017-02-05 ENCOUNTER — Encounter (INDEPENDENT_AMBULATORY_CARE_PROVIDER_SITE_OTHER): Payer: Self-pay

## 2017-02-05 ENCOUNTER — Encounter: Payer: Self-pay | Admitting: Internal Medicine

## 2017-02-05 ENCOUNTER — Ambulatory Visit (INDEPENDENT_AMBULATORY_CARE_PROVIDER_SITE_OTHER): Payer: BLUE CROSS/BLUE SHIELD | Admitting: Internal Medicine

## 2017-02-05 VITALS — BP 120/70 | HR 51 | Ht 73.0 in | Wt 192.2 lb

## 2017-02-05 DIAGNOSIS — G4733 Obstructive sleep apnea (adult) (pediatric): Secondary | ICD-10-CM | POA: Diagnosis not present

## 2017-02-05 DIAGNOSIS — Z9989 Dependence on other enabling machines and devices: Secondary | ICD-10-CM | POA: Diagnosis not present

## 2017-02-05 DIAGNOSIS — I483 Typical atrial flutter: Secondary | ICD-10-CM | POA: Diagnosis not present

## 2017-02-05 NOTE — Patient Instructions (Addendum)
Medication Instructions:  Your physician recommends that you continue on your current medications as directed. Please refer to the Current Medication list given to you today.   Labwork: None ordered   Testing/Procedures: None ordered   Follow-Up: Your physician recommends that you schedule a follow-up appointment as needed  Please call back if you decide to proceed with Flutter ablation---will need CARTO/ANES  Available Thursday dates in January are: 1/3, 1/10, 1/17, and 1/31  Any Other Special Instructions Will Be Listed Below (If Applicable).   Cardiac Ablation Cardiac ablation is a procedure to disable (ablate) a small amount of heart tissue in very specific places. The heart has many electrical connections. Sometimes these connections are abnormal and can cause the heart to beat very fast or irregularly. Ablating some of the problem areas can improve the heart rhythm or return it to normal. Ablation may be done for people who:  Have Wolff-Parkinson-White syndrome.  Have fast heart rhythms (tachycardia).  Have taken medicines for an abnormal heart rhythm (arrhythmia) that were not effective or caused side effects.  Have a high-risk heartbeat that may be life-threatening.  During the procedure, a small incision is made in the neck or the groin, and a long, thin, flexible tube (catheter) is inserted into the incision and moved to the heart. Small devices (electrodes) on the tip of the catheter will send out electrical currents. A type of X-ray (fluoroscopy) will be used to help guide the catheter and to provide images of the heart. Tell a health care provider about:  Any allergies you have.  All medicines you are taking, including vitamins, herbs, eye drops, creams, and over-the-counter medicines.  Any problems you or family members have had with anesthetic medicines.  Any blood disorders you have.  Any surgeries you have had.  Any medical conditions you have, such as  kidney failure.  Whether you are pregnant or may be pregnant. What are the risks? Generally, this is a safe procedure. However, problems may occur, including:  Infection.  Bruising and bleeding at the catheter insertion site.  Bleeding into the chest, especially into the sac that surrounds the heart. This is a serious complication.  Stroke or blood clots.  Damage to other structures or organs.  Allergic reaction to medicines or dyes.  Need for a permanent pacemaker if the normal electrical system is damaged. A pacemaker is a small computer that sends electrical signals to the heart and helps your heart beat normally.  The procedure not being fully effective. This may not be recognized until months later. Repeat ablation procedures are sometimes required.  What happens before the procedure?  Follow instructions from your health care provider about eating or drinking restrictions.  Ask your health care provider about: ? Changing or stopping your regular medicines. This is especially important if you are taking diabetes medicines or blood thinners. ? Taking medicines such as aspirin and ibuprofen. These medicines can thin your blood. Do not take these medicines before your procedure if your health care provider instructs you not to.  Plan to have someone take you home from the hospital or clinic.  If you will be going home right after the procedure, plan to have someone with you for 24 hours. What happens during the procedure?  To lower your risk of infection: ? Your health care team will wash or sanitize their hands. ? Your skin will be washed with soap. ? Hair may be removed from the incision area.  An IV tube will be inserted  into one of your veins.  You will be given a medicine to help you relax (sedative).  The skin on your neck or groin will be numbed.  An incision will be made in your neck or your groin.  A needle will be inserted through the incision and into a  large vein in your neck or groin.  A catheter will be inserted into the needle and moved to your heart.  Dye may be injected through the catheter to help your surgeon see the area of the heart that needs treatment.  Electrical currents will be sent from the catheter to ablate heart tissue in desired areas. There are three types of energy that may be used to ablate heart tissue: ? Heat (radiofrequency energy). ? Laser energy. ? Extreme cold (cryoablation).  When the necessary tissue has been ablated, the catheter will be removed.  Pressure will be held on the catheter insertion area to prevent excessive bleeding.  A bandage (dressing) will be placed over the catheter insertion area. The procedure may vary among health care providers and hospitals. What happens after the procedure?  Your blood pressure, heart rate, breathing rate, and blood oxygen level will be monitored until the medicines you were given have worn off.  Your catheter insertion area will be monitored for bleeding. You will need to lie still for a few hours to ensure that you do not bleed from the catheter insertion area.  Do not drive for 24 hours or as long as directed by your health care provider. Summary  Cardiac ablation is a procedure to disable (ablate) a small amount of heart tissue in very specific places. Ablating some of the problem areas can improve the heart rhythm or return it to normal.  During the procedure, electrical currents will be sent from the catheter to ablate heart tissue in desired areas. This information is not intended to replace advice given to you by your health care provider. Make sure you discuss any questions you have with your health care provider. Document Released: 07/09/2008 Document Revised: 01/10/2016 Document Reviewed: 01/10/2016 Elsevier Interactive Patient Education  [employer].      If you need a refill on your cardiac medications before your next appointment,  please call your pharmacy.

## 2017-02-05 NOTE — Progress Notes (Signed)
Electrophysiology Office Note   Date:  02/05/2017   ID:  [name], [name] 1955/06/03, MRN [id number]  PCP:  Dorothyann Peng, NP  Cardiologist:  Dr Debara Pickett Primary Electrophysiologist: Thompson Grayer, MD    Chief Complaint  Patient presents with  . Atrial Flutter     History of Present Illness: Steven Byrd is a 61 y.o. male who presents today for electrophysiology evaluation.   He is referred by Dr Debara Pickett and [name] for EP consultation regarding atrial flutter.  He initially developed atrial flutter (typical) 09/2013 (EKG reviewed).  He was cardioverted.  He was then diagnosed with sleep apnea.  With use of CPAP, his atrial flutter was quiescent until early November.  He returned with symptoms of palpitations and was again found to have typical appearing atrial flutter.  After about 3 weeks, his atrial flutter resolved.  He reports palpitations with his atrial arrhythmia. He remains very active. Today, he denies symptoms of chest pain, shortness of breath, orthopnea, PND, lower extremity edema, claudication, dizziness, presyncope, syncope, bleeding, or neurologic sequela. The patient is tolerating medications without difficulties and is otherwise without complaint today.    Past Medical History:  Diagnosis Date  . Atrial flutter ([place])   . Dyslipidemia   . OSA on CPAP 01/20/2014   Past Surgical History:  Procedure Laterality Date  . NO PAST SURGERIES       Current Outpatient Medications  Medication Sig Dispense Refill  . acyclovir (ZOVIRAX) 800 MG tablet Take 1 tablet (800 mg total) by mouth 3 (three) times daily as needed. 90 tablet 2  . aspirin 81 MG chewable tablet Chew 81 mg by mouth daily.    [name] CALCIUM-MAGNESIUM-ZINC PO Take 1 capsule by mouth daily.    . cholecalciferol (VITAMIN D) 1000 units tablet Take 1,000 Units by mouth daily.    . Coenzyme Q10 (CO Q 10 PO) Take 1 capsule by mouth daily.    . finasteride (PROSCAR) 5 MG tablet TAKE 1/4 OF A TABLET DAILY  10 tablet 3  . latanoprost (XALATAN) 0.005 % ophthalmic solution Place 1 drop into both eyes every evening.  4  . MATZIM LA 240 MG 24 hr tablet TAKE 1 TABLET (240 MG TOTAL) BY MOUTH DAILY. 30 tablet 11  . metoprolol tartrate (LOPRESSOR) 25 MG tablet Take 25 mg by mouth 2 (two) times daily.    . Multiple Vitamin (MULTIVITAMIN) capsule Take 1 capsule by mouth daily.    . sildenafil (VIAGRA) 100 MG tablet Take 1 tablet (100 mg total) by mouth as needed for erectile dysfunction. 10 tablet 10  . simvastatin (ZOCOR) 40 MG tablet TAKE 1 TABLET BY MOUTH AT BEDTIME 90 tablet 3  . TURMERIC PO Take 1 tablet by mouth daily.     No current facility-administered medications for this visit.     Allergies:   Patient has no known allergies.   Social History:  The patient  reports that he quit smoking about 18 years ago. he has never used smokeless tobacco. He reports that he drinks about 8.4 - 12.6 oz of alcohol per week. He reports that he does not use drugs.   Family History:  The patient's  family history includes Diabetes in his father; Heart attack in his maternal grandfather; Hypertension in his mother; Stroke in his father.    ROS:  Please see the history of present illness.   All other systems are personally reviewed and negative.    PHYSICAL EXAM: VS:  BP 120/70  Pulse (!) 51   Ht 6\' 1"  (1.854 m)   Wt 192 lb 3.2 oz (87.2 kg)   SpO2 98%   BMI 25.36 kg/m  , BMI Body mass index is 25.36 kg/m. GEN: Well nourished, well developed, in no acute distress  HEENT: normal  Neck: no JVD, carotid bruits, or masses Cardiac: RRR; no murmurs, rubs, or gallops,no edema  Respiratory:  clear to auscultation bilaterally, normal work of breathing GI: soft, nontender, nondistended, + BS MS: no deformity or atrophy  Skin: warm and dry  Neuro:  Strength and sensation are intact Psych: euthymic mood, full affect  EKG:  EKG is ordered today. The ekg ordered today is personally reviewed and shows sinus  bradycardia 51 bpm, otherwise normal ekg  EKG 01/11/17 is also reviewed and reveals typical appearing atrial flutter   Recent Labs: 01/11/2017: ALT 16; BUN 18; Creatinine, Ser 1.06; Magnesium 1.9; Potassium 4.7; Sodium 139; TSH 1.560  personally reviewed   Lipid Panel     Component Value Date/Time   CHOL 193 12/22/2015 0817   TRIG 73.0 12/22/2015 0817   HDL 60.90 12/22/2015 0817   CHOLHDL 3 12/22/2015 0817   VLDL 14.6 12/22/2015 0817   LDLCALC 117 (H) 12/22/2015 0817   LDLDIRECT 126.8 02/01/2010 0923   personally reviewed   Wt Readings from Last 3 Encounters:  02/05/17 192 lb 3.2 oz (87.2 kg)  01/11/17 189 lb 9.6 oz (86 kg)  06/06/16 188 lb 4.8 oz (85.4 kg)      Other studies personally reviewed: Additional studies/ records that were reviewed today include: prior ekgs (as above, Dr Hilty/ Brett Fairy notes    ASSESSMENT AND PLAN:  1.  Typical atrial flutter Recurrent symptomatic atrial flutter.  I have reviewed all ekgs since 2015 and do not see any documentation of atrial fibrillation Therapeutic strategies for atrial flutter including medicine and ablation were discussed in detail with the patient today. Risk, benefits, and alternatives to EP study and radiofrequency ablation were also discussed in detail today. These risks include but are not limited to stroke, bleeding, vascular damage, tamponade, perforation, damage to the heart and other structures, AV block requiring pacemaker, worsening renal function, and death. The patient understands these risk and wishes to think about this further.  He will contact my office should he decide to proceed. chads2vasc score is 0.  We will therefore not start anticoagulation at this time.  2. ETOH Avoidance encouraged  3. OSA Compliance with CPAP encouraged   Current medicines are reviewed at length with the patient today.   The patient does not have concerns regarding his medicines.  The following changes were made today:  none     Signed, Thompson Grayer, MD  02/05/2017 12:12 PM     [facility] [address] [place] [address] [place] [address] [phone] (office) [phone] (fax)

## 2017-02-12 ENCOUNTER — Ambulatory Visit: Payer: BLUE CROSS/BLUE SHIELD | Admitting: Internal Medicine

## 2017-02-19 ENCOUNTER — Telehealth: Payer: Self-pay | Admitting: *Deleted

## 2017-02-19 NOTE — Telephone Encounter (Signed)
CPAP supply orders signed by Dr. Claiborne Billings and returned to [facility]

## 2017-02-21 ENCOUNTER — Telehealth: Payer: Self-pay | Admitting: Internal Medicine

## 2017-02-21 NOTE — Telephone Encounter (Signed)
Mr. Bubb is calling to  schedule an ablation . He is wanting to schedule for the afternoon if he could . Please call

## 2017-02-23 NOTE — Telephone Encounter (Signed)
Will schedule for 04/13/17 for flutter ablation.  He would like to be second case.

## 2017-02-28 ENCOUNTER — Telehealth: Payer: Self-pay | Admitting: Internal Medicine

## 2017-02-28 ENCOUNTER — Other Ambulatory Visit: Payer: Self-pay

## 2017-02-28 DIAGNOSIS — Z01812 Encounter for preprocedural laboratory examination: Secondary | ICD-10-CM

## 2017-02-28 DIAGNOSIS — I483 Typical atrial flutter: Secondary | ICD-10-CM

## 2017-02-28 MED ORDER — METOPROLOL TARTRATE 25 MG PO TABS: 25.0000 mg | ORAL_TABLET | Freq: Two times a day (BID) | ORAL | 10 refills | Status: DC

## 2017-02-28 NOTE — Telephone Encounter (Signed)
New Message  Pt call requesting to speak with RN about changing Ablation from Feb 8 to Jan 17. Please call back to discuss

## 2017-03-02 DIAGNOSIS — G4733 Obstructive sleep apnea (adult) (pediatric): Secondary | ICD-10-CM | POA: Diagnosis not present

## 2017-03-05 ENCOUNTER — Encounter: Payer: Self-pay | Admitting: Internal Medicine

## 2017-03-05 NOTE — Telephone Encounter (Signed)
This encounter was created in error - please disregard.

## 2017-03-05 NOTE — Telephone Encounter (Signed)
Labs will be on 03/13/17 at 4pm

## 2017-03-05 NOTE — Telephone Encounter (Signed)
Dairl Ponder L at 03/05/2017 12:05 PM   Status: Signed    New Message    Patient calling about Ablation that he has tentatively scheduled for 04/13/2017 that he want to change to 03/22/2017. Please call.

## 2017-03-05 NOTE — Telephone Encounter (Signed)
New Message    Patient calling about Ablation that he has tentatively scheduled for 04/13/2017 that he want to change to 03/22/2017. Please call.

## 2017-03-13 ENCOUNTER — Encounter: Payer: Self-pay | Admitting: *Deleted

## 2017-03-13 ENCOUNTER — Other Ambulatory Visit: Payer: BLUE CROSS/BLUE SHIELD

## 2017-03-13 DIAGNOSIS — Z01812 Encounter for preprocedural laboratory examination: Secondary | ICD-10-CM | POA: Diagnosis not present

## 2017-03-13 DIAGNOSIS — I483 Typical atrial flutter: Secondary | ICD-10-CM | POA: Diagnosis not present

## 2017-03-13 NOTE — Telephone Encounter (Addendum)
Patient is here at the office for instructions for ablation with Dr. Rayann Heman and lab work. Will order lab work and put patient on schedule for labs. Patient given instruction for ablation. Informed patient if there are any additional instructions that Dr. Jackalyn Lombard nurse Claiborne Billings will call him. Patient verbalized understanding.

## 2017-03-13 NOTE — Telephone Encounter (Signed)
This encounter was created in error - please disregard.

## 2017-03-14 LAB — CBC WITH DIFFERENTIAL/PLATELET
BASOS: 1 %
Basophils Absolute: 0 10*3/uL (ref 0.0–0.2)
EOS (ABSOLUTE): 0.1 10*3/uL (ref 0.0–0.4)
EOS: 1 %
HEMATOCRIT: 40 % (ref 37.5–51.0)
HEMOGLOBIN: 13.6 g/dL (ref 13.0–17.7)
IMMATURE GRANS (ABS): 0 10*3/uL (ref 0.0–0.1)
IMMATURE GRANULOCYTES: 0 %
LYMPHS: 23 %
Lymphocytes Absolute: 1.6 10*3/uL (ref 0.7–3.1)
MCH: 34.2 pg — AB (ref 26.6–33.0)
MCHC: 34 g/dL (ref 31.5–35.7)
MCV: 101 fL — ABNORMAL HIGH (ref 79–97)
MONOCYTES: 8 %
Monocytes Absolute: 0.5 10*3/uL (ref 0.1–0.9)
NEUTROS ABS: 4.5 10*3/uL (ref 1.4–7.0)
NEUTROS PCT: 67 %
Platelets: 239 10*3/uL (ref 150–379)
RBC: 3.98 x10E6/uL — ABNORMAL LOW (ref 4.14–5.80)
RDW: 13.7 % (ref 12.3–15.4)
WBC: 6.7 10*3/uL (ref 3.4–10.8)

## 2017-03-14 LAB — BASIC METABOLIC PANEL
BUN / CREAT RATIO: 19 (ref 10–24)
BUN: 20 mg/dL (ref 8–27)
CHLORIDE: 100 mmol/L (ref 96–106)
CO2: 22 mmol/L (ref 20–29)
Calcium: 9.8 mg/dL (ref 8.6–10.2)
Creatinine, Ser: 1.04 mg/dL (ref 0.76–1.27)
GFR calc Af Amer: 88 mL/min/{1.73_m2} (ref >59)
GFR calc non Af Amer: 76 mL/min/{1.73_m2} (ref >59)
GLUCOSE: 85 mg/dL (ref 65–99)
Potassium: 4.6 mmol/L (ref 3.5–5.2)
SODIUM: 139 mmol/L (ref 134–144)

## 2017-03-22 ENCOUNTER — Ambulatory Visit (HOSPITAL_COMMUNITY): Payer: BLUE CROSS/BLUE SHIELD | Admitting: Anesthesiology

## 2017-03-22 ENCOUNTER — Ambulatory Visit (HOSPITAL_COMMUNITY)
Admission: RE | Admit: 2017-03-22 | Discharge: 2017-03-22 | Disposition: A | Discharge status: Home / Self Care | Payer: BLUE CROSS/BLUE SHIELD | Source: Ambulatory Visit | Attending: Internal Medicine | Admitting: Internal Medicine

## 2017-03-22 ENCOUNTER — Encounter (HOSPITAL_COMMUNITY): Payer: Self-pay | Admitting: Anesthesiology

## 2017-03-22 ENCOUNTER — Encounter (HOSPITAL_COMMUNITY)
Admission: RE | Disposition: A | Discharge status: Home / Self Care | Payer: Self-pay | Source: Ambulatory Visit | Attending: Internal Medicine

## 2017-03-22 DIAGNOSIS — Z87891 Personal history of nicotine dependence: Secondary | ICD-10-CM | POA: Insufficient documentation

## 2017-03-22 DIAGNOSIS — I4892 Unspecified atrial flutter: Secondary | ICD-10-CM | POA: Diagnosis not present

## 2017-03-22 DIAGNOSIS — G4733 Obstructive sleep apnea (adult) (pediatric): Secondary | ICD-10-CM | POA: Insufficient documentation

## 2017-03-22 DIAGNOSIS — Z8249 Family history of ischemic heart disease and other diseases of the circulatory system: Secondary | ICD-10-CM | POA: Insufficient documentation

## 2017-03-22 DIAGNOSIS — I483 Typical atrial flutter: Secondary | ICD-10-CM | POA: Diagnosis not present

## 2017-03-22 DIAGNOSIS — I471 Supraventricular tachycardia: Secondary | ICD-10-CM | POA: Diagnosis not present

## 2017-03-22 DIAGNOSIS — E785 Hyperlipidemia, unspecified: Secondary | ICD-10-CM | POA: Insufficient documentation

## 2017-03-22 DIAGNOSIS — Z7982 Long term (current) use of aspirin: Secondary | ICD-10-CM | POA: Insufficient documentation

## 2017-03-22 DIAGNOSIS — Z79899 Other long term (current) drug therapy: Secondary | ICD-10-CM | POA: Diagnosis not present

## 2017-03-22 HISTORY — PX: A-FLUTTER ABLATION: EP1230

## 2017-03-22 SURGERY — A-FLUTTER ABLATION
Anesthesia: Monitor Anesthesia Care

## 2017-03-22 MED ORDER — BUPIVACAINE HCL (PF) 0.25 % IJ SOLN
INTRAMUSCULAR | Status: AC
  Filled 2017-03-22: qty 30

## 2017-03-22 MED ORDER — FENTANYL CITRATE (PF) 100 MCG/2ML IJ SOLN
INTRAMUSCULAR | Status: DC | PRN
  Administered 2017-03-22 (×3): 50 ug via INTRAVENOUS

## 2017-03-22 MED ORDER — PROPOFOL 500 MG/50ML IV EMUL
INTRAVENOUS | Status: DC | PRN
  Administered 2017-03-22: 75 ug/kg/min via INTRAVENOUS

## 2017-03-22 MED ORDER — MIDAZOLAM HCL 5 MG/5ML IJ SOLN
INTRAMUSCULAR | Status: DC | PRN
  Administered 2017-03-22: 2 mg via INTRAVENOUS

## 2017-03-22 MED ORDER — SODIUM CHLORIDE 0.9% FLUSH: 3.0000 mL | Freq: Two times a day (BID) | INTRAVENOUS | Status: DC

## 2017-03-22 MED ORDER — ONDANSETRON HCL 4 MG/2ML IJ SOLN
INTRAMUSCULAR | Status: DC | PRN
  Administered 2017-03-22: 4 mg via INTRAVENOUS

## 2017-03-22 MED ORDER — PROPOFOL 10 MG/ML IV BOLUS
INTRAVENOUS | Status: DC | PRN
  Administered 2017-03-22: 30 mg via INTRAVENOUS
  Administered 2017-03-22: 20 mg via INTRAVENOUS

## 2017-03-22 MED ORDER — HYDROCODONE-ACETAMINOPHEN 5-325 MG PO TABS: 1.0000 | ORAL_TABLET | ORAL | Status: DC | PRN

## 2017-03-22 MED ORDER — SODIUM CHLORIDE 0.9 % IV SOLN: 250.0000 mL | INTRAVENOUS | Status: DC | PRN

## 2017-03-22 MED ORDER — BUPIVACAINE HCL (PF) 0.25 % IJ SOLN
INTRAMUSCULAR | Status: DC | PRN
  Administered 2017-03-22: 30 mL

## 2017-03-22 MED ORDER — SODIUM CHLORIDE 0.9 % IV SOLN
INTRAVENOUS | Status: DC
  Administered 2017-03-22: 07:00:00 via INTRAVENOUS

## 2017-03-22 MED ORDER — SODIUM CHLORIDE 0.9% FLUSH: 3.0000 mL | INTRAVENOUS | Status: DC | PRN

## 2017-03-22 SURGICAL SUPPLY — 13 items
BAG SNAP BAND KOVER 36X36 (MISCELLANEOUS) ×2 IMPLANT
BLANKET WARM UNDERBOD FULL ACC (MISCELLANEOUS) ×2 IMPLANT
CATH DUODECA/ISMUS 7FR REPROC (CATHETERS) ×1 IMPLANT
CATH EZ STEER NAV 8MM F-J CUR (ABLATOR) ×1 IMPLANT
CATH WEBSTER BI DIR CS D-F CRV (CATHETERS) ×1 IMPLANT
PACK EP LATEX FREE (CUSTOM PROCEDURE TRAY) ×2
PACK EP LF (CUSTOM PROCEDURE TRAY) ×1 IMPLANT
PAD DEFIB LIFELINK (PAD) ×2 IMPLANT
PATCH CARTO3 (PAD) ×1 IMPLANT
SHEATH PINNACLE 7F 10CM (SHEATH) ×1 IMPLANT
SHEATH PINNACLE 8F 10CM (SHEATH) ×1 IMPLANT
SHEATH PINNACLE 9F 10CM (SHEATH) ×1 IMPLANT
SHEATH SWARTZ RAMP 8.5F 60CM (SHEATH) ×1 IMPLANT

## 2017-03-22 NOTE — Anesthesia Procedure Notes (Signed)
Procedure Name: MAC Date/Time: 03/22/2017 7:55 AM Performed by: Jenne Campus, CRNA Pre-anesthesia Checklist: Patient identified, Emergency Drugs available, Suction available and Patient being monitored Oxygen Delivery Method: Simple face mask

## 2017-03-22 NOTE — Progress Notes (Addendum)
Site area: RFV x 3 Site Prior to Removal:  Level 0 Pressure Applied For:25 min Manual:   yes Patient Status During Pull:  stable Post Pull Site:  Level 0 Post Pull Instructions Given:  yes Post Pull Pulses Present: palpable Dressing Applied:  tegaderm Bedrest begins @ 6734 till 1645 Comments:

## 2017-03-22 NOTE — Discharge Instructions (Signed)
Monitored Anesthesia Care, Care After These instructions provide you with information about caring for yourself after your procedure. Your health care provider may also give you more specific instructions. Your treatment has been planned according to current medical practices, but problems sometimes occur. Call your health care provider if you have any problems or questions after your procedure. What can I expect after the procedure? After your procedure, it is common to:  Feel sleepy for several hours.  Feel clumsy and have poor balance for several hours.  Feel forgetful about what happened after the procedure.  Have poor judgment for several hours.  Feel nauseous or vomit.  Have a sore throat if you had a breathing tube during the procedure.  Follow these instructions at home: For at least 24 hours after the procedure:   Do not: ? Participate in activities in which you could fall or become injured. ? Drive. ? Use heavy machinery. ? Drink alcohol. ? Take sleeping pills or medicines that cause drowsiness. ? Make important decisions or sign legal documents. ? Take care of children on your own.  Rest. Eating and drinking  Follow the diet that is recommended by your health care provider.  If you vomit, drink water, juice, or soup when you can drink without vomiting.  Make sure you have little or no nausea before eating solid foods. General instructions  Have a responsible adult stay with you until you are awake and alert.  Take over-the-counter and prescription medicines only as told by your health care provider.  If you smoke, do not smoke without supervision.  Keep all follow-up visits as told by your health care provider. This is important. Contact a health care provider if:  You keep feeling nauseous or you keep vomiting.  You feel light-headed.  You develop a rash.  You have a fever. Get help right away if:  You have trouble breathing. This information is  not intended to replace advice given to you by your health care provider. Make sure you discuss any questions you have with your health care provider. Document Released: 06/13/2015 Document Revised: 10/13/2015 Document Reviewed: 06/13/2015 Elsevier Interactive Patient Education  2018 [employer].    No driving for 4 days. No lifting over 5 lbs for 1 week. No sexual activity for 1 week. You may return to work in 1 week. Keep procedure site clean & dry. If you notice increased pain, swelling, bleeding or pus, call/return!  You may shower, but no soaking baths/hot tubs/pools for 1 week.     Cardiac Ablation, Care After This sheet gives you information about how to care for yourself after your procedure. Your health care provider may also give you more specific instructions. If you have problems or questions, contact your health care provider. What can I expect after the procedure? After the procedure, it is common to have:  Bruising around your puncture site.  Tenderness around your puncture site.  Skipped heartbeats.  Tiredness (fatigue).  Follow these instructions at home: Puncture site care  Follow instructions from your health care provider about how to take care of your puncture site. Make sure you: ? Wash your hands with soap and water before you change your bandage (dressing). If soap and water are not available, use hand sanitizer. ? Change your dressing as told by your health care provider. ? Leave stitches (sutures), skin glue, or adhesive strips in place. These skin closures may need to stay in place for up to 2 weeks. If adhesive strip edges  start to loosen and curl up, you may trim the loose edges. Do not remove adhesive strips completely unless your health care provider tells you to do that.  Check your puncture site every day for signs of infection. Check for: ? Redness, swelling, or pain. ? Fluid or blood. If your puncture site starts to bleed, lie down on your back,  apply firm pressure to the area, and contact your health care provider. ? Warmth. ? Pus or a bad smell. Driving  Ask your health care provider when it is safe for you to drive again after the procedure.  Do not drive or use heavy machinery while taking prescription pain medicine.  Do not drive for 24 hours if you were given a medicine to help you relax (sedative) during your procedure. Activity  Avoid activities that take a lot of effort for at least 3 days after your procedure.  Do not lift anything that is heavier than 10 lb (4.5 kg), or the limit that you are told, until your health care provider says that it is safe.  Return to your normal activities as told by your health care provider. Ask your health care provider what activities are safe for you. General instructions  Take over-the-counter and prescription medicines only as told by your health care provider.  Do not use any products that contain nicotine or tobacco, such as cigarettes and e-cigarettes. If you need help quitting, ask your health care provider.  Do not take baths, swim, or use a hot tub until your health care provider approves.  Do not drink alcohol for 24 hours after your procedure.  Keep all follow-up visits as told by your health care provider. This is important. Contact a health care provider if:  You have redness, mild swelling, or pain around your puncture site.  You have fluid or blood coming from your puncture site that stops after applying firm pressure to the area.  Your puncture site feels warm to the touch.  You have pus or a bad smell coming from your puncture site.  You have a fever.  You have chest pain or discomfort that spreads to your neck, jaw, or arm.  You are sweating a lot.  You feel nauseous.  You have a fast or irregular heartbeat.  You have shortness of breath.  You are dizzy or light-headed and feel the need to lie down.  You have pain or numbness in the arm or leg  closest to your puncture site. Get help right away if:  Your puncture site suddenly swells.  Your puncture site is bleeding and the bleeding does not stop after applying firm pressure to the area. These symptoms may represent a serious problem that is an emergency. Do not wait to see if the symptoms will go away. Get medical help right away. Call your local emergency services (911 in the [place] Do not drive yourself to the hospital. Summary  After the procedure, it is normal to have bruising and tenderness at the puncture site in your groin, neck, or forearm.  Check your puncture site every day for signs of infection.  Get help right away if your puncture site is bleeding and the bleeding does not stop after applying firm pressure to the area. This is a medical emergency. This information is not intended to replace advice given to you by your health care provider. Make sure you discuss any questions you have with your health care provider. Document Released: 06/01/2016 Document Revised: 06/01/2016 Document Reviewed:  06/01/2016 Elsevier Interactive Patient Education  [employer].

## 2017-03-22 NOTE — Anesthesia Preprocedure Evaluation (Addendum)
Anesthesia Evaluation  Patient identified by MRN, date of birth, ID band Patient awake    Reviewed: Allergy & Precautions, NPO status , Patient's Chart, lab work & pertinent test results  Airway Mallampati: II  TM Distance: >3 FB Neck ROM: Full    Dental no notable dental hx. (+) Teeth Intact, Dental Advisory Given   Pulmonary neg pulmonary ROS, sleep apnea and Continuous Positive Airway Pressure Ventilation , former smoker,    Pulmonary exam normal breath sounds clear to auscultation       Cardiovascular negative cardio ROS Normal cardiovascular exam Rhythm:Regular Rate:Normal     Neuro/Psych negative neurological ROS  negative psych ROS   GI/Hepatic negative GI ROS, Neg liver ROS,   Endo/Other  negative endocrine ROS  Renal/GU negative Renal ROS  negative genitourinary   Musculoskeletal negative musculoskeletal ROS (+)   Abdominal   Peds negative pediatric ROS (+)  Hematology negative hematology ROS (+)   Anesthesia Other Findings   Reproductive/Obstetrics negative OB ROS                            Anesthesia Physical Anesthesia Plan  ASA: II  Anesthesia Plan:    Post-op Pain Management:    Induction: Intravenous  PONV Risk Score and Plan: Treatment may vary due to age or medical condition, Ondansetron and Dexamethasone  Airway Management Planned: Mask, Natural Airway and Nasal Cannula  Additional Equipment:   Intra-op Plan:   Post-operative Plan:   Informed Consent: I have reviewed the patients History and Physical, chart, labs and discussed the procedure including the risks, benefits and alternatives for the proposed anesthesia with the patient or authorized representative who has indicated his/her understanding and acceptance.   Dental advisory given  Plan Discussed with: CRNA and Anesthesiologist  Anesthesia Plan Comments:         Anesthesia Quick  Evaluation

## 2017-03-22 NOTE — Anesthesia Postprocedure Evaluation (Signed)
Anesthesia Post Note  Patient: Steven Byrd  Procedure(s) Performed: A-FLUTTER ABLATION (N/A )     Patient location during evaluation: PACU Anesthesia Type: MAC Level of consciousness: awake and alert Pain management: pain level controlled Vital Signs Assessment: post-procedure vital signs reviewed and stable Respiratory status: spontaneous breathing, nonlabored ventilation, respiratory function stable and patient connected to nasal cannula oxygen Cardiovascular status: stable and blood pressure returned to baseline Postop Assessment: no apparent nausea or vomiting Anesthetic complications: no    Last Vitals:  Vitals:   03/22/17 1230 03/22/17 1300  BP: 135/85 131/85  Pulse: 76 75  Resp: 17 15  Temp:    SpO2: 98% 100%    Last Pain:  Vitals:   03/22/17 1049  TempSrc: Temporal                 [name]

## 2017-03-22 NOTE — H&P (Signed)
PCP:  Dorothyann Peng, NP        Cardiologist:  Dr Debara Pickett Primary Electrophysiologist: Thompson Grayer, MD          Chief Complaint  Patient presents with  . Atrial Flutter     History of Present Illness: Steven Byrd is a 62 y.o. male who presents today for electrophysiology study and ablation for atrial flutter.     He initially developed atrial flutter (typical) 09/2013 (EKG reviewed).  He was cardioverted.  He was then diagnosed with sleep apnea.  With use of CPAP, his atrial flutter was quiescent until early November.  He returned with symptoms of palpitations and was again found to have typical appearing atrial flutter.  After about 3 weeks, his atrial flutter resolved.  He reports palpitations with his atrial arrhythmia. He remains very active. Today, he denies symptoms of chest pain, shortness of breath, orthopnea, PND, lower extremity edema, claudication, dizziness, presyncope, syncope, bleeding, or neurologic sequela. The patient is tolerating medications without difficulties and is otherwise without complaint today.        Past Medical History:  Diagnosis Date  . Atrial flutter ([place])   . Dyslipidemia   . OSA on CPAP 01/20/2014        Past Surgical History:  Procedure Laterality Date  . NO PAST SURGERIES             Current Outpatient Medications  Medication Sig Dispense Refill  . acyclovir (ZOVIRAX) 800 MG tablet Take 1 tablet (800 mg total) by mouth 3 (three) times daily as needed. 90 tablet 2  . aspirin 81 MG chewable tablet Chew 81 mg by mouth daily.    [name] CALCIUM-MAGNESIUM-ZINC PO Take 1 capsule by mouth daily.    . cholecalciferol (VITAMIN D) 1000 units tablet Take 1,000 Units by mouth daily.    . Coenzyme Q10 (CO Q 10 PO) Take 1 capsule by mouth daily.    . finasteride (PROSCAR) 5 MG tablet TAKE 1/4 OF A TABLET DAILY 10 tablet 3  . latanoprost (XALATAN) 0.005 % ophthalmic solution Place 1 drop into both eyes every evening.  4  . MATZIM LA  240 MG 24 hr tablet TAKE 1 TABLET (240 MG TOTAL) BY MOUTH DAILY. 30 tablet 11  . metoprolol tartrate (LOPRESSOR) 25 MG tablet Take 25 mg by mouth 2 (two) times daily.    . Multiple Vitamin (MULTIVITAMIN) capsule Take 1 capsule by mouth daily.    . sildenafil (VIAGRA) 100 MG tablet Take 1 tablet (100 mg total) by mouth as needed for erectile dysfunction. 10 tablet 10  . simvastatin (ZOCOR) 40 MG tablet TAKE 1 TABLET BY MOUTH AT BEDTIME 90 tablet 3  . TURMERIC PO Take 1 tablet by mouth daily.     No current facility-administered medications for this visit.     Allergies:   Patient has no known allergies.   Social History:  The patient  reports that he quit smoking about 18 years ago. he has never used smokeless tobacco. He reports that he drinks about 8.4 - 12.6 oz of alcohol per week. He reports that he does not use drugs.   Family History:  The patient's  family history includes Diabetes in his father; Heart attack in his maternal grandfather; Hypertension in his mother; Stroke in his father.    ROS:  Please see the history of present illness.   All other systems are personally reviewed and negative.    PHYSICAL EXAM: Vitals:   03/22/17 0554  BP:  129/89  Pulse: 73  Resp: 18  Temp: 98.6 F (37 C)  SpO2: 99%    GEN: Well nourished, well developed, in no acute distress  HEENT: normal  Neck: no JVD, carotid bruits, or masses Cardiac: RRR; no murmurs, rubs, or gallops,no edema  Respiratory:  clear to auscultation bilaterally, normal work of breathing GI: soft, nontender, nondistended, + BS MS: no deformity or atrophy  Skin: warm and dry  Neuro:  Strength and sensation are intact Psych: euthymic mood, full affect   EKG 01/11/17 is reviewed and reveals typical appearing atrial flutter   Recent Labs: 01/11/2017: ALT 16; BUN 18; Creatinine, Ser 1.06; Magnesium 1.9; Potassium 4.7; Sodium 139; TSH 1.560  personally reviewed   Lipid Panel  Labs(Brief)           Component Value Date/Time   CHOL 193 12/22/2015 0817   TRIG 73.0 12/22/2015 0817   HDL 60.90 12/22/2015 0817   CHOLHDL 3 12/22/2015 0817   VLDL 14.6 12/22/2015 0817   LDLCALC 117 (H) 12/22/2015 0817   LDLDIRECT 126.8 02/01/2010 0923     personally reviewed      Wt Readings from Last 3 Encounters:  02/05/17 192 lb 3.2 oz (87.2 kg)  01/11/17 189 lb 9.6 oz (86 kg)  06/06/16 188 lb 4.8 oz (85.4 kg)      ASSESSMENT AND PLAN:  1.  Typical atrial flutter Recurrent symptomatic atrial flutter.  I have reviewed all ekgs since 2015 and do not see any documentation of atrial fibrillation Therapeutic strategies for tachycardia including medicine and ablation were discussed in detail with the patient today. Risk, benefits, and alternatives to EP study and radiofrequency ablation were also discussed in detail today. These risks include but are not limited to stroke, bleeding, vascular damage, tamponade, perforation, damage to the heart and other structures, AV block requiring pacemaker, worsening renal function, and death. The patient understands these risk and wishes to proceed at this time.  Thompson Grayer MD, [facility] 03/22/2017 7:10 AM

## 2017-03-22 NOTE — Transfer of Care (Signed)
Immediate Anesthesia Transfer of Care Note  Patient: Steven Byrd  Procedure(s) Performed: A-FLUTTER ABLATION (N/A )  Patient Location: Cath Lab  Anesthesia Type:MAC  Level of Consciousness: awake, oriented and patient cooperative  Airway & Oxygen Therapy: Patient Spontanous Breathing and Patient connected to face mask oxygen  Post-op Assessment: Report given to RN and Post -op Vital signs reviewed and stable  Post vital signs: Reviewed  Last Vitals:  Vitals:   03/22/17 0954 03/22/17 1000  BP: 112/77 119/75  Pulse: 60 (!) 59  Resp:  13  Temp: 36.8 C   SpO2:  100%    Last Pain:  Vitals:   03/22/17 0954  TempSrc: Tympanic         Complications: No apparent anesthesia complications

## 2017-03-23 ENCOUNTER — Encounter (HOSPITAL_COMMUNITY): Payer: Self-pay | Admitting: Internal Medicine

## 2017-03-26 ENCOUNTER — Telehealth: Payer: Self-pay | Admitting: Internal Medicine

## 2017-03-26 ENCOUNTER — Ambulatory Visit (INDEPENDENT_AMBULATORY_CARE_PROVIDER_SITE_OTHER): Payer: BLUE CROSS/BLUE SHIELD | Admitting: Internal Medicine

## 2017-03-26 ENCOUNTER — Other Ambulatory Visit: Payer: Self-pay | Admitting: Family Medicine

## 2017-03-26 ENCOUNTER — Encounter: Payer: Self-pay | Admitting: Internal Medicine

## 2017-03-26 VITALS — BP 124/82 | HR 65 | Ht 73.0 in | Wt 182.0 lb

## 2017-03-26 DIAGNOSIS — S301XXA Contusion of abdominal wall, initial encounter: Secondary | ICD-10-CM

## 2017-03-26 DIAGNOSIS — E7801 Familial hypercholesterolemia: Secondary | ICD-10-CM

## 2017-03-26 NOTE — Telephone Encounter (Signed)
Follow up    More swelling in groin area and tender since phone call earlier today.  Please call

## 2017-03-26 NOTE — Telephone Encounter (Signed)
°  1. Has your device fired? no  2. Is you device beeping? no 3. Are you experiencing draining or swelling at device site? Swelling  4. Are you calling to see if we received your device transmission?  no 5. Have you passed out? no   Please route to [place]

## 2017-03-26 NOTE — Progress Notes (Signed)
   PCP: Dorothyann Peng, NP Primary Cardiologist: Dr Debara Pickett Primary EP: Dr Gigi Gin Steven Byrd is a 62 y.o. male who presents today for urgent EP follow-up.  He has developed a small R groin hematoma.  He has not been diligent with groin restriction instructions.  He reports that he even went hiking this weekend.    Past Medical History:  Diagnosis Date  . Atrial flutter ([place])   . Dyslipidemia   . OSA on CPAP 01/20/2014   Past Surgical History:  Procedure Laterality Date  . A-FLUTTER ABLATION N/A 03/22/2017   Procedure: A-FLUTTER ABLATION;  Surgeon: [facility] Grayer, MD;  Location: [facility];  Service: Cardiovascular;  Laterality: N/A;  . NO PAST SURGERIES      ROS- all systems are reviewed and negatives except as per HPI above  Current Outpatient Medications  Medication Sig Dispense Refill  . acyclovir (ZOVIRAX) 800 MG tablet Take 1 tablet (800 mg total) by mouth 3 (three) times daily as needed. 90 tablet 2  . aspirin 81 MG chewable tablet Chew 81 mg by mouth daily.    [name] CALCIUM-MAGNESIUM-ZINC PO Take 1 capsule by mouth daily.    . cholecalciferol (VITAMIN D) 1000 units tablet Take 1,000 Units by mouth daily.    . Coenzyme Q10 (CO Q 10 PO) Take 1 capsule by mouth daily.    . finasteride (PROSCAR) 5 MG tablet TAKE 1/4 OF A TABLET DAILY (Patient taking differently: Take 1.25 mg by mouth daily. TAKE 1/4 OF A TABLET DAILY) 10 tablet 3  . latanoprost (XALATAN) 0.005 % ophthalmic solution Place 1 drop into both eyes every evening.  4  . MATZIM LA 240 MG 24 hr tablet TAKE 1 TABLET (240 MG TOTAL) BY MOUTH DAILY. 30 tablet 11  . metoprolol tartrate (LOPRESSOR) 25 MG tablet Take 1 tablet (25 mg total) by mouth 2 (two) times daily. 60 tablet 10  . Multiple Vitamin (MULTIVITAMIN) capsule Take 1 capsule by mouth daily.    . simvastatin (ZOCOR) 40 MG tablet TAKE 1 TABLET BY MOUTH AT BEDTIME 90 tablet 3  . TURMERIC PO Take 1 tablet by mouth daily.     No current facility-administered  medications for this visit.     Physical Exam: Vitals:   03/26/17 1646  BP: 124/82  Pulse: 65  SpO2: 97%  Weight: 182 lb (82.6 kg)  Height: 6\' 1"  (1.854 m)    GEN- The patient is well appearing, alert and oriented x 3 today.   Head- normocephalic, atraumatic Eyes-  Sclera clear, conjunctiva pink Ears- hearing intact Oropharynx- clear Lungs- Clear to ausculation bilaterally, normal work of breathing Heart- Regular rate and rhythm, no murmurs, rubs or gallops, PMI not laterally displaced GI- soft, NT, ND, + BS Extremities- no clubbing, cyanosis, or edema, moderate but soft R groin hematoma, no bruit, not significantly tender   Assessment and Plan:  1. R groin hematoma Due to excessive activity post ablation.  I have again reinforced my origin instructions of no exercising or rigorous activity for a week post ablation. Stop ASA and tumeric at this time. He will contact my office if worsens  2. Atrial flutter No symptoms post ablation  Keep scheduled follow-up  [facility] Grayer MD, [facility] 03/26/2017 5:03 PM

## 2017-03-26 NOTE — Telephone Encounter (Signed)
Patient calling and states that on 1/17 he had an Aflutter ablation. He states that he has some swelling in his right groin at the insertion site about the size of a quarter. He states that there is some bruising as well. Patient denies having any bleeding at the site. Patient also denies having any pain, numbness, tingling, temperature or color changes on the effected side. Made patient aware that some swelling and bruising at the site is normal. Advised patient to continue to monitor and to let us know if his swelling worsens or if he develops any other symptoms. Patient verbalized understanding and thanked me for the call.

## 2017-03-26 NOTE — Telephone Encounter (Signed)
Spoke with patient and added on today for Dr Rayann Heman to assess.

## 2017-04-19 ENCOUNTER — Other Ambulatory Visit: Payer: Self-pay | Admitting: Cardiology

## 2017-04-19 NOTE — Telephone Encounter (Signed)
REFILL 

## 2017-04-23 ENCOUNTER — Ambulatory Visit (INDEPENDENT_AMBULATORY_CARE_PROVIDER_SITE_OTHER): Payer: BLUE CROSS/BLUE SHIELD | Admitting: Internal Medicine

## 2017-04-23 ENCOUNTER — Encounter: Payer: Self-pay | Admitting: Internal Medicine

## 2017-04-23 VITALS — BP 124/64 | HR 60 | Ht 73.0 in | Wt 190.0 lb

## 2017-04-23 DIAGNOSIS — I483 Typical atrial flutter: Secondary | ICD-10-CM

## 2017-04-23 DIAGNOSIS — Z9989 Dependence on other enabling machines and devices: Secondary | ICD-10-CM | POA: Diagnosis not present

## 2017-04-23 DIAGNOSIS — G4733 Obstructive sleep apnea (adult) (pediatric): Secondary | ICD-10-CM

## 2017-04-23 MED ORDER — METOPROLOL TARTRATE 25 MG PO TABS: ORAL_TABLET | ORAL | 3 refills | Status: DC

## 2017-04-23 NOTE — Patient Instructions (Addendum)
Medication Instructions:  Your physician has recommended you make the following change in your medication:  1.  Stop taking aspirin 2.  Stop taking diltiazem 3.  Decrease your metoprolol 25 mg to 1/2 tablet daily x 4 weeks then discontinue.    Labwork: None ordered.  Testing/Procedures: None ordered.  Follow-Up: Your physician wants you to follow-up in: 3 months with Dr. Debara Pickett.   Any Other Special Instructions Will Be Listed Below (If Applicable).  If you need a refill on your cardiac medications before your next appointment, please call your pharmacy.

## 2017-04-23 NOTE — Progress Notes (Signed)
   PCP: Dorothyann Peng, NP Primary Cardiologist: Dr Debara Pickett Primary EP: Dr Gigi Gin Steven Byrd is a 62 y.o. male who presents today for routine electrophysiology followup.  Since last being seen in our clinic, the patient reports doing very well.  He has had no atrial flutter post ablation.  His groin hematoma resolved.  He is doing well.  Today, he denies symptoms of palpitations, chest pain, shortness of breath,  lower extremity edema, dizziness, presyncope, or syncope.  The patient is otherwise without complaint today.   Past Medical History:  Diagnosis Date  . Atrial flutter ([place])   . Dyslipidemia   . OSA on CPAP 01/20/2014   Past Surgical History:  Procedure Laterality Date  . A-FLUTTER ABLATION N/A 03/22/2017   Procedure: A-FLUTTER ABLATION;  Surgeon: [facility] Grayer, MD;  Location: [facility];  Service: Cardiovascular;  Laterality: N/A;  . NO PAST SURGERIES      ROS- all systems are reviewed and negatives except as per HPI above  Current Outpatient Medications  Medication Sig Dispense Refill  . acyclovir (ZOVIRAX) 800 MG tablet Take 1 tablet (800 mg total) by mouth 3 (three) times daily as needed. 90 tablet 2  . aspirin 81 MG chewable tablet Chew 81 mg by mouth daily.    [name] CALCIUM-MAGNESIUM-ZINC PO Take 1 capsule by mouth daily.    . cholecalciferol (VITAMIN D) 1000 units tablet Take 1,000 Units by mouth daily.    . Coenzyme Q10 (CO Q 10 PO) Take 1 capsule by mouth daily.    . finasteride (PROSCAR) 5 MG tablet TAKE 1/4 OF A TABLET DAILY (Patient taking differently: Take 1.25 mg by mouth daily. TAKE 1/4 OF A TABLET DAILY) 10 tablet 3  . latanoprost (XALATAN) 0.005 % ophthalmic solution Place 1 drop into both eyes every evening.  4  . MATZIM LA 240 MG 24 hr tablet TAKE 1 TABLET BY MOUTH EVERY DAY 30 tablet 4  . metoprolol tartrate (LOPRESSOR) 25 MG tablet Take 1 tablet (25 mg total) by mouth 2 (two) times daily. 60 tablet 10  . Multiple Vitamin (MULTIVITAMIN) capsule  Take 1 capsule by mouth daily.    . simvastatin (ZOCOR) 40 MG tablet TAKE 1 TABLET BY MOUTH AT BEDTIME 90 tablet 3  . TURMERIC PO Take 1 tablet by mouth daily.     No current facility-administered medications for this visit.     Physical Exam: Vitals:   04/23/17 1245  BP: 124/64  Pulse: 60  Weight: 190 lb (86.2 kg)  Height: 6\' 1"  (1.854 m)    GEN- The patient is well appearing, alert and oriented x 3 today.   Head- normocephalic, atraumatic Eyes-  Sclera clear, conjunctiva pink Ears- hearing intact Oropharynx- clear Lungs- Clear to ausculation bilaterally, normal work of breathing Heart- Regular rate and rhythm, no murmurs, rubs or gallops, PMI not laterally displaced GI- soft, NT, ND, + BS Extremities- no clubbing, cyanosis, or edema  EKG tracing ordered today is personally reviewed and shows sinus rhythm 60 bpm, otherwise normal ekg  Assessment and Plan:  1. Atrial flutter Resolved s/p ablation chads2vasc score is 0 Stop ASA Stop diltiazem Wean metoprolol to 12.5mg  daily x 4 weeks then stop  2. ETOH Avoidance encouraged  3. OSA Compliance with CPAP encouraged  Follow-up with Dr Debara Pickett in 3 months I will see as needed going forward  [facility] Grayer MD, [facility] 04/23/2017 1:05 PM

## 2017-05-09 ENCOUNTER — Ambulatory Visit: Payer: BLUE CROSS/BLUE SHIELD | Admitting: Internal Medicine

## 2017-06-13 ENCOUNTER — Telehealth: Payer: Self-pay | Admitting: Adult Health

## 2017-06-13 NOTE — Telephone Encounter (Signed)
Spoke to the pt and advised that I have not received a request for medication renewal.  Also advised that he is past due for CPX and lab work.  Pt scheduled for 07/26/17 @ 2:30.  Pt needed later in day appt and stated he can fast.  Will now wait on request.  No further action required.

## 2017-06-13 NOTE — Telephone Encounter (Signed)
Copied from [place] [id number]. Topic: Quick Communication - See Telephone Encounter >> Jun 13, 2017 11:03 AM Hewitt Shorts wrote: CRM for notification. See Telephone encounter for: 06/13/17. Pt is calling to check on status of his viagra rx and was it signed and sent to [facility]  This pharmacy is not on file -this was pt request  Best number [phone]

## 2017-06-26 DIAGNOSIS — H25013 Cortical age-related cataract, bilateral: Secondary | ICD-10-CM | POA: Diagnosis not present

## 2017-06-26 DIAGNOSIS — H04123 Dry eye syndrome of bilateral lacrimal glands: Secondary | ICD-10-CM | POA: Diagnosis not present

## 2017-06-26 DIAGNOSIS — H401131 Primary open-angle glaucoma, bilateral, mild stage: Secondary | ICD-10-CM | POA: Diagnosis not present

## 2017-06-26 DIAGNOSIS — H2513 Age-related nuclear cataract, bilateral: Secondary | ICD-10-CM | POA: Diagnosis not present

## 2017-06-26 LAB — HM DIABETES EYE EXAM

## 2017-07-14 DIAGNOSIS — Y92009 Unspecified place in unspecified non-institutional (private) residence as the place of occurrence of the external cause: Secondary | ICD-10-CM | POA: Diagnosis not present

## 2017-07-14 DIAGNOSIS — R220 Localized swelling, mass and lump, head: Secondary | ICD-10-CM | POA: Diagnosis not present

## 2017-07-14 DIAGNOSIS — S0993XA Unspecified injury of face, initial encounter: Secondary | ICD-10-CM | POA: Diagnosis not present

## 2017-07-14 DIAGNOSIS — S299XXA Unspecified injury of thorax, initial encounter: Secondary | ICD-10-CM | POA: Diagnosis not present

## 2017-07-14 DIAGNOSIS — S63280A Dislocation of proximal interphalangeal joint of right index finger, initial encounter: Secondary | ICD-10-CM | POA: Diagnosis not present

## 2017-07-14 DIAGNOSIS — W108XXA Fall (on) (from) other stairs and steps, initial encounter: Secondary | ICD-10-CM | POA: Diagnosis not present

## 2017-07-14 DIAGNOSIS — S134XXA Sprain of ligaments of cervical spine, initial encounter: Secondary | ICD-10-CM | POA: Diagnosis not present

## 2017-07-14 DIAGNOSIS — M1811 Unilateral primary osteoarthritis of first carpometacarpal joint, right hand: Secondary | ICD-10-CM | POA: Diagnosis not present

## 2017-07-14 DIAGNOSIS — M50322 Other cervical disc degeneration at C5-C6 level: Secondary | ICD-10-CM | POA: Diagnosis not present

## 2017-07-14 DIAGNOSIS — S0101XA Laceration without foreign body of scalp, initial encounter: Secondary | ICD-10-CM | POA: Diagnosis not present

## 2017-07-14 DIAGNOSIS — S0181XA Laceration without foreign body of other part of head, initial encounter: Secondary | ICD-10-CM | POA: Diagnosis not present

## 2017-07-14 DIAGNOSIS — M5030 Other cervical disc degeneration, unspecified cervical region: Secondary | ICD-10-CM | POA: Diagnosis not present

## 2017-07-14 DIAGNOSIS — S335XXA Sprain of ligaments of lumbar spine, initial encounter: Secondary | ICD-10-CM | POA: Diagnosis not present

## 2017-07-14 DIAGNOSIS — M5134 Other intervertebral disc degeneration, thoracic region: Secondary | ICD-10-CM | POA: Diagnosis not present

## 2017-07-14 DIAGNOSIS — M5126 Other intervertebral disc displacement, lumbar region: Secondary | ICD-10-CM | POA: Diagnosis not present

## 2017-07-14 DIAGNOSIS — S63270A Dislocation of unspecified interphalangeal joint of right index finger, initial encounter: Secondary | ICD-10-CM | POA: Diagnosis not present

## 2017-07-14 DIAGNOSIS — S3992XA Unspecified injury of lower back, initial encounter: Secondary | ICD-10-CM | POA: Diagnosis not present

## 2017-07-14 DIAGNOSIS — S199XXA Unspecified injury of neck, initial encounter: Secondary | ICD-10-CM | POA: Diagnosis not present

## 2017-07-14 DIAGNOSIS — S40021A Contusion of right upper arm, initial encounter: Secondary | ICD-10-CM | POA: Diagnosis not present

## 2017-07-17 ENCOUNTER — Other Ambulatory Visit (INDEPENDENT_AMBULATORY_CARE_PROVIDER_SITE_OTHER): Payer: Self-pay

## 2017-07-17 ENCOUNTER — Telehealth (INDEPENDENT_AMBULATORY_CARE_PROVIDER_SITE_OTHER): Payer: Self-pay | Admitting: Orthopaedic Surgery

## 2017-07-17 MED ORDER — HYDROCODONE-ACETAMINOPHEN 5-325 MG PO TABS: 1.0000 | ORAL_TABLET | ORAL | 0 refills | Status: DC | PRN

## 2017-07-17 NOTE — Telephone Encounter (Signed)
OK refill Norco. Needs to be seen Friday, could see one of my partners, forgot I have to go to graduation in [place] and will be OOT.

## 2017-07-17 NOTE — Telephone Encounter (Signed)
Patient needs rx refill on Hydrocodone/acetaminophen 5-325 mg.  Also, per patient Dr. Lorin Mercy wanted him to follow up in office on Friday afternoon, I did not see a schedule for him. Please call patient back if he can be worked in the schedule another day. # [phone]

## 2017-07-17 NOTE — Telephone Encounter (Signed)
Rx request. I can call patient about an appt to be seen.  Is next week ok?  Or should he come tomorrow to see you here in [place] office?

## 2017-07-18 ENCOUNTER — Other Ambulatory Visit (INDEPENDENT_AMBULATORY_CARE_PROVIDER_SITE_OTHER): Payer: Self-pay

## 2017-07-18 ENCOUNTER — Telehealth (INDEPENDENT_AMBULATORY_CARE_PROVIDER_SITE_OTHER): Payer: Self-pay | Admitting: Orthopaedic Surgery

## 2017-07-18 NOTE — Telephone Encounter (Signed)
Can james see this finger dislocation on Friday or ? Noone has availability and Dr Lorin Mercy will be OOT.

## 2017-07-18 NOTE — Telephone Encounter (Signed)
Let's put in on the am, with someone.

## 2017-07-18 NOTE — Telephone Encounter (Signed)
Patient is a friend of Dr. Lorin Mercy and is needing to come in Friday to get his stiches removed and to get his finger looked at. If you could give him a call whenever Dr. Lorin Mercy decides who he would like for him to see CB # [phone]

## 2017-07-18 NOTE — Telephone Encounter (Signed)
Kathlee Nations, can you take care of this one?

## 2017-07-18 NOTE — Telephone Encounter (Signed)
RX ready for pick up at the front desk.

## 2017-07-18 NOTE — Telephone Encounter (Signed)
Called patient to let him know. He is aware about appt and rx being ready for pick up.

## 2017-07-19 ENCOUNTER — Other Ambulatory Visit: Payer: Self-pay | Admitting: Adult Health

## 2017-07-19 NOTE — Telephone Encounter (Signed)
Could you please call patient and offer him an appointment with Jeneen Rinks on Friday afternoon? I spoke with Dr. Lorin Mercy who advised ok to make appt with Jeneen Rinks, get new x-rays, and will need finger buddy taped for support of finger he dislocated after new x-rays are taken.  Thank you.

## 2017-07-19 NOTE — Telephone Encounter (Signed)
Copied from [place] [id number]. Topic: Quick Communication - Rx Refill/Question >> Jul 19, 2017  2:36 PM Bea Graff, NT wrote: Medication: acyclovir (ZOVIRAX) 800 MG tablet Has the patient contacted their pharmacy? Yes.   (Agent: If no, request that the patient contact the pharmacy for the refill.) Preferred Pharmacy (with phone number or street name): [id number] - [place], [place]. AT [address] [phone] (Phone) [phone] (Fax)     Agent: Please be advised that RX refills may take up to 3 business days. We ask that you follow-up with your pharmacy.

## 2017-07-19 NOTE — Telephone Encounter (Signed)
Pt. Calls requesting refill for acyclovir Last refill: 11/10/14 Last OV 4/ 3/18 PCP [facility] on [address].

## 2017-07-19 NOTE — Telephone Encounter (Signed)
Should pt be seen for appt?

## 2017-07-19 NOTE — Telephone Encounter (Signed)
Patient has appt with Marlou Sa already

## 2017-07-20 ENCOUNTER — Encounter: Payer: Self-pay | Admitting: Family Medicine

## 2017-07-20 ENCOUNTER — Ambulatory Visit (INDEPENDENT_AMBULATORY_CARE_PROVIDER_SITE_OTHER): Payer: BLUE CROSS/BLUE SHIELD | Admitting: Orthopedic Surgery

## 2017-07-20 ENCOUNTER — Encounter (INDEPENDENT_AMBULATORY_CARE_PROVIDER_SITE_OTHER): Payer: Self-pay | Admitting: Orthopedic Surgery

## 2017-07-20 DIAGNOSIS — S63250A Unspecified dislocation of right index finger, initial encounter: Secondary | ICD-10-CM

## 2017-07-20 MED ORDER — ACYCLOVIR 800 MG PO TABS: 800.0000 mg | ORAL_TABLET | Freq: Three times a day (TID) | ORAL | 0 refills | Status: DC | PRN

## 2017-07-20 NOTE — Telephone Encounter (Signed)
He can have 30 days but needs to have CPE done

## 2017-07-20 NOTE — Telephone Encounter (Signed)
#  [place].  LETTER MAILED TO PT

## 2017-07-21 ENCOUNTER — Encounter (INDEPENDENT_AMBULATORY_CARE_PROVIDER_SITE_OTHER): Payer: Self-pay | Admitting: Orthopedic Surgery

## 2017-07-21 NOTE — Progress Notes (Signed)
Office Visit Note   Patient: Steven Byrd           Date of Birth: [birth year]           MRN: [id number] Visit Date: 07/20/2017 Requested by: Steven Peng, NP [address] [place], [place] [id number] PCP: [facility] Peng, NP  Subjective: Chief Complaint  Patient presents with  . 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  Here for follow-up of right index finger dislocation.  It was reduced and he was told that there was no fracture.  He has been discussing his case with 1 of my partners Dr. Lorin Byrd who recommended that he come here today for suture removal.  He wants to play golf.  He is going to [place] on May 27.              ROS: All systems reviewed are negative as they relate to the chief complaint within the history of present illness.  Patient denies  fevers or chills.   Assessment & Plan: Visit Diagnoses:  1. Closed dislocation of right index finger     Plan: Impression is right index finger dislocation with predictable stiffness.  He also has lacerations on his head and face the sutures which are removed today.  I recommended that he start occupational therapy for index finger range of motion.  He is going to try to work it out on his own.  If he is not any better when he gets back from [place] I strongly recommend he go for therapy.  He will likely discuss it with his friend Steven Byrd.  I will see him back as needed  Follow-Up Instructions: Return if symptoms worsen or fail to improve.   Orders:  No orders of the defined types were placed in this encounter.  No orders of the defined types were placed in this encounter.     Procedures: No procedures performed   Clinical Data: No additional findings.  Objective: Vital Signs: There were no vitals taken for this visit.  Physical Exam:   Constitutional: Patient appears well-developed HEENT:  Head: Normocephalic Eyes:EOM are  normal Neck: Normal range of motion Cardiovascular: Normal rate Pulmonary/chest: Effort normal Neurologic: Patient is alert Skin: Skin is warm Psychiatric: Patient has normal mood and affect    Ortho Exam: Orthopedic exam demonstrates well-healed incisions on the scalp and face.  Sutures are removed.  Right index finger has sutures in place on the volar aspect but no evidence of infection drainage erythema induration.  He has predictable stiffness in the PIP joint.  Deep and superficial flexor tendons are functional.  He has full extension against gravity.  Also full extension against resistance in that index finger.  Only has about 20 degrees of motion.  Okay to buddy tape these together to start working on range of motion exercises.  Specialty Comments:  No specialty comments available.  Imaging: No results found.   PMFS History: Patient Active Problem List   Diagnosis Date Noted  . Travel advice encounter 11/27/2014  . OSA on CPAP 01/20/2014  . Snoring 10/15/2013  . Witnessed apneic spells 10/15/2013  . Typical atrial flutter ([place]) 09/29/2013  . Erectile dysfunction 01/18/2012  . Benign fasciculations 01/18/2012  . Dyslipidemia 01/07/2007  . 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 Atrial flutter ([place])   . Dyslipidemia   .  OSA on CPAP 01/20/2014    Family History  Problem Relation Age of Onset  . Diabetes Father   . Stroke Father   . Hypertension Mother        ?  [name] Heart attack Maternal Grandfather   . Colon cancer Neg Hx     Past Surgical History:  Procedure Laterality Date  . A-FLUTTER ABLATION N/A 03/22/2017   Procedure: A-FLUTTER ABLATION;  Surgeon: [facility] Grayer, MD;  Location: [facility];  Service: Cardiovascular;  Laterality: N/A;  . NO PAST SURGERIES     Social History   Occupational History  . Not on file  Tobacco Use  . Smoking status: Former Smoker    Last attempt to quit:  10/16/1998    Years since quitting: 18.7  . Smokeless tobacco: Never Used  Substance and Sexual Activity  . Alcohol use: Yes    Alcohol/week: 8.4 - 12.6 oz    Types: 14 - 21 Glasses of wine per week    Comment: red wine  . Drug use: No  . Sexual activity: Not on file

## 2017-07-23 ENCOUNTER — Encounter: Payer: Self-pay | Admitting: Internal Medicine

## 2017-07-23 ENCOUNTER — Ambulatory Visit (INDEPENDENT_AMBULATORY_CARE_PROVIDER_SITE_OTHER): Payer: BLUE CROSS/BLUE SHIELD | Admitting: Internal Medicine

## 2017-07-23 VITALS — BP 132/89 | HR 66 | Ht 73.0 in | Wt 183.2 lb

## 2017-07-23 DIAGNOSIS — G4733 Obstructive sleep apnea (adult) (pediatric): Secondary | ICD-10-CM | POA: Diagnosis not present

## 2017-07-23 DIAGNOSIS — I483 Typical atrial flutter: Secondary | ICD-10-CM

## 2017-07-23 DIAGNOSIS — Z9989 Dependence on other enabling machines and devices: Secondary | ICD-10-CM

## 2017-07-23 NOTE — Progress Notes (Signed)
OFFICE NOTE  Chief Complaint:  Follow-up atrial flutter  Primary Care Physician: Dorothyann Peng, NP  HPI:  Steven Byrd is a 62 y.o. male with a past medical history significant for hyperlipidemia. He has been having recurrent palpitations over the past month, but worse in the past 2 days. He denies chest pain or dyspnea. He has been able to exercise without much difficulty, however, he noted his HR was higher than normal. Typically his resting HR is in the 50-60 range. He went to his PCP who referred him to the ER for an SVT. He was noted to be in 2:1 atrial flutter in the ER. He was given adenosine in the ER which caused a pause, but did not slow his rhythm. He was then given 15 mg IV cardizem and eventually converted back to sinus rhythm. His CHADSVASC score is 0. At discharge I recommended he stay on aspirin and start on long-acting Cardizem. He said that he wished to only take medication as needed. I warned him that he may have recurrent atrial flutter and affect did have a couple of episodes. He called the office and was eventually placed on Cardizem 120 mg 3 times a day. He's been taking this dose and has noted no recurrence of his atrial flutter. He had several questions today about management of atrial flutter and is pretty clearly is not what take medications for long periods of time. In addition he reports that in the past he's had an informal sleep study and underwent a uvulopalatoplasty, but still reports that he is told that he snores, stops breathing and may very well have apnea. Of course this may be a risk factor for his atrial flutter. He did undergo an echocardiogram which showed a normal EF, normal wall motion and normal diastolic function. Wall thickness is also normal. Chamber sizes are normal. There was a top normal descending aortic diameter. Otherwise agree benign echocardiogram. As mentioned in my hospital consult note, he is very athletic, exercises a number days a  week including long-distance running and cycling and he denies any anginal symptoms.  07/23/2017  Steven Byrd returns today for follow-up of atrial flutter.  He recently was evaluated by Dr. Rayann Heman and determined to be candidate for atrial flutter ablation.  He underwent that procedure in January.  This was successful and resolve the symptoms.  He is CHADSVASC score is 0 and he was instructed to come off of aspirin, diltiazem and wean metoprolol.  He is now off of all medications and doing well.  Blood pressures well controlled.  He denies any chest pain or shortness of breath or recurrent palpitations.  PMHx:  Past Medical History:  Diagnosis Date  . Atrial flutter ([place])   . Dyslipidemia   . OSA on CPAP 01/20/2014    Past Surgical History:  Procedure Laterality Date  . A-FLUTTER ABLATION N/A 03/22/2017   Procedure: A-FLUTTER ABLATION;  Surgeon: [facility] Grayer, MD;  Location: [facility];  Service: Cardiovascular;  Laterality: N/A;  . NO PAST SURGERIES      FAMHx:  Family History  Problem Relation Age of Onset  . Diabetes Father   . Stroke Father   . Hypertension Mother        ?  [name] Heart attack Maternal Grandfather   . Colon cancer Neg Hx     SOCHx:   reports that he quit smoking about 18 years ago. He has never used smokeless tobacco. He reports that he drinks about  8.4 - 12.6 oz of alcohol per week. He reports that he does not use drugs.  ALLERGIES:  No Known Allergies  ROS: Pertinent items noted in HPI and remainder of comprehensive ROS otherwise negative.  HOME MEDS: Current Outpatient Medications  Medication Sig Dispense Refill  . acyclovir (ZOVIRAX) 800 MG tablet Take 1 tablet (800 mg total) by mouth 3 (three) times daily as needed. 30 tablet 0  . CALCIUM-MAGNESIUM-ZINC PO Take 1 capsule by mouth daily.    . cholecalciferol (VITAMIN D) 1000 units tablet Take 1,000 Units by mouth daily.    . Coenzyme Q10 (CO Q 10 PO) Take 1 capsule by mouth daily.    .  finasteride (PROSCAR) 5 MG tablet TAKE 1/4 OF A TABLET DAILY (Patient taking differently: Take 1.25 mg by mouth daily. TAKE 1/4 OF A TABLET DAILY) 10 tablet 3  . HYDROcodone-acetaminophen (NORCO/VICODIN) 5-325 MG tablet Take 1-2 tablets by mouth every 4 (four) hours as needed for moderate pain. 25 tablet 0  . latanoprost (XALATAN) 0.005 % ophthalmic solution Place 1 drop into both eyes every evening.  4  . MATZIM LA 240 MG 24 hr tablet TAKE 1 TABLET BY MOUTH EVERY DAY 30 tablet 4  . metoprolol tartrate (LOPRESSOR) 25 MG tablet Take 1/2 tablet daily by mouth x 4 weeks then discontinue 180 tablet 3  . Multiple Vitamin (MULTIVITAMIN) capsule Take 1 capsule by mouth daily.    . simvastatin (ZOCOR) 40 MG tablet TAKE 1 TABLET BY MOUTH AT BEDTIME 90 tablet 3  . TURMERIC PO Take 1 tablet by mouth daily.     No current facility-administered medications for this visit.     LABS/IMAGING: No results found for this or any previous visit (from the past 48 hour(s)). No results found.  VITALS: BP 132/89   Pulse 66   Ht 6\' 1"  (1.854 m)   Wt 183 lb 3.2 oz (83.1 kg)   BMI 24.17 kg/m   EXAM: Deferred  EKG: Sinus rhythm at 66-personally reviewed  ASSESSMENT: 1. Typical atrial flutter - s/p ablation 2. OSA- on CPAP  PLAN: 1.   Steven Byrd has had resolution of his a flutter after ablation in January 2019.  He is now weaned off of all medications.  Due to a low CHADSVASC score of 0 he is now off anticoagulation.  He reports compliance with his CPAP.  He is encouraged to watch alcohol intake.  Overall he is considered unlikely for recurrent atrial flutter events.  He can follow-up with me as needed.  Pixie Casino, MD, [facility], [place] Director of the Advanced Lipid Disorders &  Cardiovascular Risk Reduction Clinic Diplomate of the American Board of Clinical Lipidology Attending Cardiologist  Direct Dial: [phone]  Fax: [phone]  Website:   [email or web address] 07/23/2017, 3:44 PM

## 2017-07-23 NOTE — Patient Instructions (Signed)
Your physician recommends that you schedule a follow-up appointment as needed  

## 2017-07-26 ENCOUNTER — Ambulatory Visit (INDEPENDENT_AMBULATORY_CARE_PROVIDER_SITE_OTHER): Payer: BLUE CROSS/BLUE SHIELD | Admitting: Adult Health

## 2017-07-26 ENCOUNTER — Encounter: Payer: Self-pay | Admitting: Adult Health

## 2017-07-26 VITALS — BP 144/88 | HR 86 | Temp 98.1°F | Ht 71.5 in | Wt 182.2 lb

## 2017-07-26 DIAGNOSIS — Z9989 Dependence on other enabling machines and devices: Secondary | ICD-10-CM

## 2017-07-26 DIAGNOSIS — G4733 Obstructive sleep apnea (adult) (pediatric): Secondary | ICD-10-CM | POA: Diagnosis not present

## 2017-07-26 DIAGNOSIS — Z125 Encounter for screening for malignant neoplasm of prostate: Secondary | ICD-10-CM

## 2017-07-26 DIAGNOSIS — E785 Hyperlipidemia, unspecified: Secondary | ICD-10-CM

## 2017-07-26 DIAGNOSIS — S63250A Unspecified dislocation of right index finger, initial encounter: Secondary | ICD-10-CM

## 2017-07-26 DIAGNOSIS — Z1159 Encounter for screening for other viral diseases: Secondary | ICD-10-CM | POA: Diagnosis not present

## 2017-07-26 DIAGNOSIS — Z0000 Encounter for general adult medical examination without abnormal findings: Secondary | ICD-10-CM

## 2017-07-26 DIAGNOSIS — Z114 Encounter for screening for human immunodeficiency virus [HIV]: Secondary | ICD-10-CM | POA: Diagnosis not present

## 2017-07-26 LAB — CBC WITH DIFFERENTIAL/PLATELET
Basophils Absolute: 0.1 10*3/uL (ref 0.0–0.1)
Basophils Relative: 0.8 % (ref 0.0–3.0)
EOS ABS: 0.1 10*3/uL (ref 0.0–0.7)
Eosinophils Relative: 1 % (ref 0.0–5.0)
HEMATOCRIT: 37.5 % — AB (ref 39.0–52.0)
HEMOGLOBIN: 12.9 g/dL — AB (ref 13.0–17.0)
LYMPHS PCT: 22.1 % (ref 12.0–46.0)
Lymphs Abs: 1.6 10*3/uL (ref 0.7–4.0)
MCHC: 34.5 g/dL (ref 30.0–36.0)
MCV: 100.1 fl — ABNORMAL HIGH (ref 78.0–100.0)
MONO ABS: 0.8 10*3/uL (ref 0.1–1.0)
Monocytes Relative: 10.7 % (ref 3.0–12.0)
Neutro Abs: 4.6 10*3/uL (ref 1.4–7.7)
Neutrophils Relative %: 65.4 % (ref 43.0–77.0)
Platelets: 286 10*3/uL (ref 150.0–400.0)
RBC: 3.75 Mil/uL — AB (ref 4.22–5.81)
RDW: 12.9 % (ref 11.5–15.5)
WBC: 7.1 10*3/uL (ref 4.0–10.5)

## 2017-07-26 LAB — HEPATIC FUNCTION PANEL
ALT: 18 U/L (ref 0–53)
AST: 21 U/L (ref 0–37)
Albumin: 4.2 g/dL (ref 3.5–5.2)
Alkaline Phosphatase: 51 U/L (ref 39–117)
BILIRUBIN DIRECT: 0.2 mg/dL (ref 0.0–0.3)
Total Bilirubin: 1.1 mg/dL (ref 0.2–1.2)
Total Protein: 7.6 g/dL (ref 6.0–8.3)

## 2017-07-26 LAB — BASIC METABOLIC PANEL
BUN: 16 mg/dL (ref 6–23)
CALCIUM: 9.9 mg/dL (ref 8.4–10.5)
CHLORIDE: 100 meq/L (ref 96–112)
CO2: 28 mEq/L (ref 19–32)
Creatinine, Ser: 0.9 mg/dL (ref 0.40–1.50)
GFR: 90.3 mL/min (ref >60.00)
Glucose, Bld: 94 mg/dL (ref 70–99)
Potassium: 4.3 mEq/L (ref 3.5–5.1)
Sodium: 137 mEq/L (ref 135–145)

## 2017-07-26 LAB — LIPID PANEL
CHOL/HDL RATIO: 4
Cholesterol: 202 mg/dL — ABNORMAL HIGH (ref 0–200)
HDL: 55.1 mg/dL (ref >39.00)
LDL CALC: 130 mg/dL — AB (ref 0–99)
NonHDL: 147.19
Triglycerides: 87 mg/dL (ref 0.0–149.0)
VLDL: 17.4 mg/dL (ref 0.0–40.0)

## 2017-07-26 LAB — PSA: PSA: 1.38 ng/mL (ref 0.10–4.00)

## 2017-07-26 LAB — TSH: TSH: 1.41 u[IU]/mL (ref 0.35–4.50)

## 2017-07-26 NOTE — Progress Notes (Signed)
Subjective:    Patient ID: Steven Byrd, male    DOB: [birth year], 62 y.o.   MRN: [id number]  HPI Patient presents for yearly preventative medicine examination. He is a pleasant 62 year old male who  has a past medical history of Atrial flutter ([place]), Dyslipidemia, and OSA on CPAP (01/20/2014).   Atrial Flutter -evaluated by Dr. Rayann Heman and was determined to be a candidate for atrial letter ablation.  He underwent seizure in January 2019.  The ablation was successful resolved his symptoms.  He was able to come off aspirin diltiazem and wean metoprolol, she has since come off.  He denies any chest pain, S of breath or recurrent palpitations.  He has since been seen by Dr. Debara Pickett, 3 days ago and was advised to follow-up as needed.  OSA -CPAP nightly.  Has no issues  Hyperlipidemia -controlled with Zocor 40 mg daily  Lab Results  Component Value Date   CHOL 193 12/22/2015   HDL 60.90 12/22/2015   LDLCALC 117 (H) 12/22/2015   LDLDIRECT 126.8 02/01/2010   TRIG 73.0 12/22/2015   CHOLHDL 3 12/22/2015    All immunizations and health maintenance protocols were reviewed with the patient and needed orders were placed.  Appropriate screening laboratory values were ordered for the patient including screening of hyperlipidemia, renal function and hepatic function. If indicated by BPH, a PSA was ordered.  Medication reconciliation,  past medical history, social history, problem list and allergies were reviewed in detail with the patient  Goals were established with regard to weight loss, exercise, and  diet in compliance with medications.  He eats a heart healthy diet and exercises 5 days a week.  End of life planning was discussed.  Is up-to-date on health maintenance items such as a routine dental and vision screens as well as colonoscopy.  Has no acute complaints today  Interval history includes recent ER visit two weeks ago.  He reports that he was at a wedding at [facility].  1  of the evenings he woke up needed to use the restroom and ended up falling down 17 hardwood stairs.  He reports lacerations to the crown of his head, left cheek, and right index finger with dislocation of said finger.  The ER he had multiple sutures placed and his right index finger was realigned.  He was seen orthopedics on 07/20/2017 and had sutures removed there.  He reports that he continues to have stiffness and decreased range of motion with the right index finger, he was advised by orthopedics to start occupational therapy for range of motion, at that time he declined.  Reports that alcohol was involved at night  Review of Systems  Constitutional: Negative.   HENT: Negative.   Eyes: Negative.   Respiratory: Negative.   Cardiovascular: Negative.   Gastrointestinal: Negative.   Endocrine: Negative.   Genitourinary: Negative.   Musculoskeletal: Negative.   Skin: Negative.   Allergic/Immunologic: Negative.   Neurological: Negative.   Hematological: Negative.   Psychiatric/Behavioral: Negative.   All other systems reviewed and are negative.  Past Medical History:  Diagnosis Date  . Atrial flutter ([place])   . Dyslipidemia   . OSA on CPAP 01/20/2014    Social History   Socioeconomic History  . Marital status: Divorced    Spouse name: Not on file  . Number of children: Not on file  . Years of education: Not on file  . Highest education level: Not on file  Occupational History  . Not  on file  Social Needs  . Financial resource strain: Not on file  . Food insecurity:    Worry: Not on file    Inability: Not on file  . Transportation needs:    Medical: Not on file    Non-medical: Not on file  Tobacco Use  . Smoking status: Former Smoker    Last attempt to quit: 10/16/1998    Years since quitting: 18.7  . Smokeless tobacco: Never Used  Substance and Sexual Activity  . Alcohol use: Yes    Alcohol/week: 8.4 - 12.6 oz    Types: 14 - 21 Glasses of wine per week    Comment: red  wine  . Drug use: No  . Sexual activity: Not on file  Lifestyle  . Physical activity:    Days per week: Not on file    Minutes per session: Not on file  . Stress: Not on file  Relationships  . Social connections:    Talks on phone: Not on file    Gets together: Not on file    Attends religious service: Not on file    Active member of club or organization: Not on file    Attends meetings of clubs or organizations: Not on file    Relationship status: Not on file  . Intimate partner violence:    Fear of current or ex partner: Not on file    Emotionally abused: Not on file    Physically abused: Not on file    Forced sexual activity: Not on file  Other Topics Concern  . 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       He likes to hike, scuba dive, and travel    Past Surgical History:  Procedure Laterality Date  . A-FLUTTER ABLATION N/A 03/22/2017   Procedure: A-FLUTTER ABLATION;  Surgeon: Thompson Grayer, MD;  Location: [facility];  Service: Cardiovascular;  Laterality: N/A;  . NO PAST SURGERIES      Family History  Problem Relation Age of Onset  . Diabetes Father   . Stroke Father   . Hypertension Mother        ?  [name] Heart attack Maternal Grandfather   . Colon cancer Neg Hx     No Known Allergies  Current Outpatient Medications on File Prior to Visit  Medication Sig Dispense Refill  . acyclovir (ZOVIRAX) 800 MG tablet Take 1 tablet (800 mg total) by mouth 3 (three) times daily as needed. 30 tablet 0  . CALCIUM-MAGNESIUM-ZINC PO Take 1 capsule by mouth daily.    . cholecalciferol (VITAMIN D) 1000 units tablet Take 1,000 Units by mouth daily.    . Coenzyme Q10 (CO Q 10 PO) Take 1 capsule by mouth daily.    . finasteride (PROSCAR) 5 MG tablet TAKE 1/4 OF A TABLET DAILY (Patient taking differently: Take 1.25 mg by mouth daily. TAKE 1/4  OF A TABLET DAILY) 10 tablet 3  . HYDROcodone-acetaminophen (NORCO/VICODIN) 5-325 MG tablet Take 1-2 tablets by mouth every 4 (four) hours as needed for moderate pain. 25 tablet 0  . latanoprost (XALATAN) 0.005 % ophthalmic solution Place 1 drop into both eyes every evening.  4  . MATZIM LA 240 MG 24 hr tablet TAKE 1 TABLET BY MOUTH EVERY DAY 30 tablet 4  .  Multiple Vitamin (MULTIVITAMIN) capsule Take 1 capsule by mouth daily.    . simvastatin (ZOCOR) 40 MG tablet TAKE 1 TABLET BY MOUTH AT BEDTIME 90 tablet 3  . TURMERIC PO Take 1 tablet by mouth daily.     No current facility-administered medications on file prior to visit.     BP (!) 144/88 (BP Location: Left Arm, Patient Position: Sitting, Cuff Size: Normal)   Pulse 86   Temp 98.1 F (36.7 C) (Oral)   Ht 5' 11.5" (1.816 m)   Wt 182 lb 3.2 oz (82.6 kg)   SpO2 99%   BMI 25.06 kg/m       Objective:   Physical Exam  Constitutional: He is oriented to person, place, and time. He appears well-developed and well-nourished. No distress.  HENT:  Head: Normocephalic and atraumatic.  Right Ear: External ear normal.  Left Ear: External ear normal.  Nose: Nose normal.  Mouth/Throat: Oropharynx is clear and moist. No oropharyngeal exudate.  Eyes: Pupils are equal, round, and reactive to light. Conjunctivae and EOM are normal. Right eye exhibits no discharge. Left eye exhibits no discharge. No scleral icterus.  Neck: Normal range of motion. Neck supple. No JVD present. No tracheal deviation present. No thyromegaly present.  Cardiovascular: Normal rate, regular rhythm, normal heart sounds and intact distal pulses. Exam reveals no gallop and no friction rub.  No murmur heard. Pulmonary/Chest: Effort normal and breath sounds normal. No stridor. No respiratory distress. He has no wheezes. He has no rales. He exhibits no tenderness.  Abdominal: Soft. Bowel sounds are normal. He exhibits no distension and no mass. There is no tenderness. There is  no rebound and no guarding. No hernia.  Musculoskeletal: He exhibits edema. He exhibits no tenderness or deformity.  Edema and stiffness noted to right index finger.  Lymphadenopathy:    He has no cervical adenopathy.  Neurological: He is alert and oriented to person, place, and time. He displays normal reflexes. No cranial nerve deficit or sensory deficit. He exhibits normal muscle tone. Coordination normal.  Skin: Skin is warm and dry. Capillary refill takes less than 2 seconds. No rash noted. He is not diaphoretic. No erythema.  Well-healing wounds on left cheek and on the crown of his head.  Psychiatric: He has a normal mood and affect. His behavior is normal. Judgment and thought content normal.  Nursing note and vitals reviewed.     Assessment & Plan:  1. Routine general medical examination at a health care facility -Continue with heart healthy diet and regular exercise throughout the week. -Follow up in 1 year or sooner if needed - Basic metabolic panel - CBC with Differential/Platelet - Hepatic function panel - Lipid panel - TSH  2. OSA on CPAP -Compliance.  Continue with CPAP  3. Dyslipidemia -Continue simvastatin 40 mg tablets - Basic metabolic panel - CBC with Differential/Platelet - Hepatic function panel - Lipid panel - TSH  4. Prostate cancer screening  - PSA  5. Need for hepatitis C screening test  - Hep C Antibody  6. Encounter for screening for HIV  - HIV antibody  7. Closed dislocation of right index finger -Is going to [employer] on May 27.  Advised follow-up when he returns at range of motion has not returned.  We will refer to occupational therapy at that time  Dorothyann Peng, NP

## 2017-07-27 LAB — HEPATITIS C ANTIBODY
HEP C AB: NONREACTIVE
SIGNAL TO CUT-OFF: 0.07 (ref <1.00)

## 2017-07-27 LAB — HIV ANTIBODY (ROUTINE TESTING W REFLEX): HIV: NONREACTIVE

## 2017-08-01 ENCOUNTER — Encounter: Payer: Self-pay | Admitting: Family Medicine

## 2017-08-06 ENCOUNTER — Telehealth (INDEPENDENT_AMBULATORY_CARE_PROVIDER_SITE_OTHER): Payer: Self-pay | Admitting: Orthopaedic Surgery

## 2017-08-06 NOTE — Telephone Encounter (Signed)
I texted him.

## 2017-08-06 NOTE — Telephone Encounter (Signed)
Please advise 

## 2017-08-06 NOTE — Telephone Encounter (Signed)
Patient is a friend of Dr. Lorin Mercy he is currently in [place] but will be back in the next few days. He said his pointer finger is really stiff and he was wondering who Dr. Lorin Mercy would suggest him see. He asked for a text since he doesn't get really good reception there, if possible. CB # [phone]

## 2017-08-07 ENCOUNTER — Other Ambulatory Visit: Payer: Self-pay | Admitting: Cardiology

## 2017-09-04 ENCOUNTER — Inpatient Hospital Stay (HOSPITAL_COMMUNITY): Payer: BLUE CROSS/BLUE SHIELD | Admitting: Certified Registered"

## 2017-09-04 ENCOUNTER — Emergency Department (HOSPITAL_COMMUNITY): Payer: BLUE CROSS/BLUE SHIELD

## 2017-09-04 ENCOUNTER — Encounter (HOSPITAL_COMMUNITY)
Admission: EM | Disposition: A | Discharge status: Home / Self Care | Payer: Self-pay | Source: Home / Self Care | Attending: Neurosurgery

## 2017-09-04 ENCOUNTER — Encounter (HOSPITAL_COMMUNITY): Payer: Self-pay | Admitting: Certified Registered"

## 2017-09-04 ENCOUNTER — Inpatient Hospital Stay (HOSPITAL_COMMUNITY)
Admission: EM | Admit: 2017-09-04 | Discharge: 2017-09-10 | DRG: 026 | Disposition: A | Discharge status: Home / Self Care | Payer: BLUE CROSS/BLUE SHIELD | Attending: Neurosurgery | Admitting: Neurosurgery

## 2017-09-04 DIAGNOSIS — E785 Hyperlipidemia, unspecified: Secondary | ICD-10-CM | POA: Diagnosis not present

## 2017-09-04 DIAGNOSIS — G8194 Hemiplegia, unspecified affecting left nondominant side: Secondary | ICD-10-CM | POA: Diagnosis present

## 2017-09-04 DIAGNOSIS — I10 Essential (primary) hypertension: Secondary | ICD-10-CM | POA: Diagnosis not present

## 2017-09-04 DIAGNOSIS — E872 Acidosis: Secondary | ICD-10-CM | POA: Diagnosis present

## 2017-09-04 DIAGNOSIS — R8281 Pyuria: Secondary | ICD-10-CM

## 2017-09-04 DIAGNOSIS — F10231 Alcohol dependence with withdrawal delirium: Secondary | ICD-10-CM | POA: Diagnosis not present

## 2017-09-04 DIAGNOSIS — R61 Generalized hyperhidrosis: Secondary | ICD-10-CM | POA: Diagnosis not present

## 2017-09-04 DIAGNOSIS — Z9989 Dependence on other enabling machines and devices: Secondary | ICD-10-CM

## 2017-09-04 DIAGNOSIS — S065X9A Traumatic subdural hemorrhage with loss of consciousness of unspecified duration, initial encounter: Principal | ICD-10-CM | POA: Diagnosis present

## 2017-09-04 DIAGNOSIS — Z930 Tracheostomy status: Secondary | ICD-10-CM | POA: Diagnosis not present

## 2017-09-04 DIAGNOSIS — R443 Hallucinations, unspecified: Secondary | ICD-10-CM | POA: Diagnosis not present

## 2017-09-04 DIAGNOSIS — I959 Hypotension, unspecified: Secondary | ICD-10-CM | POA: Diagnosis not present

## 2017-09-04 DIAGNOSIS — Z79899 Other long term (current) drug therapy: Secondary | ICD-10-CM | POA: Diagnosis not present

## 2017-09-04 DIAGNOSIS — J9811 Atelectasis: Secondary | ICD-10-CM | POA: Diagnosis not present

## 2017-09-04 DIAGNOSIS — R569 Unspecified convulsions: Secondary | ICD-10-CM | POA: Diagnosis not present

## 2017-09-04 DIAGNOSIS — N179 Acute kidney failure, unspecified: Secondary | ICD-10-CM | POA: Diagnosis not present

## 2017-09-04 DIAGNOSIS — W19XXXA Unspecified fall, initial encounter: Secondary | ICD-10-CM | POA: Diagnosis not present

## 2017-09-04 DIAGNOSIS — S065XAA Traumatic subdural hemorrhage with loss of consciousness status unknown, initial encounter: Secondary | ICD-10-CM

## 2017-09-04 DIAGNOSIS — Z4682 Encounter for fitting and adjustment of non-vascular catheter: Secondary | ICD-10-CM | POA: Diagnosis not present

## 2017-09-04 DIAGNOSIS — G4733 Obstructive sleep apnea (adult) (pediatric): Secondary | ICD-10-CM | POA: Diagnosis not present

## 2017-09-04 DIAGNOSIS — I4891 Unspecified atrial fibrillation: Secondary | ICD-10-CM | POA: Diagnosis present

## 2017-09-04 DIAGNOSIS — Z978 Presence of other specified devices: Secondary | ICD-10-CM

## 2017-09-04 DIAGNOSIS — R740 Nonspecific elevation of levels of transaminase and lactic acid dehydrogenase [LDH]: Secondary | ICD-10-CM | POA: Diagnosis present

## 2017-09-04 DIAGNOSIS — N183 Chronic kidney disease, stage 3 (moderate): Secondary | ICD-10-CM | POA: Diagnosis not present

## 2017-09-04 DIAGNOSIS — R17 Unspecified jaundice: Secondary | ICD-10-CM | POA: Diagnosis not present

## 2017-09-04 DIAGNOSIS — I6200 Nontraumatic subdural hemorrhage, unspecified: Secondary | ICD-10-CM | POA: Diagnosis not present

## 2017-09-04 DIAGNOSIS — R404 Transient alteration of awareness: Secondary | ICD-10-CM | POA: Diagnosis not present

## 2017-09-04 DIAGNOSIS — Z9889 Other specified postprocedural states: Secondary | ICD-10-CM | POA: Diagnosis not present

## 2017-09-04 DIAGNOSIS — N390 Urinary tract infection, site not specified: Secondary | ICD-10-CM | POA: Diagnosis present

## 2017-09-04 DIAGNOSIS — E875 Hyperkalemia: Secondary | ICD-10-CM | POA: Diagnosis not present

## 2017-09-04 DIAGNOSIS — R8271 Bacteriuria: Secondary | ICD-10-CM | POA: Diagnosis present

## 2017-09-04 DIAGNOSIS — I4892 Unspecified atrial flutter: Secondary | ICD-10-CM | POA: Diagnosis not present

## 2017-09-04 DIAGNOSIS — T17908A Unspecified foreign body in respiratory tract, part unspecified causing other injury, initial encounter: Secondary | ICD-10-CM

## 2017-09-04 DIAGNOSIS — R0989 Other specified symptoms and signs involving the circulatory and respiratory systems: Secondary | ICD-10-CM | POA: Diagnosis not present

## 2017-09-04 DIAGNOSIS — G40909 Epilepsy, unspecified, not intractable, without status epilepticus: Secondary | ICD-10-CM | POA: Diagnosis not present

## 2017-09-04 DIAGNOSIS — I6201 Nontraumatic acute subdural hemorrhage: Secondary | ICD-10-CM | POA: Diagnosis not present

## 2017-09-04 DIAGNOSIS — Z87891 Personal history of nicotine dependence: Secondary | ICD-10-CM | POA: Diagnosis not present

## 2017-09-04 DIAGNOSIS — S065X0A Traumatic subdural hemorrhage without loss of consciousness, initial encounter: Secondary | ICD-10-CM | POA: Diagnosis not present

## 2017-09-04 DIAGNOSIS — I484 Atypical atrial flutter: Secondary | ICD-10-CM | POA: Diagnosis not present

## 2017-09-04 HISTORY — PX: CRANIOTOMY: SHX93

## 2017-09-04 LAB — CBC WITH DIFFERENTIAL/PLATELET
ABS IMMATURE GRANULOCYTES: 0.1 10*3/uL (ref 0.0–0.1)
BASOS PCT: 0 %
Basophils Absolute: 0.1 10*3/uL (ref 0.0–0.1)
Eosinophils Absolute: 0 10*3/uL (ref 0.0–0.7)
Eosinophils Relative: 0 %
HEMATOCRIT: 46.8 % (ref 39.0–52.0)
HEMOGLOBIN: 14.6 g/dL (ref 13.0–17.0)
IMMATURE GRANULOCYTES: 1 %
LYMPHS ABS: 1.2 10*3/uL (ref 0.7–4.0)
LYMPHS PCT: 7 %
MCH: 33.8 pg (ref 26.0–34.0)
MCHC: 31.2 g/dL (ref 30.0–36.0)
MCV: 108.3 fL — AB (ref 78.0–100.0)
MONOS PCT: 11 %
Monocytes Absolute: 1.8 10*3/uL — ABNORMAL HIGH (ref 0.1–1.0)
NEUTROS ABS: 12.8 10*3/uL — AB (ref 1.7–7.7)
NEUTROS PCT: 81 %
PLATELETS: 251 10*3/uL (ref 150–400)
RBC: 4.32 MIL/uL (ref 4.22–5.81)
RDW: 13.2 % (ref 11.5–15.5)
WBC: 15.9 10*3/uL — ABNORMAL HIGH (ref 4.0–10.5)

## 2017-09-04 LAB — BASIC METABOLIC PANEL
ANION GAP: 13 (ref 5–15)
BUN: 34 mg/dL — ABNORMAL HIGH (ref 8–23)
CALCIUM: 7.6 mg/dL — AB (ref 8.9–10.3)
CO2: 17 mmol/L — ABNORMAL LOW (ref 22–32)
Chloride: 109 mmol/L (ref 98–111)
Creatinine, Ser: 2.68 mg/dL — ABNORMAL HIGH (ref 0.61–1.24)
GFR, EST AFRICAN AMERICAN: 27 mL/min — AB (ref >60)
GFR, EST NON AFRICAN AMERICAN: 24 mL/min — AB (ref >60)
Glucose, Bld: 130 mg/dL — ABNORMAL HIGH (ref 70–99)
POTASSIUM: 5 mmol/L (ref 3.5–5.1)
SODIUM: 139 mmol/L (ref 135–145)

## 2017-09-04 LAB — COMPREHENSIVE METABOLIC PANEL
ALBUMIN: 4.3 g/dL (ref 3.5–5.0)
ALT: 71 U/L — ABNORMAL HIGH (ref 0–44)
AST: 134 U/L — AB (ref 15–41)
Alkaline Phosphatase: 76 U/L (ref 38–126)
Anion gap: 27 — ABNORMAL HIGH (ref 5–15)
BUN: 26 mg/dL — AB (ref 8–23)
CHLORIDE: 100 mmol/L (ref 98–111)
CO2: 14 mmol/L — ABNORMAL LOW (ref 22–32)
CREATININE: 2.75 mg/dL — AB (ref 0.61–1.24)
Calcium: 9.9 mg/dL (ref 8.9–10.3)
GFR calc Af Amer: 26 mL/min — ABNORMAL LOW (ref >60)
GFR, EST NON AFRICAN AMERICAN: 23 mL/min — AB (ref >60)
GLUCOSE: 73 mg/dL (ref 70–99)
POTASSIUM: 5.8 mmol/L — AB (ref 3.5–5.1)
Sodium: 141 mmol/L (ref 135–145)
Total Bilirubin: 2 mg/dL — ABNORMAL HIGH (ref 0.3–1.2)
Total Protein: 9 g/dL — ABNORMAL HIGH (ref 6.5–8.1)

## 2017-09-04 LAB — RENAL FUNCTION PANEL
ALBUMIN: 3.5 g/dL (ref 3.5–5.0)
Anion gap: 13 (ref 5–15)
BUN: 34 mg/dL — AB (ref 8–23)
CO2: 18 mmol/L — ABNORMAL LOW (ref 22–32)
CREATININE: 2.71 mg/dL — AB (ref 0.61–1.24)
Calcium: 7.7 mg/dL — ABNORMAL LOW (ref 8.9–10.3)
Chloride: 108 mmol/L (ref 98–111)
GFR calc Af Amer: 27 mL/min — ABNORMAL LOW (ref >60)
GFR, EST NON AFRICAN AMERICAN: 23 mL/min — AB (ref >60)
Glucose, Bld: 127 mg/dL — ABNORMAL HIGH (ref 70–99)
PHOSPHORUS: 5.7 mg/dL — AB (ref 2.5–4.6)
POTASSIUM: 5 mmol/L (ref 3.5–5.1)
Sodium: 139 mmol/L (ref 135–145)

## 2017-09-04 LAB — URINALYSIS, ROUTINE W REFLEX MICROSCOPIC
BILIRUBIN URINE: NEGATIVE
GLUCOSE, UA: 50 mg/dL — AB
KETONES UR: 5 mg/dL — AB
LEUKOCYTES UA: NEGATIVE
NITRITE: NEGATIVE
PH: 6 (ref 5.0–8.0)
Protein, ur: 100 mg/dL — AB
Specific Gravity, Urine: 1.011 (ref 1.005–1.030)

## 2017-09-04 LAB — GLUCOSE, CAPILLARY: GLUCOSE-CAPILLARY: 108 mg/dL — AB (ref 70–99)

## 2017-09-04 LAB — I-STAT CG4 LACTIC ACID, ED: LACTIC ACID, VENOUS: 12.66 mmol/L — AB (ref 0.5–1.9)

## 2017-09-04 LAB — POCT I-STAT 3, ART BLOOD GAS (G3+)
ACID-BASE DEFICIT: 5 mmol/L — AB (ref 0.0–2.0)
Bicarbonate: 21.4 mmol/L (ref 20.0–28.0)
O2 Saturation: 100 %
PH ART: 7.302 — AB (ref 7.350–7.450)
TCO2: 23 mmol/L (ref 22–32)
pCO2 arterial: 43.1 mmHg (ref 32.0–48.0)
pO2, Arterial: 397 mmHg — ABNORMAL HIGH (ref 83.0–108.0)

## 2017-09-04 LAB — MAGNESIUM: MAGNESIUM: 2.3 mg/dL (ref 1.7–2.4)

## 2017-09-04 LAB — CBG MONITORING, ED
GLUCOSE-CAPILLARY: 65 mg/dL — AB (ref 70–99)
Glucose-Capillary: 155 mg/dL — ABNORMAL HIGH (ref 70–99)

## 2017-09-04 LAB — PROTIME-INR
INR: 1.04
Prothrombin Time: 13.5 seconds (ref 11.4–15.2)

## 2017-09-04 LAB — TRIGLYCERIDES: TRIGLYCERIDES: 45 mg/dL (ref <150)

## 2017-09-04 LAB — PROCALCITONIN: Procalcitonin: 35.75 ng/mL

## 2017-09-04 LAB — LACTIC ACID, PLASMA: Lactic Acid, Venous: 0.9 mmol/L (ref 0.5–1.9)

## 2017-09-04 LAB — MRSA PCR SCREENING: MRSA by PCR: NEGATIVE

## 2017-09-04 LAB — CK: CK TOTAL: 12810 U/L — AB (ref 49–397)

## 2017-09-04 SURGERY — CRANIOTOMY HEMATOMA EVACUATION SUBDURAL
Anesthesia: General | Site: Head | Laterality: Right

## 2017-09-04 MED ORDER — FINASTERIDE 5 MG PO TABS: 5.0000 mg | ORAL_TABLET | Freq: Every day | ORAL | Status: DC

## 2017-09-04 MED ORDER — BACITRACIN ZINC 500 UNIT/GM EX OINT
TOPICAL_OINTMENT | CUTANEOUS | Status: DC | PRN
  Administered 2017-09-04: 1 via TOPICAL

## 2017-09-04 MED ORDER — METOPROLOL TARTRATE 25 MG PO TABS
25.0000 mg | ORAL_TABLET | Freq: Two times a day (BID) | ORAL | Status: DC
  Administered 2017-09-04 – 2017-09-05 (×2): 25 mg via ORAL
  Filled 2017-09-04 (×5): qty 1

## 2017-09-04 MED ORDER — ADULT MULTIVITAMIN LIQUID CH
15.0000 mL | Freq: Every day | ORAL | Status: DC
  Administered 2017-09-06: 15 mL
  Filled 2017-09-04 (×3): qty 15

## 2017-09-04 MED ORDER — FENTANYL CITRATE (PF) 100 MCG/2ML IJ SOLN
INTRAMUSCULAR | Status: DC | PRN
  Administered 2017-09-04: 100 ug via INTRAVENOUS

## 2017-09-04 MED ORDER — LORAZEPAM 2 MG/ML IJ SOLN
INTRAMUSCULAR | Status: AC
  Filled 2017-09-04: qty 1

## 2017-09-04 MED ORDER — THROMBIN 5000 UNITS EX SOLR
OROMUCOSAL | Status: DC | PRN
  Administered 2017-09-04: 19:00:00 via TOPICAL

## 2017-09-04 MED ORDER — SODIUM CHLORIDE 0.9 % IV SOLN
INTRAVENOUS | Status: DC | PRN
  Administered 2017-09-04: 18:00:00 via INTRAVENOUS

## 2017-09-04 MED ORDER — FOLIC ACID 5 MG/ML IJ SOLN
1.0000 mg | Freq: Every day | INTRAMUSCULAR | Status: DC
  Administered 2017-09-04 – 2017-09-08 (×5): 1 mg via INTRAVENOUS
  Filled 2017-09-04 (×6): qty 0.2

## 2017-09-04 MED ORDER — ETOMIDATE 2 MG/ML IV SOLN: 20.0000 mg | Freq: Once | INTRAVENOUS | Status: DC

## 2017-09-04 MED ORDER — ALBUMIN HUMAN 5 % IV SOLN
INTRAVENOUS | Status: DC | PRN
  Administered 2017-09-04: 19:00:00 via INTRAVENOUS

## 2017-09-04 MED ORDER — THROMBIN 20000 UNITS EX SOLR
CUTANEOUS | Status: AC
  Filled 2017-09-04: qty 20000

## 2017-09-04 MED ORDER — LORAZEPAM 2 MG/ML IJ SOLN: 1.0000 mg | Freq: Once | INTRAMUSCULAR | Status: DC

## 2017-09-04 MED ORDER — SUCCINYLCHOLINE CHLORIDE 20 MG/ML IJ SOLN
INTRAMUSCULAR | Status: AC | PRN
  Administered 2017-09-04: 100 mg via INTRAVENOUS

## 2017-09-04 MED ORDER — LATANOPROST 0.005 % OP SOLN
1.0000 [drp] | Freq: Every evening | OPHTHALMIC | Status: DC
  Administered 2017-09-04 – 2017-09-08 (×4): 1 [drp] via OPHTHALMIC
  Filled 2017-09-04: qty 2.5

## 2017-09-04 MED ORDER — SODIUM CHLORIDE 0.9 % IV SOLN
INTRAVENOUS | Status: DC
  Administered 2017-09-04: 16:00:00 via INTRAVENOUS

## 2017-09-04 MED ORDER — ORAL CARE MOUTH RINSE
15.0000 mL | OROMUCOSAL | Status: DC
  Administered 2017-09-04 – 2017-09-05 (×5): 15 mL via OROMUCOSAL

## 2017-09-04 MED ORDER — FENTANYL CITRATE (PF) 250 MCG/5ML IJ SOLN
INTRAMUSCULAR | Status: AC
  Filled 2017-09-04: qty 5

## 2017-09-04 MED ORDER — FAMOTIDINE IN NACL 20-0.9 MG/50ML-% IV SOLN
20.0000 mg | INTRAVENOUS | Status: DC
  Administered 2017-09-04 – 2017-09-08 (×5): 20 mg via INTRAVENOUS
  Filled 2017-09-04 (×6): qty 50

## 2017-09-04 MED ORDER — FENTANYL 2500MCG IN NS 250ML (10MCG/ML) PREMIX INFUSION
0.0000 ug/h | INTRAVENOUS | Status: DC
Start: 2017-09-04 — End: 2017-09-07
  Administered 2017-09-04: 30 ug/h via INTRAVENOUS
  Administered 2017-09-04: 25 ug/h via INTRAVENOUS
  Filled 2017-09-04: qty 250

## 2017-09-04 MED ORDER — SODIUM CHLORIDE 0.9 % IV BOLUS
1000.0000 mL | Freq: Once | INTRAVENOUS | Status: AC
  Administered 2017-09-04: 1000 mL via INTRAVENOUS

## 2017-09-04 MED ORDER — SODIUM CHLORIDE 0.9 % IV SOLN
INTRAVENOUS | Status: AC | PRN
  Administered 2017-09-04: 1000 mL via INTRAVENOUS

## 2017-09-04 MED ORDER — FAMOTIDINE IN NACL 20-0.9 MG/50ML-% IV SOLN: 20.0000 mg | Freq: Two times a day (BID) | INTRAVENOUS | Status: DC

## 2017-09-04 MED ORDER — ETOMIDATE 2 MG/ML IV SOLN
INTRAVENOUS | Status: AC | PRN
  Administered 2017-09-04: 20 mg via INTRAVENOUS

## 2017-09-04 MED ORDER — BUPIVACAINE-EPINEPHRINE 0.5% -1:200000 IJ SOLN: INTRAMUSCULAR | Status: DC | PRN

## 2017-09-04 MED ORDER — HEMOSTATIC AGENTS (NO CHARGE) OPTIME
TOPICAL | Status: DC | PRN
  Administered 2017-09-04: 1 via TOPICAL

## 2017-09-04 MED ORDER — FLEET ENEMA 7-19 GM/118ML RE ENEM: 1.0000 | ENEMA | Freq: Once | RECTAL | Status: DC | PRN

## 2017-09-04 MED ORDER — DEXTROSE 5 % IV SOLN
INTRAVENOUS | Status: DC | PRN
  Administered 2017-09-04: 25 ug/min via INTRAVENOUS

## 2017-09-04 MED ORDER — 0.9 % SODIUM CHLORIDE (POUR BTL) OPTIME
TOPICAL | Status: DC | PRN
  Administered 2017-09-04: 2000 mL
  Administered 2017-09-04: 1000 mL

## 2017-09-04 MED ORDER — DEXTROSE 50 % IV SOLN
INTRAVENOUS | Status: AC
  Administered 2017-09-04: 50 mL
  Filled 2017-09-04: qty 50

## 2017-09-04 MED ORDER — PROPOFOL 1000 MG/100ML IV EMUL
0.0000 ug/kg/min | INTRAVENOUS | Status: DC
  Administered 2017-09-04: 75 ug/kg/min via INTRAVENOUS
  Administered 2017-09-05 (×2): 30 ug/kg/min via INTRAVENOUS
  Filled 2017-09-04 (×2): qty 100

## 2017-09-04 MED ORDER — ONDANSETRON HCL 4 MG PO TABS: 4.0000 mg | ORAL_TABLET | ORAL | Status: DC | PRN

## 2017-09-04 MED ORDER — PROPOFOL 10 MG/ML IV BOLUS
INTRAVENOUS | Status: AC
  Filled 2017-09-04: qty 20

## 2017-09-04 MED ORDER — THROMBIN 5000 UNITS EX SOLR
CUTANEOUS | Status: AC
  Filled 2017-09-04: qty 5000

## 2017-09-04 MED ORDER — BUPIVACAINE HCL (PF) 0.5 % IJ SOLN
INTRAMUSCULAR | Status: DC | PRN
  Administered 2017-09-04: 7 mL

## 2017-09-04 MED ORDER — SODIUM CHLORIDE 0.9 % IV SOLN
INTRAVENOUS | Status: DC
  Administered 2017-09-04: 21:00:00 via INTRAVENOUS

## 2017-09-04 MED ORDER — POLYETHYLENE GLYCOL 3350 17 G PO PACK: 17.0000 g | PACK | Freq: Every day | ORAL | Status: DC | PRN

## 2017-09-04 MED ORDER — CO Q 10 10 MG PO CAPS: ORAL_CAPSULE | Freq: Every day | ORAL | Status: DC

## 2017-09-04 MED ORDER — ATORVASTATIN CALCIUM 20 MG PO TABS
20.0000 mg | ORAL_TABLET | Freq: Every day | ORAL | Status: DC
  Administered 2017-09-05 – 2017-09-09 (×5): 20 mg via ORAL
  Filled 2017-09-04 (×6): qty 1

## 2017-09-04 MED ORDER — BACITRACIN ZINC 500 UNIT/GM EX OINT
TOPICAL_OINTMENT | CUTANEOUS | Status: AC
  Filled 2017-09-04: qty 28.35

## 2017-09-04 MED ORDER — MULTIVITAMINS PO CAPS: 1.0000 | ORAL_CAPSULE | Freq: Every day | ORAL | Status: DC

## 2017-09-04 MED ORDER — CEFAZOLIN SODIUM-DEXTROSE 1-4 GM/50ML-% IV SOLN
1.0000 g | Freq: Three times a day (TID) | INTRAVENOUS | Status: AC
  Administered 2017-09-05 (×2): 1 g via INTRAVENOUS
  Filled 2017-09-04 (×2): qty 50

## 2017-09-04 MED ORDER — PROMETHAZINE HCL 25 MG PO TABS: 12.5000 mg | ORAL_TABLET | ORAL | Status: DC | PRN

## 2017-09-04 MED ORDER — ONDANSETRON HCL 4 MG/2ML IJ SOLN: 4.0000 mg | INTRAMUSCULAR | Status: DC | PRN

## 2017-09-04 MED ORDER — CEFAZOLIN SODIUM 1 G IJ SOLR
INTRAMUSCULAR | Status: AC
  Filled 2017-09-04: qty 20

## 2017-09-04 MED ORDER — SIMVASTATIN 40 MG PO TABS
40.0000 mg | ORAL_TABLET | Freq: Every day | ORAL | Status: DC
  Filled 2017-09-04: qty 1

## 2017-09-04 MED ORDER — VITAMIN D 1000 UNITS PO TABS
1000.0000 [IU] | ORAL_TABLET | Freq: Every day | ORAL | Status: DC
  Administered 2017-09-05 – 2017-09-10 (×6): 1000 [IU] via ORAL
  Filled 2017-09-04 (×6): qty 1

## 2017-09-04 MED ORDER — CHLORHEXIDINE GLUCONATE 0.12% ORAL RINSE (MEDLINE KIT)
15.0000 mL | Freq: Two times a day (BID) | OROMUCOSAL | Status: DC
  Administered 2017-09-04 – 2017-09-05 (×2): 15 mL via OROMUCOSAL

## 2017-09-04 MED ORDER — ACETAMINOPHEN 650 MG RE SUPP: 650.0000 mg | RECTAL | Status: DC | PRN

## 2017-09-04 MED ORDER — ROCURONIUM BROMIDE 10 MG/ML (PF) SYRINGE
PREFILLED_SYRINGE | INTRAVENOUS | Status: DC | PRN
  Administered 2017-09-04: 50 mg via INTRAVENOUS
  Administered 2017-09-04: 30 mg via INTRAVENOUS
  Administered 2017-09-04: 20 mg via INTRAVENOUS

## 2017-09-04 MED ORDER — LABETALOL HCL 5 MG/ML IV SOLN: 10.0000 mg | INTRAVENOUS | Status: DC | PRN

## 2017-09-04 MED ORDER — BUPIVACAINE HCL (PF) 0.5 % IJ SOLN
INTRAMUSCULAR | Status: AC
  Filled 2017-09-04: qty 30

## 2017-09-04 MED ORDER — HYDROCODONE-ACETAMINOPHEN 5-325 MG PO TABS: 1.0000 | ORAL_TABLET | ORAL | Status: DC | PRN

## 2017-09-04 MED ORDER — DOCUSATE SODIUM 100 MG PO CAPS
100.0000 mg | ORAL_CAPSULE | Freq: Two times a day (BID) | ORAL | Status: DC
  Administered 2017-09-05 – 2017-09-09 (×7): 100 mg via ORAL
  Filled 2017-09-04 (×9): qty 1

## 2017-09-04 MED ORDER — POTASSIUM CHLORIDE IN NACL 20-0.9 MEQ/L-% IV SOLN
INTRAVENOUS | Status: DC
  Filled 2017-09-04: qty 1000

## 2017-09-04 MED ORDER — THROMBIN 20000 UNITS EX SOLR
CUTANEOUS | Status: DC | PRN
  Administered 2017-09-04: 18:00:00 via TOPICAL

## 2017-09-04 MED ORDER — CEFAZOLIN SODIUM-DEXTROSE 2-3 GM-%(50ML) IV SOLR
INTRAVENOUS | Status: DC | PRN
  Administered 2017-09-04: 2 g via INTRAVENOUS

## 2017-09-04 MED ORDER — DILTIAZEM HCL ER COATED BEADS 240 MG PO CP24
240.0000 mg | ORAL_CAPSULE | Freq: Every day | ORAL | Status: DC
  Administered 2017-09-04: 240 mg via ORAL
  Filled 2017-09-04 (×3): qty 1

## 2017-09-04 MED ORDER — THIAMINE HCL 100 MG/ML IJ SOLN
100.0000 mg | Freq: Every day | INTRAMUSCULAR | Status: DC
  Administered 2017-09-05 – 2017-09-08 (×4): 100 mg via INTRAVENOUS
  Filled 2017-09-04 (×4): qty 2

## 2017-09-04 MED ORDER — FENTANYL CITRATE (PF) 100 MCG/2ML IJ SOLN: 100.0000 ug | INTRAMUSCULAR | Status: DC | PRN

## 2017-09-04 MED ORDER — PROPOFOL 500 MG/50ML IV EMUL
INTRAVENOUS | Status: DC | PRN
  Administered 2017-09-04: 50 ug/kg/min via INTRAVENOUS

## 2017-09-04 MED ORDER — MORPHINE SULFATE (PF) 2 MG/ML IV SOLN
1.0000 mg | INTRAVENOUS | Status: DC | PRN
Start: 2017-09-04 — End: 2017-09-04

## 2017-09-04 MED ORDER — FENTANYL CITRATE (PF) 100 MCG/2ML IJ SOLN
100.0000 ug | INTRAMUSCULAR | Status: DC | PRN
  Administered 2017-09-04: 100 ug via INTRAVENOUS

## 2017-09-04 MED ORDER — DEXAMETHASONE SODIUM PHOSPHATE 10 MG/ML IJ SOLN
INTRAMUSCULAR | Status: DC | PRN
  Administered 2017-09-04: 10 mg via INTRAVENOUS

## 2017-09-04 MED ORDER — BISACODYL 10 MG RE SUPP: 10.0000 mg | Freq: Every day | RECTAL | Status: DC | PRN

## 2017-09-04 MED ORDER — ACYCLOVIR 800 MG PO TABS
800.0000 mg | ORAL_TABLET | Freq: Three times a day (TID) | ORAL | Status: DC | PRN
Start: 2017-09-04 — End: 2017-09-04

## 2017-09-04 MED ORDER — MIDAZOLAM HCL 2 MG/2ML IJ SOLN
2.0000 mg | INTRAMUSCULAR | Status: AC | PRN
  Administered 2017-09-04 (×3): 2 mg via INTRAVENOUS
  Filled 2017-09-04 (×2): qty 2

## 2017-09-04 MED ORDER — LIDOCAINE-EPINEPHRINE 1 %-1:100000 IJ SOLN
INTRAMUSCULAR | Status: DC | PRN
  Administered 2017-09-04: 7 mL

## 2017-09-04 MED ORDER — ACETAMINOPHEN 325 MG PO TABS
650.0000 mg | ORAL_TABLET | ORAL | Status: DC | PRN
  Administered 2017-09-05 – 2017-09-08 (×4): 650 mg via ORAL
  Filled 2017-09-04 (×4): qty 2

## 2017-09-04 MED ORDER — LEVETIRACETAM IN NACL 500 MG/100ML IV SOLN
500.0000 mg | Freq: Two times a day (BID) | INTRAVENOUS | Status: DC
  Administered 2017-09-04 – 2017-09-09 (×11): 500 mg via INTRAVENOUS
  Filled 2017-09-04 (×11): qty 100

## 2017-09-04 MED ORDER — LIDOCAINE-EPINEPHRINE 1 %-1:100000 IJ SOLN
INTRAMUSCULAR | Status: AC
  Filled 2017-09-04: qty 1

## 2017-09-04 MED ORDER — SUCCINYLCHOLINE CHLORIDE 20 MG/ML IJ SOLN
100.0000 mg | Freq: Once | INTRAMUSCULAR | Status: DC
  Filled 2017-09-04: qty 5

## 2017-09-04 MED ORDER — MIDAZOLAM HCL 2 MG/2ML IJ SOLN: 2.0000 mg | INTRAMUSCULAR | Status: DC | PRN

## 2017-09-04 MED ORDER — MIDAZOLAM HCL 2 MG/2ML IJ SOLN
INTRAMUSCULAR | Status: AC
  Filled 2017-09-04: qty 2

## 2017-09-04 MED ORDER — ROCURONIUM BROMIDE 10 MG/ML (PF) SYRINGE
PREFILLED_SYRINGE | INTRAVENOUS | Status: AC
  Filled 2017-09-04: qty 10

## 2017-09-04 MED ORDER — FENTANYL CITRATE (PF) 100 MCG/2ML IJ SOLN
INTRAMUSCULAR | Status: AC
  Filled 2017-09-04: qty 2

## 2017-09-04 SURGICAL SUPPLY — 80 items
BASKET BONE COLLECTION (BASKET) IMPLANT
BIT DRILL WIRE PASS 1.3MM (BIT) IMPLANT
BNDG CMPR 75X41 PLY HI ABS (GAUZE/BANDAGES/DRESSINGS)
BNDG GAUZE ELAST 4 BULKY (GAUZE/BANDAGES/DRESSINGS) IMPLANT
BNDG STRETCH 4X75 STRL LF (GAUZE/BANDAGES/DRESSINGS) IMPLANT
BUR ACORN 6.0 PRECISION (BURR) ×2 IMPLANT
BUR SPIRAL ROUTER 2.3 (BUR) ×1 IMPLANT
CANISTER SUCT 3000ML PPV (MISCELLANEOUS) ×3 IMPLANT
CARTRIDGE OIL MAESTRO DRILL (MISCELLANEOUS) ×1 IMPLANT
CATH ROBINSON RED A/P 12FR (CATHETERS) IMPLANT
CLIP VESOCCLUDE MED 6/CT (CLIP) IMPLANT
DECANTER SPIKE VIAL GLASS SM (MISCELLANEOUS) ×3 IMPLANT
DIFFUSER DRILL AIR PNEUMATIC (MISCELLANEOUS) ×2 IMPLANT
DRAIN JACKSON PRATT 10MM FLAT (MISCELLANEOUS) ×1 IMPLANT
DRAIN PENROSE 1/2X12 LTX STRL (WOUND CARE) IMPLANT
DRAPE NEUROLOGICAL W/INCISE (DRAPES) ×2 IMPLANT
DRAPE WARM FLUID 44X44 (DRAPE) ×2 IMPLANT
DRILL WIRE PASS 1.3MM (BIT)
DRSG OPSITE 4X5.5 SM (GAUZE/BANDAGES/DRESSINGS) ×3 IMPLANT
DRSG PAD ABDOMINAL 8X10 ST (GAUZE/BANDAGES/DRESSINGS) IMPLANT
DRSG TELFA 3X8 NADH (GAUZE/BANDAGES/DRESSINGS) ×2 IMPLANT
DURAMATRIX ONLAY 2X2 (Neuro Prosthesis/Implant) ×1 IMPLANT
DURAPREP 6ML APPLICATOR 50/CS (WOUND CARE) ×2 IMPLANT
ELECT REM PT RETURN 9FT ADLT (ELECTROSURGICAL) ×2
ELECTRODE REM PT RTRN 9FT ADLT (ELECTROSURGICAL) ×1 IMPLANT
EVACUATOR SILICONE 100CC (DRAIN) ×1 IMPLANT
GAUZE SPONGE 4X4 12PLY STRL (GAUZE/BANDAGES/DRESSINGS) IMPLANT
GAUZE SPONGE 4X4 16PLY XRAY LF (GAUZE/BANDAGES/DRESSINGS) IMPLANT
GLOVE BIO SURGEON STRL SZ 6.5 (GLOVE) ×4 IMPLANT
GLOVE BIO SURGEON STRL SZ7 (GLOVE) ×1 IMPLANT
GLOVE BIO SURGEON STRL SZ8 (GLOVE) ×2 IMPLANT
GLOVE BIOGEL PI IND STRL 6.5 (GLOVE) IMPLANT
GLOVE BIOGEL PI IND STRL 8 (GLOVE) ×1 IMPLANT
GLOVE BIOGEL PI IND STRL 8.5 (GLOVE) ×1 IMPLANT
GLOVE BIOGEL PI INDICATOR 6.5 (GLOVE) ×1
GLOVE BIOGEL PI INDICATOR 8 (GLOVE) ×1
GLOVE BIOGEL PI INDICATOR 8.5 (GLOVE) ×2
GLOVE ECLIPSE 8.0 STRL XLNG CF (GLOVE) ×2 IMPLANT
GLOVE EXAM NITRILE LRG STRL (GLOVE) IMPLANT
GLOVE EXAM NITRILE XL STR (GLOVE) IMPLANT
GLOVE EXAM NITRILE XS STR PU (GLOVE) IMPLANT
GOWN STRL REUS W/ TWL LRG LVL3 (GOWN DISPOSABLE) IMPLANT
GOWN STRL REUS W/ TWL XL LVL3 (GOWN DISPOSABLE) IMPLANT
GOWN STRL REUS W/TWL 2XL LVL3 (GOWN DISPOSABLE) ×1 IMPLANT
GOWN STRL REUS W/TWL LRG LVL3 (GOWN DISPOSABLE) ×2
GOWN STRL REUS W/TWL XL LVL3 (GOWN DISPOSABLE) ×2
HEMOSTAT POWDER SURGIFOAM 1G (HEMOSTASIS) ×1 IMPLANT
HEMOSTAT SURGICEL 2X14 (HEMOSTASIS) ×2 IMPLANT
IV CATH AUTO 14GX1.75 SAFE ORG (IV SOLUTION) ×1 IMPLANT
KIT BASIN OR (CUSTOM PROCEDURE TRAY) ×2 IMPLANT
KIT TURNOVER KIT B (KITS) ×2 IMPLANT
NDL HYPO 25X1 1.5 SAFETY (NEEDLE) ×1 IMPLANT
NEEDLE HYPO 25X1 1.5 SAFETY (NEEDLE) ×2 IMPLANT
NS IRRIG 1000ML POUR BTL (IV SOLUTION) ×4 IMPLANT
OIL CARTRIDGE MAESTRO DRILL (MISCELLANEOUS) ×2
PACK CRANIOTOMY CUSTOM (CUSTOM PROCEDURE TRAY) ×2 IMPLANT
PAD ARMBOARD 7.5X6 YLW CONV (MISCELLANEOUS) ×4 IMPLANT
PAD DRESSING TELFA 3X8 NADH (GAUZE/BANDAGES/DRESSINGS) IMPLANT
PATTIES SURGICAL .5 X.5 (GAUZE/BANDAGES/DRESSINGS) IMPLANT
PATTIES SURGICAL .5 X3 (DISPOSABLE) IMPLANT
PATTIES SURGICAL 1X1 (DISPOSABLE) IMPLANT
PIN MAYFIELD SKULL DISP (PIN) IMPLANT
PLATE 1.5  2HOLE LNG NEURO (Plate) ×4 IMPLANT
PLATE 1.5 2HOLE LNG NEURO (Plate) IMPLANT
SCREW SELF DRILL HT 1.5/4MM (Screw) ×8 IMPLANT
SPECIMEN JAR SMALL (MISCELLANEOUS) IMPLANT
SPONGE NEURO XRAY DETECT 1X3 (DISPOSABLE) IMPLANT
SPONGE SURGIFOAM ABS GEL 100 (HEMOSTASIS) ×1 IMPLANT
STAPLER SKIN PROX WIDE 3.9 (STAPLE) ×3 IMPLANT
SUT ETHILON 3 0 PS 1 (SUTURE) IMPLANT
SUT NURALON 4 0 TR CR/8 (SUTURE) ×4 IMPLANT
SUT VIC AB 2-0 CP2 18 (SUTURE) ×5 IMPLANT
SUT VIC AB 3-0 SH 8-18 (SUTURE) ×1 IMPLANT
SYR 20CC LL (SYRINGE) ×1 IMPLANT
SYR CONTROL 10ML LL (SYRINGE) IMPLANT
TOWEL GREEN STERILE (TOWEL DISPOSABLE) ×2 IMPLANT
TOWEL GREEN STERILE FF (TOWEL DISPOSABLE) ×2 IMPLANT
TRAY FOLEY MTR SLVR 16FR STAT (SET/KITS/TRAYS/PACK) ×1 IMPLANT
UNDERPAD 30X30 (UNDERPADS AND DIAPERS) IMPLANT
WATER STERILE IRR 1000ML POUR (IV SOLUTION) ×2 IMPLANT

## 2017-09-04 NOTE — Progress Notes (Signed)
RT transported pt to OR from ED with no complications.

## 2017-09-04 NOTE — ED Notes (Signed)
CBG 155 

## 2017-09-04 NOTE — ED Provider Notes (Signed)
[facility] EMERGENCY DEPARTMENT Provider Note   CSN: [id number] Arrival date & time: 09/04/17  1509     History   Chief Complaint Chief Complaint  Patient presents with  . Seizures    HPI Steven Byrd is a 62 y.o. male.  HPI  Patient presents via EMS after a series of witnessed seizures. The patient herself is nonverbal, level 5 caveat. Reported the patient is a generally healthy male, though with a history of atrial fibrillation, who reportedly had a fall 3 weeks ago. The history was provided by a friend who found the patient on the ground actively seizing just prior to calling EMS. From this morning EMS notes that patient had several other witnessed seizures, received a total of 5 mg of Versed and 2 separate 2.5 mg boluses. Patient's last seizure activity was just before arrival to the emergency department. The patient himself slowly follows some commands, by lifting his right arm to questioning, otherwise does not move his extremities, does not interact substantially.  Past Medical History:  Diagnosis Date  . Atrial flutter ([place])   . Dyslipidemia   . OSA on CPAP 01/20/2014    Patient Active Problem List   Diagnosis Date Noted  . Travel advice encounter 11/27/2014  . OSA on CPAP 01/20/2014  . Snoring 10/15/2013  . Witnessed apneic spells 10/15/2013  . Typical atrial flutter ([place]) 09/29/2013  . Erectile dysfunction 01/18/2012  . Benign fasciculations 01/18/2012  . Dyslipidemia 01/07/2007  . 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 A-FLUTTER ABLATION N/A 03/22/2017   Procedure: A-FLUTTER ABLATION;  Surgeon: Thompson Grayer, MD;  Location: [facility];  Service: Cardiovascular;  Laterality: N/A;  . NO PAST SURGERIES          Home Medications    Prior to Admission medications   Medication Sig Start Date End Date Taking? Authorizing Provider  acyclovir  (ZOVIRAX) 800 MG tablet Take 1 tablet (800 mg total) by mouth 3 (three) times daily as needed. 07/20/17   Nafziger, Tommi Rumps, NP  CALCIUM-MAGNESIUM-ZINC PO Take 1 capsule by mouth daily.    [provider]  cholecalciferol (VITAMIN D) 1000 units tablet Take 1,000 Units by mouth daily.    [provider]  Coenzyme Q10 (CO Q 10 PO) Take 1 capsule by mouth daily.    [provider]  finasteride (PROSCAR) 5 MG tablet TAKE 1/4 OF A TABLET DAILY Patient taking differently: Take 1.25 mg by mouth daily. TAKE 1/4 OF A TABLET DAILY 12/29/15   Nafziger, Tommi Rumps, NP  HYDROcodone-acetaminophen (NORCO/VICODIN) 5-325 MG tablet Take 1-2 tablets by mouth every 4 (four) hours as needed for moderate pain. 07/17/17   Marybelle Killings, MD  latanoprost (XALATAN) 0.005 % ophthalmic solution Place 1 drop into Byrd eyes every evening. 09/26/15   [provider]  MATZIM LA 240 MG 24 hr tablet TAKE 1 TABLET BY MOUTH EVERY DAY 08/07/17   Erlene Quan, PA-C  Multiple Vitamin (MULTIVITAMIN) capsule Take 1 capsule by mouth daily.    [provider]  simvastatin (ZOCOR) 40 MG tablet TAKE 1 TABLET BY MOUTH AT BEDTIME 03/26/17   Dorena Cookey, MD  TURMERIC PO Take 1 tablet by mouth daily.    [provider]    Family History Family History  Problem Relation Age of Onset  . Diabetes Father   . Stroke Father   . Hypertension Mother        ?  [name]  Heart attack Maternal Grandfather   . Colon cancer Neg Hx     Social History Social History   Tobacco Use  . Smoking status: Former Smoker    Last attempt to quit: 10/16/1998    Years since quitting: 18.8  . Smokeless tobacco: Never Used  Substance Use Topics  . Alcohol use: Yes    Alcohol/week: 8.4 - 12.6 oz    Types: 14 - 21 Glasses of wine per week    Comment: red wine  . Drug use: No     Allergies   Patient has no known allergies.   Review of Systems Review of Systems  Unable to perform ROS: Acuity of condition      Physical Exam Updated Vital Signs Wt 82.6 kg (182 lb)   SpO2 99%   BMI 25.03 kg/m   Physical Exam  Constitutional: He appears well-developed.  HENT:  Head: Normocephalic and atraumatic.  Prior trauma about the scalp  Eyes: Conjunctivae are normal.  Pupils left greater than right  Cardiovascular: Normal rate and regular rhythm.  Pulmonary/Chest: Effort normal. No stridor. No respiratory distress.  Abdominal: He exhibits no distension.  Musculoskeletal: He exhibits no deformity.  Neurological:  Patient flaccid on the left, areflexic left distal lower extremity, moves the right lower extremity minimally, spontaneously.  On patient does move his right arm occasionally consistently with questioning  Skin: Skin is warm and dry.  Psychiatric: Cognition and memory are impaired.  Nursing note and vitals reviewed.    ED Treatments / Results  Labs (all labs ordered are listed, but only abnormal results are displayed) Labs Reviewed  CBC WITH DIFFERENTIAL/PLATELET - Abnormal; Notable for the following components:      Result Value   WBC 15.9 (*)    MCV 108.3 (*)    Neutro Abs 12.8 (*)    Monocytes Absolute 1.8 (*)    All other components within normal limits  CBG MONITORING, ED - Abnormal; Notable for the following components:   Glucose-Capillary 65 (*)    All other components within normal limits  I-STAT CG4 LACTIC ACID, ED - Abnormal; Notable for the following components:   Lactic Acid, Venous 12.66 (*)    All other components within normal limits  URINE CULTURE  PROTIME-INR  COMPREHENSIVE METABOLIC PANEL  URINALYSIS, ROUTINE W REFLEX MICROSCOPIC  URINALYSIS, COMPLETE (UACMP) WITH MICROSCOPIC    EKG EKG Interpretation  Date/Time:  Tuesday September 04 2017 15:27:46 EDT Ventricular Rate:  108 PR Interval:    QRS Duration: 79 QT Interval:  310 QTC Calculation: 416 R Axis:   57 Text Interpretation:  Sinus tachycardia Abnormal R-wave progression, early transition  Abnormal ekg Confirmed by Carmin Muskrat [phone]) on 09/04/2017 5:38:02 PM   Radiology Ct Head Wo Contrast  Result Date: 09/04/2017 CLINICAL DATA:  Initial evaluation for new onset seizure, left-sided weakness. EXAM: CT HEAD WITHOUT CONTRAST TECHNIQUE: Contiguous axial images were obtained from the base of the skull through the vertex without intravenous contrast. COMPARISON:  None. FINDINGS: Brain: Acute mixed density subdural hematoma overlying the right cerebral convexity measures up to 15 mm in thickness. Additional small acute subdural hemorrhage overlies the left frontal convexity, measuring up to 5 mm in maximal thickness. Extension along the falx and left tentorium, with blood seen at the foramen magnum. Associated mass effect with 5 mm of right-to-left shift. No hydrocephalus or ventricular trapping. Basilar cisterns remain patent. No acute large vessel territory infarct. No mass lesion. No other acute intracranial abnormality. Vascular: No asymmetric hyperdense  vessel. Skull: Probable small soft tissue contusion at the left frontal scalp. Calvarium intact. Sinuses/Orbits: Globes and orbital soft tissues demonstrate no acute finding. Note made of a 7 mm hypodensity at the posterior left orbit (series 3, image 15), indeterminate, possibly a small varix. Paranasal sinuses and mastoid air cells are largely clear. Other: None. IMPRESSION: 1. Acute mixed density right holo hemispheric subdural hematoma measuring up to 15 mm in maximal thickness. Associated mass effect with 5 mm right-to-left shift. No hydrocephalus or ventricular trapping. 2. Additional small left subdural hematoma measuring up to 5 mm without significant mass effect. 3. Small left frontal scalp contusion.  Calvarium intact. 4. 7 mm hyperdense lesion at the posterior left orbit, indeterminate, but may reflect a small venous varix. Finding of doubtful significance in the acute setting. Critical Value/emergent results were called by telephone  at the time of interpretation on 09/04/2017 at 3:40 pm to Dr. Tomi Bamberger , who verbally acknowledged these results. Electronically Signed   By: Jeannine Boga M.D.   On: 09/04/2017 15:42   Dg Chest Port 1 View  Result Date: 09/04/2017 CLINICAL DATA:  Mild third mental status. EXAM: PORTABLE CHEST 1 VIEW COMPARISON:  None. FINDINGS: Endotracheal tube in place with the tip approximately 4.5 cm above the carina. Enteric tube in the stomach. The heart size and mediastinal contours are within normal limits. Normal pulmonary vascularity. No focal consolidation, pleural effusion, or pneumothorax. No acute osseous abnormality. IMPRESSION: 1. Appropriately positioned endotracheal and enteric tubes. 2. No active disease. Electronically Signed   By: Titus Dubin M.D.   On: 09/04/2017 15:47    Procedures Procedures (including critical care time)  Medications Ordered in ED Medications  sodium chloride 0.9 % bolus 1,000 mL (1,000 mLs Intravenous New Bag/Given 09/04/17 1544)    And  0.9 %  sodium chloride infusion (has no administration in time range)  LORazepam (ATIVAN) injection 1 mg (has no administration in time range)  LORazepam (ATIVAN) 2 MG/ML injection (has no administration in time range)  etomidate (AMIDATE) injection 20 mg (has no administration in time range)  succinylcholine (ANECTINE) injection 100 mg (has no administration in time range)  midazolam (VERSED) injection 2 mg (has no administration in time range)  midazolam (VERSED) injection 2 mg (has no administration in time range)  fentaNYL (SUBLIMAZE) injection 100 mcg (100 mcg Intravenous Given 09/04/17 1548)  fentaNYL (SUBLIMAZE) injection 100 mcg (has no administration in time range)  fentaNYL (SUBLIMAZE) 100 MCG/2ML injection (has no administration in time range)  midazolam (VERSED) 2 MG/2ML injection (has no administration in time range)  etomidate (AMIDATE) injection (20 mg Intravenous Given 09/04/17 1530)  succinylcholine (ANECTINE)  injection (100 mg Intravenous Given 09/04/17 1532)  dextrose 50 % solution (50 mLs  Given 09/04/17 1542)     Initial Impression / Assessment and Plan / ED Course  I have reviewed the triage vital signs and the nursing notes.  Pertinent labs & imaging results that were available during my care of the patient were reviewed by me and considered in my medical decision making (see chart for details).    Immediately after initial evaluation with concern for head bleed patient had expeditious head CT.  I reviewed the initial imaging, and with concern for subdural hematoma, paged neurosurgery. Subsequently, given the patient's listlessness, impaired neurologic status, and concern for respiratory compromise, airway protection, the patient was intubated. This was performed without complication, on first pass with video laryngoscopy.  INTUBATION Performed by: Carmin Muskrat  Required items: required blood products,  implants, devices, and special equipment available Patient identity confirmed: provided demographic data and hospital-assigned identification number Time out: Immediately prior to procedure a "time out" was called to verify the correct patient, procedure, equipment, support staff and site/side marked as required.  Indications: airway protection  Intubation method: Glidescope Laryngoscopy   Preoxygenation: BVM  Sedatives: 20Etomidate Paralytic: 100Succinylcholine  Tube Size: 7.5 cuffed  Post-procedure assessment: chest rise and ETCO2 monitor Breath sounds: equal and absent over the epigastrium Tube secured with: ETT holder Chest x-ray interpreted by radiologist and me.  Chest x-ray findings: endotracheal tube in appropriate position  Patient tolerated the procedure well with no immediate complications.  Update: After intubation I discussed patient's case with our neurosurgery colleagues, patient will be prepared for operative intervention for his subdural  hematoma. Subsequently the patient was joined by several fans, significant other, and I attempted to contact his daughter. They note that the patient had a fall about 6 weeks ago, requiring suture repair. The note that CT scan at that point did not demonstrate intracranial hemorrhage. See the patient is otherwise generally well, had been functional until the past few days.  5:39 PM Patient remains mildly hypotensive, sedated, with fentanyl continuously, Versed pushes.   Patient is being prepared to go to the OR for emergent craniotomy for his bleed.  Final Clinical Impressions(s) / ED Diagnoses  Subdural hematoma  CRITICAL CARE Performed by: Carmin Muskrat Total critical care time: 40 minutes Critical care time was exclusive of separately billable procedures and treating other patients. Critical care was necessary to treat or prevent imminent or life-threatening deterioration. Critical care was time spent personally by me on the following activities: development of treatment plan with patient and/or surrogate as well as nursing, discussions with consultants, evaluation of patient's response to treatment, examination of patient, obtaining history from patient or surrogate, ordering and performing treatments and interventions, ordering and review of laboratory studies, ordering and review of radiographic studies, pulse oximetry and re-evaluation of patient's condition.    Carmin Muskrat, MD 09/04/17 1740

## 2017-09-04 NOTE — Brief Op Note (Addendum)
09/04/2017  7:45 PM  PATIENT:  Steven Byrd  62 y.o. male  PRE-OPERATIVE DIAGNOSIS:  Right Subdural Hematoma  POST-OPERATIVE DIAGNOSIS:  Right Subdural Hematoma  PROCEDURE:  Procedure(s): CRANIOTOMY FOR SUBDURAL HEMATOMA (Right)  SURGEON:  Surgeon(s) and Role:    Erline Levine, MD - Primary  PHYSICIAN ASSISTANT:   ASSISTANTS: Poteat, RN   ANESTHESIA:   general  EBL:  100 mL   BLOOD ADMINISTERED:none  DRAINS: (10) Jackson-Pratt drain(s) with closed bulb suction in the subdural space   LOCAL MEDICATIONS USED:  MARCAINE    and LIDOCAINE   SPECIMEN:  No Specimen  DISPOSITION OF SPECIMEN:  N/A  COUNTS:  YES  TOURNIQUET:  * No tourniquets in log *  DICTATION: DICTATION: Patient is 62 year old man who fell and struck his head. The patient was found down, having seized. Head CT shows panhemispheric right SDH with mass effect and early shift.It was elected to take patient to surgery for craniotomy for SDH.  Procedure:  Following smooth intubation, patient was placed in semi-lateral position with blanket roll.  Head was placed on donut head holder and right frontal scalp was shaved and prepped and draped in usual sterile fashion.  Area of planned incision was infiltrated with lidocaine. A curvilinear incision was made and carried through temporalis fascia and muscle to expose calvarium.  Skull flap was elevated exposing subdural hematoma.  Dura was opened and subdural was evacuated.  This was of multiple ages with chronic blood on the surface and organizing thick hematoma below membranes.   This was evacuated and a considerable amount of blood was removed. The brain was irrigated.    Hemostasis was assured. The brain came back up after hematoma evacuation.  The dura was closed with 4-0 neurilon sutures after placing a #10 JP drain, bone flap was replaced with plates, and tack up sutures were placed.  the fascia and galea were closed with 2-0 vicryl sutures and the skin was re  approximated with staples.  A sterile occlusive dressing was placed.  Patient was taken to the ICU in stable condition having tolerated his operation well. He was still intubated.  PLAN OF CARE: Admit to inpatient   PATIENT DISPOSITION:  PACU - hemodynamically stable.   Delay start of Pharmacological VTE agent (>24hrs) due to surgical blood loss or risk of bleeding: yes

## 2017-09-04 NOTE — Progress Notes (Signed)
Received patient from OR via [place]. Placed on full support mechanical ventilation 57ml/kg 640, Rate 14 (per OR settings) 100% FIO2 Peep 5. Ambu bag at [facility]. ETT secured 25 cm @ lip. RT will obtain ABG one hour post ventilation.

## 2017-09-04 NOTE — ED Triage Notes (Addendum)
Patient arrived by ems, from home, family and friends found him seizing on the floor head down in a puddle of foam. No hx of seizures per family friends although per ems they stated he fell 6 weeks ago and had to get stiches while he was at [place]. Hx of afib not on blood thinners. Per ems left pupil 5, r pupil 3.  EMS treatments:  VS 98/70 CBG: 60 P 112 EKG sinus tach 18G L AC, 5mg  midazolam given   Current meds: diazepam, Cardizem, metoprolol, simvastatin

## 2017-09-04 NOTE — Op Note (Addendum)
09/04/2017  7:45 PM  PATIENT:  Steven Byrd  62 y.o. male  PRE-OPERATIVE DIAGNOSIS:  Right Subdural Hematoma  POST-OPERATIVE DIAGNOSIS:  Right Subdural Hematoma  PROCEDURE:  Procedure(s): CRANIOTOMY FOR SUBDURAL HEMATOMA (Right)  SURGEON:  Surgeon(s) and Role:    Erline Levine, MD - Primary  PHYSICIAN ASSISTANT:   ASSISTANTS: Poteat, RN   ANESTHESIA:   general  EBL:  100 mL   BLOOD ADMINISTERED:none  DRAINS: (10) Jackson-Pratt drain(s) with closed bulb suction in the subdural space   LOCAL MEDICATIONS USED:  MARCAINE    and LIDOCAINE   SPECIMEN:  No Specimen  DISPOSITION OF SPECIMEN:  N/A  COUNTS:  YES  TOURNIQUET:  * No tourniquets in log *  DICTATION: DICTATION: Patient is 62 year old man who fell and struck his head. The patient was found down, having seized. Head CT shows panhemispheric right SDH with mass effect and early shift.It was elected to take patient to surgery for craniotomy for SDH.  Procedure:  Following smooth intubation, patient was placed in semi-lateral position with blanket roll.  Head was placed on donut head holder and right frontal scalp was shaved and prepped and draped in usual sterile fashion.  Area of planned incision was infiltrated with lidocaine. A curvilinear incision was made and carried through temporalis fascia and muscle to expose calvarium.  Skull flap was elevated exposing subdural hematoma.  Dura was opened and subdural was evacuated.  This was of multiple ages with chronic blood on the surface and organizing thick hematoma below membranes.   This was evacuated and a considerable amount of blood was removed. The brain was irrigated.    Hemostasis was assured. The brain came back up after hematoma evacuation.  The dura was closed with 4-0 neurilon sutures after placing a #10 JP drain, bone flap was replaced with plates, and tack up sutures were placed.  the fascia and galea were closed with 2-0 vicryl sutures and the skin was re  approximated with staples.  A sterile occlusive dressing was placed.  Patient was taken to the ICU in stable condition having tolerated his operation well. He was still intubated.  PLAN OF CARE: Admit to inpatient   PATIENT DISPOSITION:  PACU - hemodynamically stable.   Delay start of Pharmacological VTE agent (>24hrs) due to surgical blood loss or risk of bleeding: yes

## 2017-09-04 NOTE — Consult Note (Addendum)
PULMONARY / CRITICAL CARE MEDICINE   Name: Steven Byrd MRN: [id number] DOB: 07-21-1955    ADMISSION DATE:  09/04/2017 CONSULTATION DATE:  09/04/2017  REFERRING MD:  Dr. Vertell Limber  CHIEF COMPLAINT:  SDH/ seizures  HISTORY OF PRESENT ILLNESS:   HPI obtained from medical chart review as patient is sedated on mechanical ventilation.  62 year old male with PMH significant for atrial flutter s/p ablation 03/22/2017 and since of anticoagulation and ASA, HLD, and OSA on CPAP who presented with seizures.  [place].  Was found actively seizing on ground this morning by family followed by several witnessed seizures.  No prior history of seizures.  Was able to slowly follow some commands mostly with his right upper arm in ER.  Found to on CT head to have large right sided subdural hematoma and therefore taken emergently to OR for craniotomy.  PCCM consulted for ventilator management.    PAST MEDICAL HISTORY :  He  has a past medical history of Atrial flutter ([place]), Dyslipidemia, and OSA on CPAP (01/20/2014).  PAST SURGICAL HISTORY: He  has a past surgical history that includes No past surgeries and A-FLUTTER ABLATION (N/A, 03/22/2017).  No Known Allergies  No current facility-administered medications on file prior to encounter.    Current Outpatient Medications on File Prior to Encounter  Medication Sig  . MATZIM LA 240 MG 24 hr tablet TAKE 1 TABLET BY MOUTH EVERY DAY  . metoprolol tartrate (LOPRESSOR) 25 MG tablet Take 25 mg by mouth 2 (two) times daily.  . simvastatin (ZOCOR) 40 MG tablet TAKE 1 TABLET BY MOUTH AT BEDTIME  . acyclovir (ZOVIRAX) 800 MG tablet Take 1 tablet (800 mg total) by mouth 3 (three) times daily as needed. (Patient not taking: Reported on 09/04/2017)  . CALCIUM-MAGNESIUM-ZINC PO Take 1 capsule by mouth daily.  . cholecalciferol (VITAMIN D) 1000 units tablet Take 1,000 Units by mouth daily.  .  Coenzyme Q10 (CO Q 10 PO) Take 1 capsule by mouth daily.  . finasteride (PROSCAR) 5 MG tablet TAKE 1/4 OF A TABLET DAILY (Patient not taking: Reported on 09/04/2017)  . HYDROcodone-acetaminophen (NORCO/VICODIN) 5-325 MG tablet Take 1-2 tablets by mouth every 4 (four) hours as needed for moderate pain. (Patient not taking: Reported on 09/04/2017)  . latanoprost (XALATAN) 0.005 % ophthalmic solution Place 1 drop into both eyes every evening.  . Multiple Vitamin (MULTIVITAMIN) capsule Take 1 capsule by mouth daily.  . TURMERIC PO Take 1 tablet by mouth daily.    FAMILY HISTORY:  His indicated that the status of his mother is unknown. He indicated that the status of his father is unknown. He indicated that the status of his maternal grandfather is unknown. He indicated that the status of his neg hx is unknown.   SOCIAL HISTORY: He  reports that he quit smoking about 18 years ago. He has never used smokeless tobacco. He reports that he drinks about 8.4 - 12.6 oz of alcohol per week. He reports that he does not use drugs.  REVIEW OF SYSTEMS:   Unable to assess as patient is intubated on mechanical ventilation  SUBJECTIVE:  Arrived from OR on fentanyl 75 mcg/hr and propofol at 75 mcg/kg/min, held on arrival to ICU   VITAL SIGNS: BP 98/63   Pulse 66   Resp 18   Wt 182 lb (82.6 kg)   SpO2 100%   BMI 25.03 kg/m   HEMODYNAMICS:  VENTILATOR SETTINGS: Vent Mode: PRVC FiO2 (%):  [100 %] 100 % Set Rate:  [18 bmp] 18 bmp Vt Set:  [600 mL] 600 mL PEEP:  [5 cmH20] 5 cmH20 Plateau Pressure:  [15 cmH20] 15 cmH20  INTAKE / OUTPUT: I/O last 3 completed shifts: In: 1025.1 [I.V.:1025.1] Out: -   PHYSICAL EXAMINATION: General:  Critically ill male on MV in NAD, sedation held HEENT: MM pink/moist, pupils 2/reactive, anicteric, ETT/ OGT, JP drain to R frontal  Neuro: Follows command on RUE and RLE, withdrawals on LLE, no movement noted in LUE CV: SR, rrr, no m/r/g PULM: even/non-labored, lungs  bilaterally clear  GI: soft, non-tender, bs active  Extremities: warm/dry, no edema  Skin: no rashes  LABS:  BMET Recent Labs  Lab 09/04/17 1510  NA 141  K 5.8*  CL 100  CO2 14*  BUN 26*  CREATININE 2.75*  GLUCOSE 73    Electrolytes Recent Labs  Lab 09/04/17 1510  CALCIUM 9.9    CBC Recent Labs  Lab 09/04/17 1510  WBC 15.9*  HGB 14.6  HCT 46.8  PLT 251    Coag's Recent Labs  Lab 09/04/17 1510  INR 1.04    Sepsis Markers Recent Labs  Lab 09/04/17 1536  LATICACIDVEN 12.66*    ABG No results for input(s): PHART, PCO2ART, PO2ART in the last 168 hours.  Liver Enzymes Recent Labs  Lab 09/04/17 1510  AST 134*  ALT 71*  ALKPHOS 76  BILITOT 2.0*  ALBUMIN 4.3    Cardiac Enzymes No results for input(s): TROPONINI, PROBNP in the last 168 hours.  Glucose Recent Labs  Lab 09/04/17 1517 09/04/17 1618  GLUCAP 65* 155*    Imaging Ct Head Wo Contrast  Result Date: 09/04/2017 CLINICAL DATA:  Initial evaluation for new onset seizure, left-sided weakness. EXAM: CT HEAD WITHOUT CONTRAST TECHNIQUE: Contiguous axial images were obtained from the base of the skull through the vertex without intravenous contrast. COMPARISON:  None. FINDINGS: Brain: Acute mixed density subdural hematoma overlying the right cerebral convexity measures up to 15 mm in thickness. Additional small acute subdural hemorrhage overlies the left frontal convexity, measuring up to 5 mm in maximal thickness. Extension along the falx and left tentorium, with blood seen at the foramen magnum. Associated mass effect with 5 mm of right-to-left shift. No hydrocephalus or ventricular trapping. Basilar cisterns remain patent. No acute large vessel territory infarct. No mass lesion. No other acute intracranial abnormality. Vascular: No asymmetric hyperdense vessel. Skull: Probable small soft tissue contusion at the left frontal scalp. Calvarium intact. Sinuses/Orbits: Globes and orbital soft tissues  demonstrate no acute finding. Note made of a 7 mm hypodensity at the posterior left orbit (series 3, image 15), indeterminate, possibly a small varix. Paranasal sinuses and mastoid air cells are largely clear. Other: None. IMPRESSION: 1. Acute mixed density right holo hemispheric subdural hematoma measuring up to 15 mm in maximal thickness. Associated mass effect with 5 mm right-to-left shift. No hydrocephalus or ventricular trapping. 2. Additional small left subdural hematoma measuring up to 5 mm without significant mass effect. 3. Small left frontal scalp contusion.  Calvarium intact. 4. 7 mm hyperdense lesion at the posterior left orbit, indeterminate, but may reflect a small venous varix. Finding of doubtful significance in the acute setting. Critical Value/emergent results were called by telephone at the time of interpretation on 09/04/2017 at 3:40 pm to Dr. Tomi Bamberger , who verbally acknowledged these results. Electronically Signed   By: Jeannine Boga M.D.   On: 09/04/2017 15:42  Dg Chest Port 1 View  Result Date: 09/04/2017 CLINICAL DATA:  Mild third mental status. EXAM: PORTABLE CHEST 1 VIEW COMPARISON:  None. FINDINGS: Endotracheal tube in place with the tip approximately 4.5 cm above the carina. Enteric tube in the stomach. The heart size and mediastinal contours are within normal limits. Normal pulmonary vascularity. No focal consolidation, pleural effusion, or pneumothorax. No acute osseous abnormality. IMPRESSION: 1. Appropriately positioned endotracheal and enteric tubes. 2. No active disease. Electronically Signed   By: Titus Dubin M.D.   On: 09/04/2017 15:47   STUDIES:  7/2 CTH >> 1. Acute mixed density right holo hemispheric subdural hematoma measuring up to 15 mm in maximal thickness. Associated mass effect with 5 mm right-to-left shift. No hydrocephalus or ventricular trapping. 2. Additional small left subdural hematoma measuring up to 5 mm without significant mass effect. 3.  Small left frontal scalp contusion.  Calvarium intact. 4. 7 mm hyperdense lesion at the posterior left orbit, indeterminate, but may reflect a small venous varix. Finding of doubtful significance in the acute setting.   CULTURES: 7/2 UC >> 7/2 MRSA PCR >>  ANTIBIOTICS: 7/2 cefazolin (pre-op)  SIGNIFICANT EVENTS: 7/2 Admit/ OR   LINES/TUBES: PIV x 2 7/2 ETT >> 7/2 OGT >> 7/2 foley >> 7/2 R radial Aline >>  DISCUSSION: 46 yoM with PMH of A.Flutter s/p ablation and off AC in 03/2017 with fall several weeks ago.  Found actively seizing by family this morning, with several subsequent witnessed seizures.  CTH showed large right SDH taken emergently to OR for crani.  Returns to ICU on MV.   ASSESSMENT / PLAN:  PULMONARY A: Acute respiratory insufficiency in the post-operative settting Hx of OSA on CPAP - CXR reviewed with no abnormality, ETT in satisfactory position  P:   Full MV support, PRVC 8 cc/kg, rate 14 ABG now CXR in am  VAP protocol  Pepcid daily for SUP  CARDIOVASCULAR A:  Hx Aflutter s/p ablation, HLD - EKG non acute, maintains SR  P:  Tele monitoring SBP goal < 140 Labetalol prn  Continue cardizem, metoprolol, and zocor Trend lactate   RENAL A:   AKI (baseline sCr 0.9-1) Hyperkalemia - mild Lactic acidosis P:   Repeat renal panel, mag, lactic, CK, myoglobin, and ABG now NS at 148m/hr  Trend BMP/ mag/ phos/ daily wt / urinary output Replace electrolytes as indicated  GASTROINTESTINAL A:   Transaminitis and hyperbilirubinemia - mild  ETOH use  P:   NPO Repeat LFT in am  Thiamine, folic acid and MVI daily Bowel regimen per PAD protocol   HEMATOLOGIC A:   Leukocytosis  - normal coags  P:  Trend CBC SCDs only   INFECTIOUS A:   Leukocytosis  - likely reactive, no obvious source, CXR neg P:   UA pending Assess PCT  Trend WBC/ fever curve/. Fever prevention Tylenol prn   ENDOCRINE A:   No acute issues P:   CBG q 4  Add SSI if  needed   NEUROLOGIC A:   Large R SDH with mass effect and early shift Seizures related to SDH P:   Neurosurgery primary  RASS goal: 0/-1 PAD protocol with fentanyl prn and propofol DWA Continue Keppra  Seizure precautions Further imaging per NSGY  FAMILY  - Updates: Family updated at bedside by Dr. Carson Myrtle   - Inter-disciplinary family meet or Palliative Care meeting due by:  7/9  CCT 45 mins   Kennieth Rad, AGACNP-BC [facility] Pgr: [phone] or  if no answer [phone] 09/04/2017, 8:45 PM

## 2017-09-04 NOTE — Anesthesia Preprocedure Evaluation (Addendum)
Anesthesia Evaluation  Patient identified by MRN, date of birth, ID band Patient unresponsive  General Assessment Comment:Pt intubated in ED  Reviewed: Allergy & Precautions, Patient's Chart, lab work & pertinent test results, Unable to perform ROS - Chart review onlyPreop documentation limited or incomplete due to emergent nature of procedure.  Airway Mallampati: Intubated       Dental   Pulmonary sleep apnea and Continuous Positive Airway Pressure Ventilation , former smoker,    breath sounds clear to auscultation       Cardiovascular hypertension, Pt. on medications and Pt. on home beta blockers + dysrhythmias (s/p ablation 1/19) Atrial Fibrillation  Rate:Normal  '15 ECHO:  EF 65%, valves OK   Neuro/Psych Seizures -,  Pt with h/o afib, fell 3 weeks ago, found unresponsive and seizing. Pt with SDH and intubated in the ED    GI/Hepatic   Endo/Other    Renal/GU Renal InsufficiencyRenal disease (creat 2.75)     Musculoskeletal   Abdominal   Peds  Hematology   Anesthesia Other Findings   Reproductive/Obstetrics                           Anesthesia Physical Anesthesia Plan  ASA: IV and emergent  Anesthesia Plan: General   Post-op Pain Management:    Induction: Inhalational  PONV Risk Score and Plan: 2 and Treatment may vary due to age or medical condition  Airway Management Planned: Oral ETT  Additional Equipment: Arterial line  Intra-op Plan:   Post-operative Plan: Post-operative intubation/ventilation  Informed Consent:   Only emergency history available and History available from chart only  Plan Discussed with: CRNA and Surgeon  Anesthesia Plan Comments: (Plan routine monitors, A-line, GETA with existing ETT and post op ventilation)        Anesthesia Quick Evaluation

## 2017-09-04 NOTE — ED Notes (Signed)
Patient transported to CT 

## 2017-09-04 NOTE — Anesthesia Procedure Notes (Signed)
Arterial Line Insertion Start/End7/04/2017 6:00 PM, 09/04/2017 6:15 PM Performed by: Myna Bright, CRNA, CRNA  Patient location: OR. Preanesthetic checklist: patient identified and IV checked Emergency situation Patient sedated Left, radial was placed Catheter size: 20 G Hand hygiene performed  and maximum sterile barriers used  Allen's test indicative of satisfactory collateral circulation Attempts: 2 Procedure performed without using ultrasound guided technique. Following insertion, dressing applied and Biopatch. Post procedure assessment: normal  Patient tolerated the procedure well with no immediate complications.

## 2017-09-04 NOTE — H&P (Signed)
Reason for Consult:Subdural hematoma Referring Physician: Cobi Delph is an 62 y.o. male.  HPI: Reported the patient is a generally healthy male, though with a history of atrial fibrillation, who reportedly had a fall 3 weeks ago. The history was provided by a friend who found the patient on the ground actively seizing just prior to calling EMS. From this morning EMS notes that patient had several other witnessed seizures, received a total of 5 mg of Versed and 2 separate 2.5 mg boluses. Patient's last seizure activity was just before arrival to the emergency department. The patient himself slowly follows some commands, by lifting his right arm to questioning, otherwise does not move his extremities, does not interact substantially.    Past Medical History:  Diagnosis Date  . Atrial flutter ([place])   . Dyslipidemia   . OSA on CPAP 01/20/2014    Past Surgical History:  Procedure Laterality Date  . A-FLUTTER ABLATION N/A 03/22/2017   Procedure: A-FLUTTER ABLATION;  Surgeon: Thompson Grayer, MD;  Location: [facility];  Service: Cardiovascular;  Laterality: N/A;  . NO PAST SURGERIES      Family History  Problem Relation Age of Onset  . Diabetes Father   . Stroke Father   . Hypertension Mother        ?  [name] Heart attack Maternal Grandfather   . Colon cancer Neg Hx     Social History:  reports that he quit smoking about 18 years ago. He has never used smokeless tobacco. He reports that he drinks about 8.4 - 12.6 oz of alcohol per week. He reports that he does not use drugs.  Allergies: No Known Allergies  Medications: I have reviewed the patient's current medications.  Results for orders placed or performed during the hospital encounter of 09/04/17 (from the past 48 hour(s))  Comprehensive metabolic panel     Status: Abnormal   Collection Time: 09/04/17  3:10 PM  Result Value Ref Range   Sodium 141 135 - 145 mmol/L   Potassium 5.8 (H) 3.5 - 5.1 mmol/L   Chloride 100 98 - 111 mmol/L    Comment: Please note change in reference range.   CO2 14 (L) 22 - 32 mmol/L   Glucose, Bld 73 70 - 99 mg/dL    Comment: Please note change in reference range.   BUN 26 (H) 8 - 23 mg/dL    Comment: Please note change in reference range.   Creatinine, Ser 2.75 (H) 0.61 - 1.24 mg/dL   Calcium 9.9 8.9 - 10.3 mg/dL   Total Protein 9.0 (H) 6.5 - 8.1 g/dL   Albumin 4.3 3.5 - 5.0 g/dL   AST 134 (H) 15 - 41 U/L   ALT 71 (H) 0 - 44 U/L    Comment: Please note change in reference range.   Alkaline Phosphatase 76 38 - 126 U/L   Total Bilirubin 2.0 (H) 0.3 - 1.2 mg/dL   GFR calc non Af Amer 23 (L) >60 mL/min   GFR calc Af Amer 26 (L) >60 mL/min    Comment: (NOTE) The eGFR has been calculated using the CKD EPI equation. This calculation has not been validated in all clinical situations. eGFR's persistently <60 mL/min signify possible Chronic Kidney Disease.    Anion gap 27 (H) 5 - 15    Comment: Performed at [facility], [place] [address] [place], [place] [address]  CBC WITH DIFFERENTIAL     Status: Abnormal   Collection Time:  09/04/17  3:10 PM  Result Value Ref Range   WBC 15.9 (H) 4.0 - 10.5 K/uL   RBC 4.32 4.22 - 5.81 MIL/uL   Hemoglobin 14.6 13.0 - 17.0 g/dL   HCT 46.8 39.0 - 52.0 %   MCV 108.3 (H) 78.0 - 100.0 fL   MCH 33.8 26.0 - 34.0 pg   MCHC 31.2 30.0 - 36.0 g/dL   RDW 13.2 11.5 - 15.5 %   Platelets 251 150 - 400 K/uL   Neutrophils Relative % 81 %   Neutro Abs 12.8 (H) 1.7 - 7.7 K/uL   Lymphocytes Relative 7 %   Lymphs Abs 1.2 0.7 - 4.0 K/uL   Monocytes Relative 11 %   Monocytes Absolute 1.8 (H) 0.1 - 1.0 K/uL   Eosinophils Relative 0 %   Eosinophils Absolute 0.0 0.0 - 0.7 K/uL   Basophils Relative 0 %   Basophils Absolute 0.1 0.0 - 0.1 K/uL   Immature Granulocytes 1 %   Abs Immature Granulocytes 0.1 0.0 - 0.1 K/uL    Comment: Performed at [facility], [address] [place], [place] [address]  Protime-INR     Status: None    Collection Time: 09/04/17  3:10 PM  Result Value Ref Range   Prothrombin Time 13.5 11.4 - 15.2 seconds   INR 1.04     Comment: Performed at [facility] [address], [place], [place] [address]  CBG monitoring, ED     Status: Abnormal   Collection Time: 09/04/17  3:17 PM  Result Value Ref Range   Glucose-Capillary 65 (L) 70 - 99 mg/dL  I-Stat CG4 Lactic Acid, ED  (not at [facility])     Status: Abnormal   Collection Time: 09/04/17  3:36 PM  Result Value Ref Range   Lactic Acid, Venous 12.66 (HH) 0.5 - 1.9 mmol/L   Comment NOTIFIED PHYSICIAN   CBG monitoring, ED     Status: Abnormal   Collection Time: 09/04/17  4:18 PM  Result Value Ref Range   Glucose-Capillary 155 (H) 70 - 99 mg/dL    Ct Head Wo Contrast  Result Date: 09/04/2017 CLINICAL DATA:  Initial evaluation for new onset seizure, left-sided weakness. EXAM: CT HEAD WITHOUT CONTRAST TECHNIQUE: Contiguous axial images were obtained from the base of the skull through the vertex without intravenous contrast. COMPARISON:  None. FINDINGS: Brain: Acute mixed density subdural hematoma overlying the right cerebral convexity measures up to 15 mm in thickness. Additional small acute subdural hemorrhage overlies the left frontal convexity, measuring up to 5 mm in maximal thickness. Extension along the falx and left tentorium, with blood seen at the foramen magnum. Associated mass effect with 5 mm of right-to-left shift. No hydrocephalus or ventricular trapping. Basilar cisterns remain patent. No acute large vessel territory infarct. No mass lesion. No other acute intracranial abnormality. Vascular: No asymmetric hyperdense vessel. Skull: Probable small soft tissue contusion at the left frontal scalp. Calvarium intact. Sinuses/Orbits: Globes and orbital soft tissues demonstrate no acute finding. Note made of a 7 mm hypodensity at the posterior left orbit (series 3, image 15), indeterminate, possibly a small varix. Paranasal sinuses and mastoid air  cells are largely clear. Other: None. IMPRESSION: 1. Acute mixed density right holo hemispheric subdural hematoma measuring up to 15 mm in maximal thickness. Associated mass effect with 5 mm right-to-left shift. No hydrocephalus or ventricular trapping. 2. Additional small left subdural hematoma measuring up to 5 mm without significant mass effect. 3. Small left frontal scalp  contusion.  Calvarium intact. 4. 7 mm hyperdense lesion at the posterior left orbit, indeterminate, but may reflect a small venous varix. Finding of doubtful significance in the acute setting. Critical Value/emergent results were called by telephone at the time of interpretation on 09/04/2017 at 3:40 pm to Dr. Tomi Bamberger , who verbally acknowledged these results. Electronically Signed   By: Jeannine Boga M.D.   On: 09/04/2017 15:42   Dg Chest Port 1 View  Result Date: 09/04/2017 CLINICAL DATA:  Mild third mental status. EXAM: PORTABLE CHEST 1 VIEW COMPARISON:  None. FINDINGS: Endotracheal tube in place with the tip approximately 4.5 cm above the carina. Enteric tube in the stomach. The heart size and mediastinal contours are within normal limits. Normal pulmonary vascularity. No focal consolidation, pleural effusion, or pneumothorax. No acute osseous abnormality. IMPRESSION: 1. Appropriately positioned endotracheal and enteric tubes. 2. No active disease. Electronically Signed   By: Titus Dubin M.D.   On: 09/04/2017 15:47    Review of Systems - Negative except As per HPI    Blood pressure 98/63, pulse 66, resp. rate 18, weight 82.6 kg (182 lb), SpO2 100 %. Physical Exam  Constitutional: He appears well-developed and well-nourished.  HENT:  Head: Normocephalic and atraumatic.  Eyes: Pupils are equal, round, and reactive to light.  Neurological:  Patient is intubated and sedated.  No exam available.    Assessment/Plan: Patient has large right subdural hematoma with new onset seizures.  Found down.  Taken emergently to OR  for right craniotomy for subdural.    Peggyann Shoals, MD 09/04/2017, 6:09 PM

## 2017-09-04 NOTE — Anesthesia Postprocedure Evaluation (Signed)
Anesthesia Post Note  Patient: Steven Byrd  Procedure(s) Performed: CRANIOTOMY FOR SUBDURAL HEMATOMA (Right Head)     Patient location during evaluation: SICU Anesthesia Type: General Level of consciousness: sedated Pain management: pain level controlled Vital Signs Assessment: post-procedure vital signs reviewed and stable Respiratory status: patient remains intubated per anesthesia plan Cardiovascular status: stable Postop Assessment: no apparent nausea or vomiting Anesthetic complications: no    Last Vitals:  Vitals:   09/04/17 1730 09/04/17 1955  BP: 98/63 113/62  Pulse: 66   Resp: 18 16  SpO2: 100%     Last Pain: There were no vitals filed for this visit.               Jourdain Guay,W. EDMOND

## 2017-09-04 NOTE — Transfer of Care (Signed)
Immediate Anesthesia Transfer of Care Note  Patient: Steven Byrd  Procedure(s) Performed: CRANIOTOMY FOR SUBDURAL HEMATOMA (Right Head)  Patient Location: ICU  Anesthesia Type:General  Level of Consciousness: sedated and Patient remains intubated per anesthesia plan  Airway & Oxygen Therapy: Patient remains intubated per anesthesia plan and Patient placed on Ventilator (see vital sign flow sheet for setting)  Post-op Assessment: Report given to RN and Post -op Vital signs reviewed and stable  Post vital signs: Reviewed and stable  Last Vitals:  Vitals Value Taken Time  BP 113/62 09/04/2017  7:53 PM  Temp    Pulse    Resp 16 09/04/2017  7:59 PM  SpO2    Vitals shown include unvalidated device data.  Last Pain: There were no vitals filed for this visit.       Complications: No apparent anesthesia complications

## 2017-09-04 NOTE — ED Notes (Signed)
Dr. Vanita Panda aware of I-stat lactic acid

## 2017-09-05 ENCOUNTER — Inpatient Hospital Stay (HOSPITAL_COMMUNITY): Payer: BLUE CROSS/BLUE SHIELD

## 2017-09-05 ENCOUNTER — Encounter (HOSPITAL_COMMUNITY): Payer: Self-pay | Admitting: Neurosurgery

## 2017-09-05 DIAGNOSIS — R8271 Bacteriuria: Secondary | ICD-10-CM | POA: Diagnosis present

## 2017-09-05 DIAGNOSIS — R8281 Pyuria: Secondary | ICD-10-CM

## 2017-09-05 LAB — BLOOD GAS, ARTERIAL
Acid-base deficit: 6.1 mmol/L — ABNORMAL HIGH (ref 0.0–2.0)
BICARBONATE: 18.4 mmol/L — AB (ref 20.0–28.0)
Drawn by: 347621
FIO2: 40
MECHVT: 640 mL
O2 Saturation: 98.3 %
PEEP: 5 cmH2O
PO2 ART: 124 mmHg — AB (ref 83.0–108.0)
Patient temperature: 98
RATE: 17 resp/min
pCO2 arterial: 33.5 mmHg (ref 32.0–48.0)
pH, Arterial: 7.357 (ref 7.350–7.450)

## 2017-09-05 LAB — BASIC METABOLIC PANEL
ANION GAP: 9 (ref 5–15)
BUN: 40 mg/dL — ABNORMAL HIGH (ref 8–23)
CHLORIDE: 112 mmol/L — AB (ref 98–111)
CO2: 18 mmol/L — AB (ref 22–32)
CREATININE: 2.38 mg/dL — AB (ref 0.61–1.24)
Calcium: 7.2 mg/dL — ABNORMAL LOW (ref 8.9–10.3)
GFR calc non Af Amer: 27 mL/min — ABNORMAL LOW (ref >60)
GFR, EST AFRICAN AMERICAN: 31 mL/min — AB (ref >60)
Glucose, Bld: 132 mg/dL — ABNORMAL HIGH (ref 70–99)
Potassium: 4.6 mmol/L (ref 3.5–5.1)
Sodium: 139 mmol/L (ref 135–145)

## 2017-09-05 LAB — EXPECTORATED SPUTUM ASSESSMENT W REFEX TO RESP CULTURE: SPECIAL REQUESTS: NORMAL

## 2017-09-05 LAB — GLUCOSE, CAPILLARY
GLUCOSE-CAPILLARY: 116 mg/dL — AB (ref 70–99)
GLUCOSE-CAPILLARY: 115 mg/dL — AB (ref 70–99)
GLUCOSE-CAPILLARY: 146 mg/dL — AB (ref 70–99)
Glucose-Capillary: 122 mg/dL — ABNORMAL HIGH (ref 70–99)
Glucose-Capillary: 145 mg/dL — ABNORMAL HIGH (ref 70–99)

## 2017-09-05 LAB — CK
CK TOTAL: 20131 U/L — AB (ref 49–397)
Total CK: 17100 U/L — ABNORMAL HIGH (ref 49–397)
Total CK: 18721 U/L — ABNORMAL HIGH (ref 49–397)

## 2017-09-05 LAB — CBC
HEMATOCRIT: 32.3 % — AB (ref 39.0–52.0)
HEMOGLOBIN: 10.5 g/dL — AB (ref 13.0–17.0)
MCH: 33.8 pg (ref 26.0–34.0)
MCHC: 32.5 g/dL (ref 30.0–36.0)
MCV: 103.9 fL — AB (ref 78.0–100.0)
Platelets: 148 10*3/uL — ABNORMAL LOW (ref 150–400)
RBC: 3.11 MIL/uL — AB (ref 4.22–5.81)
RDW: 13.1 % (ref 11.5–15.5)
WBC: 8.6 10*3/uL (ref 4.0–10.5)

## 2017-09-05 LAB — LACTIC ACID, PLASMA: Lactic Acid, Venous: 1 mmol/L (ref 0.5–1.9)

## 2017-09-05 LAB — EXPECTORATED SPUTUM ASSESSMENT W GRAM STAIN, RFLX TO RESP C

## 2017-09-05 MED ORDER — POLYVINYL ALCOHOL 1.4 % OP SOLN
1.0000 [drp] | OPHTHALMIC | Status: DC | PRN
  Administered 2017-09-05: 1 [drp] via OPHTHALMIC
  Filled 2017-09-05: qty 15

## 2017-09-05 MED ORDER — LACTATED RINGERS IV SOLN
INTRAVENOUS | Status: DC
  Administered 2017-09-05 – 2017-09-09 (×6): via INTRAVENOUS

## 2017-09-05 MED ORDER — SODIUM CHLORIDE 0.9 % IV BOLUS
1000.0000 mL | Freq: Once | INTRAVENOUS | Status: AC
  Administered 2017-09-05: 1000 mL via INTRAVENOUS

## 2017-09-05 MED ORDER — PIPERACILLIN-TAZOBACTAM 3.375 G IVPB 30 MIN
3.3750 g | Freq: Once | INTRAVENOUS | Status: AC
  Administered 2017-09-05: 3.375 g via INTRAVENOUS
  Filled 2017-09-05: qty 50

## 2017-09-05 MED ORDER — CEFTRIAXONE SODIUM 1 G IJ SOLR
1.0000 g | INTRAMUSCULAR | Status: DC
  Filled 2017-09-05: qty 10

## 2017-09-05 MED ORDER — PIPERACILLIN-TAZOBACTAM 3.375 G IVPB
3.3750 g | Freq: Three times a day (TID) | INTRAVENOUS | Status: DC
  Administered 2017-09-05 – 2017-09-08 (×9): 3.375 g via INTRAVENOUS
  Filled 2017-09-05 (×10): qty 50

## 2017-09-05 NOTE — Progress Notes (Signed)
eLink Physician-Brief Progress Note Patient Name: Steven Byrd DOB: [birth year] MRN: [id number]   Date of Service  09/05/2017  HPI/Events of Note  Hypotension - BP = 84/64 with MAP = 71. LVEF = 60-65%.   eICU Interventions  Will order: 1.  Bolus with 0.9 NaCl IV over 1 hour now.      Intervention Category Major Interventions: Hypotension - evaluation and management  [name],Steven Eugene 09/05/2017, 3:19 AM

## 2017-09-05 NOTE — Progress Notes (Signed)
Pharmacy Antibiotic Note  Steven Byrd is a 62 y.o. male admitted on 09/04/2017 with SDH, now w/ hypotension, concern for sepsis.  Pharmacy has been consulted for Zosyn dosing.  Plan: Zosyn 3.375g IV q8h (4 hour infusion).  Height: 6\' 1"  (185.4 cm) Weight: 182 lb (82.6 kg) IBW/kg (Calculated) : 79.9  Temp (24hrs), Avg:97.9 F (36.6 C), Min:97.8 F (36.6 C), Max:98 F (36.7 C)  Recent Labs  Lab 09/04/17 1510 09/04/17 1536 09/04/17 2012 09/04/17 2013 09/04/17 2020  WBC 15.9*  --   --   --   --   CREATININE 2.75*  --   --  2.68* 2.71*  LATICACIDVEN  --  12.66* 0.9  --   --     Estimated Creatinine Clearance: 31.1 mL/min (A) (by C-G formula based on SCr of 2.71 mg/dL (H)).    No Known Allergies   Thank you for allowing pharmacy to be a part of this patient's care.  Wynona Neat, PharmD, BCPS  09/05/2017 4:02 AM

## 2017-09-05 NOTE — Procedures (Signed)
Extubation Procedure Note  Patient Details:   Name: Steven Byrd DOB: 18-Jan-1956 MRN: [id number]   Airway Documentation:    Vent end date: 09/05/17 Vent end time: 0840   Evaluation  O2 sats: stable throughout Complications: No apparent complications Patient did tolerate procedure well. Bilateral Breath Sounds: Clear   Yes  [name] P 09/05/2017, 8:53 AM

## 2017-09-05 NOTE — Progress Notes (Signed)
PULMONARY / CRITICAL CARE MEDICINE   Name: Steven Byrd MRN: [id number] DOB: [birth year]    ADMISSION DATE:  09/04/2017 CONSULTATION DATE: 09/04/2017  REFERRING MD:  Vickie Epley  CHIEF COMPLAINT:  S/P SDH evacuation  HISTORY OF PRESENT ILLNESS:       62 year old male with PMH significant for atrial flutter s/p ablation 03/22/2017 and since of anticoagulation and ASA, HLD, and OSA on CPAP who presented with seizures.  [place].  Was found actively seizing on ground this morning by family followed by several witnessed seizures.  No prior history of seizures.  Was able to slowly follow some commands mostly with his right upper arm in ER.  Found to on CT head to have large right sided subdural hematoma and therefore taken emergently to OR for craniotomy.      His morning he is very alert and appropriate and does not complain of dyspnea on CPAP with pressure support of 5.           PAST MEDICAL HISTORY :  He  has a past medical history of Atrial flutter ([place]), Dyslipidemia, and OSA on CPAP (01/20/2014).  PAST SURGICAL HISTORY: He  has a past surgical history that includes No past surgeries and A-FLUTTER ABLATION (N/A, 03/22/2017).  No Known Allergies  No current facility-administered medications on file prior to encounter.    Current Outpatient Medications on File Prior to Encounter  Medication Sig  . MATZIM LA 240 MG 24 hr tablet TAKE 1 TABLET BY MOUTH EVERY DAY  . metoprolol tartrate (LOPRESSOR) 25 MG tablet Take 25 mg by mouth 2 (two) times daily.  . simvastatin (ZOCOR) 40 MG tablet TAKE 1 TABLET BY MOUTH AT BEDTIME  . acyclovir (ZOVIRAX) 800 MG tablet Take 1 tablet (800 mg total) by mouth 3 (three) times daily as needed. (Patient not taking: Reported on 09/04/2017)  . CALCIUM-MAGNESIUM-ZINC PO Take 1 capsule by mouth daily.  . cholecalciferol (VITAMIN D) 1000 units tablet Take 1,000 Units by mouth  daily.  . Coenzyme Q10 (CO Q 10 PO) Take 1 capsule by mouth daily.  . finasteride (PROSCAR) 5 MG tablet TAKE 1/4 OF A TABLET DAILY (Patient not taking: Reported on 09/04/2017)  . HYDROcodone-acetaminophen (NORCO/VICODIN) 5-325 MG tablet Take 1-2 tablets by mouth every 4 (four) hours as needed for moderate pain. (Patient not taking: Reported on 09/04/2017)  . latanoprost (XALATAN) 0.005 % ophthalmic solution Place 1 drop into both eyes every evening.  . Multiple Vitamin (MULTIVITAMIN) capsule Take 1 capsule by mouth daily.  . TURMERIC PO Take 1 tablet by mouth daily.    FAMILY HISTORY:  His indicated that the status of his mother is unknown. He indicated that the status of his father is unknown. He indicated that the status of his maternal grandfather is unknown. He indicated that the status of his neg hx is unknown.   SOCIAL HISTORY: He  reports that he quit smoking about 18 years ago. He has never used smokeless tobacco. He reports that he drinks about 8.4 - 12.6 oz of alcohol per week. He reports that he does not use drugs.  REVIEW OF SYSTEMS:   Not obtainable at present SUBJECTIVE:  As above  VITAL SIGNS: BP 98/67   Pulse 65   Temp 98 F (36.7 C) (Axillary)   Resp 12   Ht 6\' 1"  (1.854 m)   Wt 182 lb (82.6 kg)   SpO2 98%  BMI 24.01 kg/m   HEMODYNAMICS:    VENTILATOR SETTINGS: Vent Mode: CPAP;PSV FiO2 (%):  [40 %-100 %] 40 % Set Rate:  [14 bmp-18 bmp] 17 bmp Vt Set:  [600 mL-640 mL] 640 mL PEEP:  [5 cmH20] 5 cmH20 Pressure Support:  [5 cmH20] 5 cmH20 Plateau Pressure:  [15 cmH20-17 cmH20] 17 cmH20  INTAKE / OUTPUT: I/O last 3 completed shifts: In: 3040.6 [I.V.:2365.6; IV Piggyback:675] Out: 82 [Urine:650; Drains:140; Blood:100]  PHYSICAL EXAMINATION: General: Middle-aged male in no distress orally intubated and mechanically ventilated Neuro: Nodding appropriately to questions and following instructions.  Pupils are equal EOMs appear to be full seems to be somewhat  weaker on the left than on the right. HEENT: Incision is dry Cardiovascular: S1 and S2 are currently regular without murmur rub or gallop Lungs: Respirations are unlabored on a pressure support of 5, there is symmetric air movement, no wheezes, no rhonchi Abdomen: The abdomen is soft without any organomegaly masses tenderness guarding or rebound.  There are bowel sounds present Musculoskeletal: There is no dependent edema the limbs are warm   LABS:  BMET Recent Labs  Lab 09/04/17 2013 09/04/17 2020 09/05/17 0405  NA 139 139 139  K 5.0 5.0 4.6  CL 109 108 112*  CO2 17* 18* 18*  BUN 34* 34* 40*  CREATININE 2.68* 2.71* 2.38*  GLUCOSE 130* 127* 132*    Electrolytes Recent Labs  Lab 09/04/17 2013 09/04/17 2020 09/05/17 0405  CALCIUM 7.6* 7.7* 7.2*  MG 2.3  --   --   PHOS  --  5.7*  --     CBC Recent Labs  Lab 09/04/17 1510 09/05/17 0405  WBC 15.9* 8.6  HGB 14.6 10.5*  HCT 46.8 32.3*  PLT 251 148*    Coag's Recent Labs  Lab 09/04/17 1510  INR 1.04    Sepsis Markers Recent Labs  Lab 09/04/17 1536 09/04/17 2012 09/04/17 2013 09/05/17 0405  LATICACIDVEN 12.66* 0.9  --  1.0  PROCALCITON  --   --  35.75  --     ABG Recent Labs  Lab 09/04/17 2045 09/05/17 0540  PHART 7.302* 7.357  PCO2ART 43.1 33.5  PO2ART 397.0* 124*    Liver Enzymes Recent Labs  Lab 09/04/17 1510 09/04/17 2020  AST 134*  --   ALT 71*  --   ALKPHOS 76  --   BILITOT 2.0*  --   ALBUMIN 4.3 3.5    Cardiac Enzymes No results for input(s): TROPONINI, PROBNP in the last 168 hours.  Glucose Recent Labs  Lab 09/04/17 1517 09/04/17 1618 09/04/17 2332 09/05/17 0346 09/05/17 0743  GLUCAP 65* 155* 108* 122* 116*    Imaging Ct Head Wo Contrast  Result Date: 09/04/2017 CLINICAL DATA:  Initial evaluation for new onset seizure, left-sided weakness. EXAM: CT HEAD WITHOUT CONTRAST TECHNIQUE: Contiguous axial images were obtained from the base of the skull through the vertex  without intravenous contrast. COMPARISON:  None. FINDINGS: Brain: Acute mixed density subdural hematoma overlying the right cerebral convexity measures up to 15 mm in thickness. Additional small acute subdural hemorrhage overlies the left frontal convexity, measuring up to 5 mm in maximal thickness. Extension along the falx and left tentorium, with blood seen at the foramen magnum. Associated mass effect with 5 mm of right-to-left shift. No hydrocephalus or ventricular trapping. Basilar cisterns remain patent. No acute large vessel territory infarct. No mass lesion. No other acute intracranial abnormality. Vascular: No asymmetric hyperdense vessel. Skull: Probable small soft tissue contusion at  the left frontal scalp. Calvarium intact. Sinuses/Orbits: Globes and orbital soft tissues demonstrate no acute finding. Note made of a 7 mm hypodensity at the posterior left orbit (series 3, image 15), indeterminate, possibly a small varix. Paranasal sinuses and mastoid air cells are largely clear. Other: None. IMPRESSION: 1. Acute mixed density right holo hemispheric subdural hematoma measuring up to 15 mm in maximal thickness. Associated mass effect with 5 mm right-to-left shift. No hydrocephalus or ventricular trapping. 2. Additional small left subdural hematoma measuring up to 5 mm without significant mass effect. 3. Small left frontal scalp contusion.  Calvarium intact. 4. 7 mm hyperdense lesion at the posterior left orbit, indeterminate, but may reflect a small venous varix. Finding of doubtful significance in the acute setting. Critical Value/emergent results were called by telephone at the time of interpretation on 09/04/2017 at 3:40 pm to Dr. Tomi Bamberger , who verbally acknowledged these results. Electronically Signed   By: Jeannine Boga M.D.   On: 09/04/2017 15:42   Dg Chest Port 1 View  Result Date: 09/04/2017 CLINICAL DATA:  Mild third mental status. EXAM: PORTABLE CHEST 1 VIEW COMPARISON:  None. FINDINGS:  Endotracheal tube in place with the tip approximately 4.5 cm above the carina. Enteric tube in the stomach. The heart size and mediastinal contours are within normal limits. Normal pulmonary vascularity. No focal consolidation, pleural effusion, or pneumothorax. No acute osseous abnormality. IMPRESSION: 1. Appropriately positioned endotracheal and enteric tubes. 2. No active disease. Electronically Signed   By: Titus Dubin M.D.   On: 09/04/2017 15:47     STUDIES:    CULTURES: Urine culture is pending, procalcitonin is elevated to 31  ANTIBIOTICS: Zosyn   SIGNIFICANT EVENTS: Craniotomy on 7/2  LINES/TUBES:   DISCUSSION:      This is a 62 year old status post craniotomy for right subdural hematoma.  Current issues include likely UTI with a remarkably elevated procalcitonin history of OSA, history of seizures, and a history of intermittent atrial flutter.  ASSESSMENT / PLAN:  PULMONARY A: Mechanics look good gas exchange is good and he is very alert I am anticipating extubation in the next several minutes.  He does have OSA and I will discuss with the patient whether or not he uses a HS BiPAP   CARDIOVASCULAR A: Rhythm without any acute cardiovascular issues  RENAL A: CRI with a hyper chloremia acidosis.  I have switched his IV fluid to LR to address the latter   GASTROINTESTINAL A: Prophylaxis is with Pepcid   HEMATOLOGIC A: SCDs alone for prophylaxis due to CNS bleeding  INFECTIOUS A: Probable UTI, I will not switch from Zosyn until culture results are available  ENDOCRINE A: No known issues     NEUROLOGIC A: That is post evacuation of right subdural hematoma with likely some persistent left-sided weakness.  Continues Keppra for seizures     Lars Masson, MD [place] Pager: [phone]  09/05/2017, 8:30 AM

## 2017-09-05 NOTE — Care Management Note (Signed)
Case Management Note  Patient Details  Name: Steven Byrd MRN: [id number] Date of Birth: [birth year]  Subjective/Objective:   Pt admitted on 09/04/17 s/p seizure activity; found to have large Rt sided SDH and was taken emergently to OR for craniotomy.  PTA, pt independent, lives with spouse.                 Action/Plan: Pt extubated today; recommend PT/OT evaluations when pt able to tolerate therapies.  Will follow for discharge planning as pt progresses.  Expected Discharge Date:                  Expected Discharge Plan:     In-House Referral:     Discharge planning Services  CM Consult  Post Acute Care Choice:    Choice offered to:     DME Arranged:    DME Agency:     HH Arranged:    HH Agency:     Status of Service:  In process, will continue to follow  If discussed at Long Length of Stay Meetings, dates discussed:    Additional Comments:  Reinaldo Raddle, RN, BSN  Trauma/Neuro ICU Case Manager [phone]

## 2017-09-05 NOTE — Progress Notes (Signed)
Subjective: Patient reports nods to questions and follows commands  Objective: Vital signs in last 24 hours: Temp:  [97.8 F (36.6 C)-98 F (36.7 C)] 98 F (36.7 C) (07/03 0344) Pulse Rate:  [51-109] 57 (07/03 0700) Resp:  [15-25] 17 (07/03 0700) BP: (83-113)/(58-86) 83/62 (07/03 0700) SpO2:  [95 %-100 %] 99 % (07/03 0700) Arterial Line BP: (79-116)/(47-96) 95/50 (07/03 0700) FiO2 (%):  [40 %-100 %] 40 % (07/02 2325) Weight:  [82.6 kg (182 lb)] 82.6 kg (182 lb) (07/02 1527)  Intake/Output from previous day: 07/02 0701 - 07/03 0700 In: 3040.6 [I.V.:2365.6; IV Piggyback:675] Out: 890 [Urine:650; Drains:140; Blood:100] Intake/Output this shift: No intake/output data recorded.  Physical Exam: Dressing CDI.  Left hemiparesis, but moves all four extremities to command, left leg is weaker than arm.  Nods to questions and attends, despite high dose propofol.  In wrist restraints for agitation.   Lab Results: Recent Labs    09/04/17 1510 09/05/17 0405  WBC 15.9* 8.6  HGB 14.6 10.5*  HCT 46.8 32.3*  PLT 251 148*   BMET Recent Labs    09/04/17 2020 09/05/17 0405  NA 139 139  K 5.0 4.6  CL 108 112*  CO2 18* 18*  GLUCOSE 127* 132*  BUN 34* 40*  CREATININE 2.71* 2.38*  CALCIUM 7.7* 7.2*    Studies/Results: Ct Head Wo Contrast  Result Date: 09/04/2017 CLINICAL DATA:  Initial evaluation for new onset seizure, left-sided weakness. EXAM: CT HEAD WITHOUT CONTRAST TECHNIQUE: Contiguous axial images were obtained from the base of the skull through the vertex without intravenous contrast. COMPARISON:  None. FINDINGS: Brain: Acute mixed density subdural hematoma overlying the right cerebral convexity measures up to 15 mm in thickness. Additional small acute subdural hemorrhage overlies the left frontal convexity, measuring up to 5 mm in maximal thickness. Extension along the falx and left tentorium, with blood seen at the foramen magnum. Associated mass effect with 5 mm of  right-to-left shift. No hydrocephalus or ventricular trapping. Basilar cisterns remain patent. No acute large vessel territory infarct. No mass lesion. No other acute intracranial abnormality. Vascular: No asymmetric hyperdense vessel. Skull: Probable small soft tissue contusion at the left frontal scalp. Calvarium intact. Sinuses/Orbits: Globes and orbital soft tissues demonstrate no acute finding. Note made of a 7 mm hypodensity at the posterior left orbit (series 3, image 15), indeterminate, possibly a small varix. Paranasal sinuses and mastoid air cells are largely clear. Other: None. IMPRESSION: 1. Acute mixed density right holo hemispheric subdural hematoma measuring up to 15 mm in maximal thickness. Associated mass effect with 5 mm right-to-left shift. No hydrocephalus or ventricular trapping. 2. Additional small left subdural hematoma measuring up to 5 mm without significant mass effect. 3. Small left frontal scalp contusion.  Calvarium intact. 4. 7 mm hyperdense lesion at the posterior left orbit, indeterminate, but may reflect a small venous varix. Finding of doubtful significance in the acute setting. Critical Value/emergent results were called by telephone at the time of interpretation on 09/04/2017 at 3:40 pm to Dr. Tomi Bamberger , who verbally acknowledged these results. Electronically Signed   By: Jeannine Boga M.D.   On: 09/04/2017 15:42   Dg Chest Port 1 View  Result Date: 09/04/2017 CLINICAL DATA:  Mild third mental status. EXAM: PORTABLE CHEST 1 VIEW COMPARISON:  None. FINDINGS: Endotracheal tube in place with the tip approximately 4.5 cm above the carina. Enteric tube in the stomach. The heart size and mediastinal contours are within normal limits. Normal pulmonary vascularity. No focal consolidation,  pleural effusion, or pneumothorax. No acute osseous abnormality. IMPRESSION: 1. Appropriately positioned endotracheal and enteric tubes. 2. No active disease. Electronically Signed   By: Titus Dubin M.D.   On: 09/04/2017 15:47    Assessment/Plan: Patient is much improved.  No new seizures.  Wean vent to extubate today.  Continue drain.  Head CT in am.    LOS: 1 day    Peggyann Shoals, MD 09/05/2017, 7:19 AM

## 2017-09-05 NOTE — Progress Notes (Signed)
Pt pressures now soft, MD aware, 1L bolus ordered.

## 2017-09-05 NOTE — Progress Notes (Signed)
Chaplain visited with PT and family (son, daughter and son in law).  The family is extending additional care at bedside.  Family is very hopeful of full recovery, which does present some concern for living situation after leaving the hospital

## 2017-09-05 NOTE — Progress Notes (Signed)
eLink Physician-Brief Progress Note Patient Name: Steven Byrd DOB: Apr 21, 1955 MRN: [id number]   Date of Service  09/05/2017  HPI/Events of Note  Agitation - Request for bilateral soft wrist restraints.   eICU Interventions  Will order: 1. Soft bilateral wrist restraints.      Intervention Category Minor Interventions: Agitation / anxiety - evaluation and management  Tahir Blank Eugene 09/05/2017, 12:57 AM

## 2017-09-06 ENCOUNTER — Inpatient Hospital Stay (HOSPITAL_COMMUNITY): Payer: BLUE CROSS/BLUE SHIELD

## 2017-09-06 LAB — URINE CULTURE: Culture: NO GROWTH

## 2017-09-06 LAB — BASIC METABOLIC PANEL
Anion gap: 9 (ref 5–15)
BUN: 37 mg/dL — ABNORMAL HIGH (ref 8–23)
CALCIUM: 8.3 mg/dL — AB (ref 8.9–10.3)
CO2: 22 mmol/L (ref 22–32)
CREATININE: 1.73 mg/dL — AB (ref 0.61–1.24)
Chloride: 111 mmol/L (ref 98–111)
GFR calc non Af Amer: 40 mL/min — ABNORMAL LOW (ref >60)
GFR, EST AFRICAN AMERICAN: 46 mL/min — AB (ref >60)
Glucose, Bld: 125 mg/dL — ABNORMAL HIGH (ref 70–99)
Potassium: 4.5 mmol/L (ref 3.5–5.1)
Sodium: 142 mmol/L (ref 135–145)

## 2017-09-06 LAB — CK
CK TOTAL: 10355 U/L — AB (ref 49–397)
Total CK: 18897 U/L — ABNORMAL HIGH (ref 49–397)
Total CK: 11313 U/L — ABNORMAL HIGH (ref 49–397)

## 2017-09-06 LAB — GLUCOSE, CAPILLARY
GLUCOSE-CAPILLARY: 125 mg/dL — AB (ref 70–99)
GLUCOSE-CAPILLARY: 151 mg/dL — AB (ref 70–99)
Glucose-Capillary: 108 mg/dL — ABNORMAL HIGH (ref 70–99)
Glucose-Capillary: 122 mg/dL — ABNORMAL HIGH (ref 70–99)
Glucose-Capillary: 125 mg/dL — ABNORMAL HIGH (ref 70–99)
Glucose-Capillary: 126 mg/dL — ABNORMAL HIGH (ref 70–99)
Glucose-Capillary: 131 mg/dL — ABNORMAL HIGH (ref 70–99)

## 2017-09-06 LAB — MYOGLOBIN, SERUM: Myoglobin: 29926 ng/mL — ABNORMAL HIGH (ref 28–72)

## 2017-09-06 MED ORDER — LORAZEPAM 2 MG/ML IJ SOLN
0.5000 mg | INTRAMUSCULAR | Status: DC | PRN
  Administered 2017-09-06: 0.5 mg via INTRAVENOUS
  Filled 2017-09-06: qty 1

## 2017-09-06 MED ORDER — HYDRALAZINE HCL 20 MG/ML IJ SOLN
10.0000 mg | INTRAMUSCULAR | Status: DC | PRN
  Administered 2017-09-06 – 2017-09-07 (×3): 10 mg via INTRAVENOUS
  Filled 2017-09-06 (×3): qty 1

## 2017-09-06 MED ORDER — DEXMEDETOMIDINE HCL IN NACL 400 MCG/100ML IV SOLN
0.4000 ug/kg/h | INTRAVENOUS | Status: DC
  Administered 2017-09-06: 1 ug/kg/h via INTRAVENOUS
  Administered 2017-09-06: 1.2 ug/kg/h via INTRAVENOUS
  Administered 2017-09-06: 1.1 ug/kg/h via INTRAVENOUS
  Administered 2017-09-06: 1 ug/kg/h via INTRAVENOUS
  Administered 2017-09-06: 0.9 ug/kg/h via INTRAVENOUS
  Administered 2017-09-06: 1.1 ug/kg/h via INTRAVENOUS
  Filled 2017-09-06 (×7): qty 100

## 2017-09-06 NOTE — Progress Notes (Signed)
PULMONARY / CRITICAL CARE MEDICINE   Name: Steven Byrd MRN: [id number] DOB: 01/13/1956    ADMISSION DATE:  09/04/2017 CONSULTATION DATE: 09/04/2017  REFERRING MD:  Vickie Epley  CHIEF COMPLAINT:  S/P SDH evacuation  HISTORY OF PRESENT ILLNESS:       62 year old male with PMH significant for atrial flutter s/p ablation 03/22/2017 and since of anticoagulation and ASA, HLD, and OSA on CPAP who presented with seizures.  [place] required stitches.  Was found actively seizing on ground this morning by family followed by several witnessed seizures.  No prior history of seizures.  Was able to slowly follow some commands mostly with his right upper arm in ER.  Found to on CT head to have large right sided subdural hematoma and therefore taken emergently to OR for craniotomy.   He has been confused and hallucinating overnight.  There is no unfavorable change on the CT scan of his head this morning.  He does use alcohol and is not clear when he had his last drink.      PAST MEDICAL HISTORY :  He  has a past medical history of Atrial flutter ([place]), Dyslipidemia, and OSA on CPAP (01/20/2014).  PAST SURGICAL HISTORY: He  has a past surgical history that includes No past surgeries; A-FLUTTER ABLATION (N/A, 03/22/2017); and Craniotomy (Right, 09/04/2017).  No Known Allergies  No current facility-administered medications on file prior to encounter.    Current Outpatient Medications on File Prior to Encounter  Medication Sig  . CALCIUM-MAGNESIUM-ZINC PO Take 1 capsule by mouth daily.  . cholecalciferol (VITAMIN D) 1000 units tablet Take 1,000 Units by mouth daily.  . Coenzyme Q10 (CO Q 10 PO) Take 1 capsule by mouth daily.  [name] latanoprost (XALATAN) 0.005 % ophthalmic solution Place 1 drop into both eyes every evening.  . Multiple Vitamin (MULTIVITAMIN) capsule Take 1 capsule by mouth daily.  . simvastatin (ZOCOR) 40 MG tablet TAKE 1  TABLET BY MOUTH AT BEDTIME  . TURMERIC PO Take 1 tablet by mouth daily.  [name] acyclovir (ZOVIRAX) 800 MG tablet Take 1 tablet (800 mg total) by mouth 3 (three) times daily as needed. (Patient not taking: Reported on 09/04/2017)    FAMILY HISTORY:  His indicated that the status of his mother is unknown. He indicated that the status of his father is unknown. He indicated that the status of his maternal grandfather is unknown. He indicated that the status of his neg hx is unknown.   SOCIAL HISTORY: He  reports that he quit smoking about 18 years ago. He has never used smokeless tobacco. He reports that he drinks about 8.4 - 12.6 oz of alcohol per week. He reports that he does not use drugs.  REVIEW OF SYSTEMS:   Not obtainable at present SUBJECTIVE:  As above  VITAL SIGNS: BP (!) 152/95   Pulse (!) 58   Temp 98.1 F (36.7 C) (Oral)   Resp 20   Ht 6\' 1"  (1.854 m)   Wt 182 lb (82.6 kg)   SpO2 93%   BMI 24.01 kg/m   HEMODYNAMICS:    VENTILATOR SETTINGS:    INTAKE / OUTPUT: I/O last 3 completed shifts: In: 3508.1 [I.V.:2577; IV Piggyback:931.1] Out: 5027 [Urine:1425; Drains:230; Blood:100]  PHYSICAL EXAMINATION: General: Sedated with propofol during my examination.  He is somewhat lethargic but does respond verbally with many appropriate responses.   Neuro: Findings verbally but not entirely appropriately.  Pupils are equal.  EOMs appear to be full.  He moves his right limbs fairly vigorously there is less vigorous movement on the left.   HEENT: Incision is dry Cardiovascular: S1 and S2 are regular without murmur rub or gallop  Lungs: Aspirations are unlabored, there is symmetric air movement, there is no wheezes, no rhonchi.   Abdomen: The abdomen is soft without any organomegaly masses tenderness guarding or rebound.  There are bowel sounds present Musculoskeletal: There is no dependent edema the limbs are warm   LABS:  BMET Recent Labs  Lab 09/04/17 2020 09/05/17 0405  09/06/17 0406  NA 139 139 142  K 5.0 4.6 4.5  CL 108 112* 111  CO2 18* 18* 22  BUN 34* 40* 37*  CREATININE 2.71* 2.38* 1.73*  GLUCOSE 127* 132* 125*    Electrolytes Recent Labs  Lab 09/04/17 2013 09/04/17 2020 09/05/17 0405 09/06/17 0406  CALCIUM 7.6* 7.7* 7.2* 8.3*  MG 2.3  --   --   --   PHOS  --  5.7*  --   --     CBC Recent Labs  Lab 09/04/17 1510 09/05/17 0405  WBC 15.9* 8.6  HGB 14.6 10.5*  HCT 46.8 32.3*  PLT 251 148*    Coag's Recent Labs  Lab 09/04/17 1510  INR 1.04    Sepsis Markers Recent Labs  Lab 09/04/17 1536 09/04/17 2012 09/04/17 2013 09/05/17 0405  LATICACIDVEN 12.66* 0.9  --  1.0  PROCALCITON  --   --  35.75  --     ABG Recent Labs  Lab 09/04/17 2045 09/05/17 0540  PHART 7.302* 7.357  PCO2ART 43.1 33.5  PO2ART 397.0* 124*    Liver Enzymes Recent Labs  Lab 09/04/17 1510 09/04/17 2020  AST 134*  --   ALT 71*  --   ALKPHOS 76  --   BILITOT 2.0*  --   ALBUMIN 4.3 3.5    Cardiac Enzymes No results for input(s): TROPONINI, PROBNP in the last 168 hours.  Glucose Recent Labs  Lab 09/05/17 1514 09/05/17 2007 09/06/17 0000 09/06/17 0436 09/06/17 0750 09/06/17 1135  GLUCAP 145* 146* 126* 131* 122* 125*    Imaging Ct Head Wo Contrast  Result Date: 09/06/2017 CLINICAL DATA:  Followup subdural evacuation. Right-sided facial droop. Not moving the right side. EXAM: CT HEAD WITHOUT CONTRAST TECHNIQUE: Contiguous axial images were obtained from the base of the skull through the vertex without intravenous contrast. COMPARISON:  09/04/2017 FINDINGS: Brain: Previous right craniotomy for vacuo a shin of subdural hematoma. Subdural drain in place presently. Reduced thickness of residual subdural blood and fluid on the right, maximal at about 6 mm in the frontal region. Small amount of right-sided subdural air. Small amount of subdural blood remains evident along the left side of the falx the left frontal convexity, maximal  thickness about 5 mm. Small amount of subdural blood also along the clivus and anteriorly at the foramen magnum. This does not appear to have mass-effect upon brain appear slightly diminished compared to the study of 2 days ago. No new or increasing subdural collection. Reduced mass effect without any right-to-left shift presently. No evidence of brain infarction or intraparenchymal hemorrhage. No hydrocephalus. Vascular: No abnormal vascular finding. Skull: Postoperative changes as above. Chronic temporomandibular joint degenerative changes. Sinuses/Orbits: Normal sinuses. Small venous varix in the left orbit as noted previously. Other: None IMPRESSION: Interval right-sided craniotomy for subdural hematoma evacuation. Subdural drain in place. Maximal residual component 6 mm in the right frontal  region. No evidence of recurring collection. Small amount of subdural air as expected. Reduced mass effect with resolution of right-to-left shift. Small amount of subdural blood along the left falx, along the left frontal convexity along the clivus and anterior foramen magnum again demonstrated. Posterior fossa subdural blood appears slightly diminished. No evidence of stroke or other complication based on the CT. Electronically Signed   By: Nelson Chimes M.D.   On: 09/06/2017 07:24     STUDIES:    CULTURES: Urine culture is pending, procalcitonin is elevated to 31  ANTIBIOTICS: Zosyn   SIGNIFICANT EVENTS: Craniotomy on 7/2  LINES/TUBES:   DISCUSSION:      This is a 62 year old status post craniotomy for right subdural hematoma.  Current issues include likely UTI with a remarkably elevated procalcitonin history of OSA, history of seizures, and a history of intermittent atrial flutter.  ASSESSMENT / PLAN:  PULMONARY A: Intubation was well-tolerated.  I will be repeating a chest x-ray in the morning to determine whether or not he had aspiration accounting for his very high procalcitonin.  Sputum Gram  stain show far has shown only a few gram-positive cocci.  I am going to be continuing empiric Zosyn for now.    CARDIOVASCULAR A: Maintaining normal sinus rhythm without any acute cardiovascular issues  RENAL A: CRI with a hyper chloremia acidosis.  This is correcting with LR.     GASTROINTESTINAL A: Prophylaxis is with Pepcid   HEMATOLOGIC A: SCDs alone for prophylaxis due to CNS bleeding  INFECTIOUS A: Probable UTI, I will not switch from Zosyn until culture results are available  ENDOCRINE A: No known issues     NEUROLOGIC A: Day 2 status post evacuation of right subdural hematoma.  He is now suffering from delirium which I suspect is due to alcohol withdrawal.  We are going to use Precedex exclusively for control of his delirium which should allow [place] to interrupt the agent for a reliable neurological examination.  Continues Keppra for seizures     Lars Masson, MD [place] Pager: [phone]  09/06/2017, 2:34 PM

## 2017-09-06 NOTE — Progress Notes (Signed)
Pt extremely agitated with hallucinations and fighting nursing staff shortly after 0.5 mg ativan given. CCM made aware. Orders for restraints and precedex gtt ordered and initiated. WCTM

## 2017-09-06 NOTE — Progress Notes (Signed)
Subjective: Patient remains agitated, but MAEx4, awake alert disoriented confused  Objective: Vital signs in last 24 hours: Temp:  [97.7 F (36.5 C)-98.8 F (37.1 C)] 97.7 F (36.5 C) (07/04 0415) Pulse Rate:  [59-85] 59 (07/04 0700) Resp:  [10-25] 16 (07/04 0700) BP: (83-158)/(54-102) 143/98 (07/04 0700) SpO2:  [92 %-100 %] 95 % (07/04 0700) Arterial Line BP: (80-140)/(67-115) 135/91 (07/03 2100) FiO2 (%):  [40 %] 40 % (07/03 0750)  Intake/Output from previous day: 07/03 0701 - 07/04 0700 In: 1492.6 [I.V.:1236.5; IV Piggyback:256.1] Out: 865 [Urine:775; Drains:90] Intake/Output this shift: No intake/output data recorded.  as above  Lab Results: Lab Results  Component Value Date   WBC 8.6 09/05/2017   HGB 10.5 (L) 09/05/2017   HCT 32.3 (L) 09/05/2017   MCV 103.9 (H) 09/05/2017   PLT 148 (L) 09/05/2017   Lab Results  Component Value Date   INR 1.04 09/04/2017   BMET Lab Results  Component Value Date   NA 142 09/06/2017   K 4.5 09/06/2017   CL 111 09/06/2017   CO2 22 09/06/2017   GLUCOSE 125 (H) 09/06/2017   BUN 37 (H) 09/06/2017   CREATININE 1.73 (H) 09/06/2017   CALCIUM 8.3 (L) 09/06/2017    Studies/Results: Ct Head Wo Contrast  Result Date: 09/06/2017 CLINICAL DATA:  Followup subdural evacuation. Right-sided facial droop. Not moving the right side. EXAM: CT HEAD WITHOUT CONTRAST TECHNIQUE: Contiguous axial images were obtained from the base of the skull through the vertex without intravenous contrast. COMPARISON:  09/04/2017 FINDINGS: Brain: Previous right craniotomy for vacuo a shin of subdural hematoma. Subdural drain in place presently. Reduced thickness of residual subdural blood and fluid on the right, maximal at about 6 mm in the frontal region. Small amount of right-sided subdural air. Small amount of subdural blood remains evident along the left side of the falx the left frontal convexity, maximal thickness about 5 mm. Small amount of subdural blood also  along the clivus and anteriorly at the foramen magnum. This does not appear to have mass-effect upon brain appear slightly diminished compared to the study of 2 days ago. No new or increasing subdural collection. Reduced mass effect without any right-to-left shift presently. No evidence of brain infarction or intraparenchymal hemorrhage. No hydrocephalus. Vascular: No abnormal vascular finding. Skull: Postoperative changes as above. Chronic temporomandibular joint degenerative changes. Sinuses/Orbits: Normal sinuses. Small venous varix in the left orbit as noted previously. Other: None IMPRESSION: Interval right-sided craniotomy for subdural hematoma evacuation. Subdural drain in place. Maximal residual component 6 mm in the right frontal region. No evidence of recurring collection. Small amount of subdural air as expected. Reduced mass effect with resolution of right-to-left shift. Small amount of subdural blood along the left falx, along the left frontal convexity along the clivus and anterior foramen magnum again demonstrated. Posterior fossa subdural blood appears slightly diminished. No evidence of stroke or other complication based on the CT. Electronically Signed   By: Nelson Chimes M.D.   On: 09/06/2017 07:24   Ct Head Wo Contrast  Result Date: 09/04/2017 CLINICAL DATA:  Initial evaluation for new onset seizure, left-sided weakness. EXAM: CT HEAD WITHOUT CONTRAST TECHNIQUE: Contiguous axial images were obtained from the base of the skull through the vertex without intravenous contrast. COMPARISON:  None. FINDINGS: Brain: Acute mixed density subdural hematoma overlying the right cerebral convexity measures up to 15 mm in thickness. Additional small acute subdural hemorrhage overlies the left frontal convexity, measuring up to 5 mm in maximal thickness. Extension along  the falx and left tentorium, with blood seen at the foramen magnum. Associated mass effect with 5 mm of right-to-left shift. No hydrocephalus  or ventricular trapping. Basilar cisterns remain patent. No acute large vessel territory infarct. No mass lesion. No other acute intracranial abnormality. Vascular: No asymmetric hyperdense vessel. Skull: Probable small soft tissue contusion at the left frontal scalp. Calvarium intact. Sinuses/Orbits: Globes and orbital soft tissues demonstrate no acute finding. Note made of a 7 mm hypodensity at the posterior left orbit (series 3, image 15), indeterminate, possibly a small varix. Paranasal sinuses and mastoid air cells are largely clear. Other: None. IMPRESSION: 1. Acute mixed density right holo hemispheric subdural hematoma measuring up to 15 mm in maximal thickness. Associated mass effect with 5 mm right-to-left shift. No hydrocephalus or ventricular trapping. 2. Additional small left subdural hematoma measuring up to 5 mm without significant mass effect. 3. Small left frontal scalp contusion.  Calvarium intact. 4. 7 mm hyperdense lesion at the posterior left orbit, indeterminate, but may reflect a small venous varix. Finding of doubtful significance in the acute setting. Critical Value/emergent results were called by telephone at the time of interpretation on 09/04/2017 at 3:40 pm to Dr. Tomi Bamberger , who verbally acknowledged these results. Electronically Signed   By: Jeannine Boga M.D.   On: 09/04/2017 15:42   Portable Chest Xray  Result Date: 09/05/2017 CLINICAL DATA:  Intubation, history of seizures, fall EXAM: PORTABLE CHEST 1 VIEW COMPARISON:  Portable exam 0627 hours compared to 09/04/2017 FINDINGS: Tip of endotracheal tube projects 6.3 cm above carina. Nasogastric tube extends into stomach. Normal heart size, mediastinal contours, and pulmonary vascularity. Mild bibasilar atelectasis. Remaining lungs clear. No pleural effusion or pneumothorax. IMPRESSION: Mild bibasilar atelectasis. Electronically Signed   By: Lavonia Dana M.D.   On: 09/05/2017 08:40   Dg Chest Port 1 View  Result Date:  09/04/2017 CLINICAL DATA:  Mild third mental status. EXAM: PORTABLE CHEST 1 VIEW COMPARISON:  None. FINDINGS: Endotracheal tube in place with the tip approximately 4.5 cm above the carina. Enteric tube in the stomach. The heart size and mediastinal contours are within normal limits. Normal pulmonary vascularity. No focal consolidation, pleural effusion, or pneumothorax. No acute osseous abnormality. IMPRESSION: 1. Appropriately positioned endotracheal and enteric tubes. 2. No active disease. Electronically Signed   By: Titus Dubin M.D.   On: 09/04/2017 15:47    Assessment/Plan: CT looks good, mobilize as tolerated  Estimated body mass index is 24.01 kg/m as calculated from the following:   Height as of this encounter: 6\' 1"  (1.854 m).   Weight as of this encounter: 82.6 kg (182 lb).    LOS: 2 days    Devra Stare S 09/06/2017, 7:29 AM

## 2017-09-07 ENCOUNTER — Inpatient Hospital Stay (HOSPITAL_COMMUNITY): Payer: BLUE CROSS/BLUE SHIELD

## 2017-09-07 LAB — CBC WITH DIFFERENTIAL/PLATELET
Abs Immature Granulocytes: 0 10*3/uL (ref 0.0–0.1)
BASOS PCT: 0 %
Basophils Absolute: 0 10*3/uL (ref 0.0–0.1)
EOS PCT: 0 %
Eosinophils Absolute: 0 10*3/uL (ref 0.0–0.7)
HEMATOCRIT: 33.9 % — AB (ref 39.0–52.0)
Hemoglobin: 11.3 g/dL — ABNORMAL LOW (ref 13.0–17.0)
IMMATURE GRANULOCYTES: 0 %
LYMPHS ABS: 1 10*3/uL (ref 0.7–4.0)
Lymphocytes Relative: 11 %
MCH: 33.9 pg (ref 26.0–34.0)
MCHC: 33.3 g/dL (ref 30.0–36.0)
MCV: 101.8 fL — ABNORMAL HIGH (ref 78.0–100.0)
MONO ABS: 0.5 10*3/uL (ref 0.1–1.0)
MONOS PCT: 6 %
Neutro Abs: 7.4 10*3/uL (ref 1.7–7.7)
Neutrophils Relative %: 83 %
Platelets: 189 10*3/uL (ref 150–400)
RBC: 3.33 MIL/uL — ABNORMAL LOW (ref 4.22–5.81)
RDW: 12.9 % (ref 11.5–15.5)
WBC: 9 10*3/uL (ref 4.0–10.5)

## 2017-09-07 LAB — GLUCOSE, CAPILLARY
GLUCOSE-CAPILLARY: 103 mg/dL — AB (ref 70–99)
GLUCOSE-CAPILLARY: 117 mg/dL — AB (ref 70–99)
GLUCOSE-CAPILLARY: 128 mg/dL — AB (ref 70–99)
Glucose-Capillary: 100 mg/dL — ABNORMAL HIGH (ref 70–99)
Glucose-Capillary: 95 mg/dL (ref 70–99)
Glucose-Capillary: 102 mg/dL — ABNORMAL HIGH (ref 70–99)

## 2017-09-07 LAB — BASIC METABOLIC PANEL
ANION GAP: 8 (ref 5–15)
BUN: 25 mg/dL — ABNORMAL HIGH (ref 8–23)
CHLORIDE: 110 mmol/L (ref 98–111)
CO2: 23 mmol/L (ref 22–32)
CREATININE: 1.37 mg/dL — AB (ref 0.61–1.24)
Calcium: 8.6 mg/dL — ABNORMAL LOW (ref 8.9–10.3)
GFR calc non Af Amer: 53 mL/min — ABNORMAL LOW (ref >60)
Glucose, Bld: 116 mg/dL — ABNORMAL HIGH (ref 70–99)
Potassium: 3.6 mmol/L (ref 3.5–5.1)
SODIUM: 141 mmol/L (ref 135–145)

## 2017-09-07 LAB — CK
CK TOTAL: 7355 U/L — AB (ref 49–397)
Total CK: 8808 U/L — ABNORMAL HIGH (ref 49–397)

## 2017-09-07 LAB — CULTURE, RESPIRATORY

## 2017-09-07 LAB — CULTURE, RESPIRATORY W GRAM STAIN
Culture: NORMAL
Special Requests: NORMAL

## 2017-09-07 MED ORDER — BACITRACIN-NEOMYCIN-POLYMYXIN OINTMENT TUBE
TOPICAL_OINTMENT | CUTANEOUS | Status: DC | PRN
  Administered 2017-09-07: 22:00:00 via TOPICAL
  Filled 2017-09-07: qty 14

## 2017-09-07 MED ORDER — METOPROLOL TARTRATE 5 MG/5ML IV SOLN
INTRAVENOUS | Status: AC
  Filled 2017-09-07: qty 5

## 2017-09-07 MED ORDER — METOPROLOL TARTRATE 5 MG/5ML IV SOLN
2.5000 mg | Freq: Once | INTRAVENOUS | Status: AC
  Administered 2017-09-07: 2.5 mg via INTRAVENOUS
  Filled 2017-09-07: qty 5

## 2017-09-07 MED ORDER — METOPROLOL TARTRATE 5 MG/5ML IV SOLN
2.5000 mg | Freq: Four times a day (QID) | INTRAVENOUS | Status: DC
  Administered 2017-09-07 – 2017-09-09 (×8): 2.5 mg via INTRAVENOUS
  Filled 2017-09-07 (×9): qty 5

## 2017-09-07 MED ORDER — DILTIAZEM HCL-DEXTROSE 100-5 MG/100ML-% IV SOLN (PREMIX)
5.0000 mg/h | INTRAVENOUS | Status: DC
  Administered 2017-09-07: 5 mg/h via INTRAVENOUS
  Administered 2017-09-07: 15 mg/h via INTRAVENOUS
  Administered 2017-09-08: 5 mg/h via INTRAVENOUS
  Administered 2017-09-09 (×2): 15 mg/h via INTRAVENOUS
  Filled 2017-09-07 (×6): qty 100

## 2017-09-07 MED ORDER — METOPROLOL TARTRATE 5 MG/5ML IV SOLN
2.5000 mg | Freq: Once | INTRAVENOUS | Status: AC
  Administered 2017-09-07: 2.5 mg via INTRAVENOUS

## 2017-09-07 NOTE — Progress Notes (Signed)
Pt hallucinating that wall will fall on him if he does not escape. Unable to calm pt down. Warren Lacy MD okayed order to increase precedex gtt to 1.7 if needed.

## 2017-09-07 NOTE — Progress Notes (Signed)
PULMONARY / CRITICAL CARE MEDICINE   Name: Steven Byrd MRN: [id number] DOB: 08-Mar-1955    ADMISSION DATE:  09/04/2017 CONSULTATION DATE: 09/04/2017  REFERRING MD:  Vickie Epley  CHIEF COMPLAINT:  S/P SDH evacuation  HISTORY OF PRESENT ILLNESS:       62 year old male with PMH significant for atrial flutter s/p ablation 03/22/2017 and since of anticoagulation and ASA, HLD, and OSA on CPAP who presented with seizures.  Apparently [place] required stitches.  Was found actively seizing on ground this morning by family followed by several witnessed seizures.  No prior history of seizures.  Was able to slowly follow some commands mostly with his right upper arm in ER.  Found to on CT head to have large right sided subdural hematoma and therefore taken emergently to OR for craniotomy.   He began being very confused and hallucinating on the evening of 7/3. Precedex was ininitated for DT's. He continues on Precedex and was hallucinating overnight, fearing that the ceiling was falling down on him. His is not orientated to day for me despite yesterday being a holiday.       PAST MEDICAL HISTORY :  He  has a past medical history of Atrial flutter ([place]), Dyslipidemia, and OSA on CPAP (01/20/2014).  PAST SURGICAL HISTORY: He  has a past surgical history that includes No past surgeries; A-FLUTTER ABLATION (N/A, 03/22/2017); and Craniotomy (Right, 09/04/2017).  No Known Allergies  No current facility-administered medications on file prior to encounter.    Current Outpatient Medications on File Prior to Encounter  Medication Sig  . CALCIUM-MAGNESIUM-ZINC PO Take 1 capsule by mouth daily.  . cholecalciferol (VITAMIN D) 1000 units tablet Take 1,000 Units by mouth daily.  . Coenzyme Q10 (CO Q 10 PO) Take 1 capsule by mouth daily.  [name] latanoprost (XALATAN) 0.005 % ophthalmic solution Place 1 drop into both eyes every evening.  . Multiple Vitamin  (MULTIVITAMIN) capsule Take 1 capsule by mouth daily.  . simvastatin (ZOCOR) 40 MG tablet TAKE 1 TABLET BY MOUTH AT BEDTIME  . TURMERIC PO Take 1 tablet by mouth daily.  [name] acyclovir (ZOVIRAX) 800 MG tablet Take 1 tablet (800 mg total) by mouth 3 (three) times daily as needed. (Patient not taking: Reported on 09/04/2017)    FAMILY HISTORY:  His indicated that the status of his mother is unknown. He indicated that the status of his father is unknown. He indicated that the status of his maternal grandfather is unknown. He indicated that the status of his neg hx is unknown.   SOCIAL HISTORY: He  reports that he quit smoking about 18 years ago. He has never used smokeless tobacco. He reports that he drinks about 8.4 - 12.6 oz of alcohol per week. He reports that he does not use drugs.  REVIEW OF SYSTEMS:   Not obtainable at present SUBJECTIVE:  As above  VITAL SIGNS: BP (!) 143/91   Pulse 65   Temp 100.2 F (37.9 C) (Axillary)   Resp 19   Ht 6\' 1"  (1.854 m)   Wt 182 lb (82.6 kg)   SpO2 94%   BMI 24.01 kg/m   HEMODYNAMICS:    VENTILATOR SETTINGS:    INTAKE / OUTPUT: I/O last 3 completed shifts: In: 4083.7 [P.O.:160; I.V.:3689.2; IV Piggyback:234.5] Out: 2500 [Urine:2450; Drains:50]  PHYSICAL EXAMINATION: General: Sedated on 1.2 mcg of Precedex during my examination.  He is very verbally interactive on that dose.  He is in no acute distress.     Neuro: Not oriented today.  Reporting hallucinations overnight including the ceiling falling and animals and bugs in the room.  He moves all fours on request but is weaker on the left.  Pupils are equal and the face is symmetric.  The wound is well opposed and dry.  Cardiovascular: S1 and S2 are regular without murmur rub or gallop   Lungs: Respirations are unlabored, there is symmetric air movement, no wheezes no rhonchi, no rales    Abdomen: The abdomen is soft without any organomegaly masses tenderness guarding or rebound.  He is  anicteric.   Musculoskeletal: There is no dependent edema the limbs are warm   LABS:  BMET Recent Labs  Lab 09/05/17 0405 09/06/17 0406 09/07/17 0334  NA 139 142 141  K 4.6 4.5 3.6  CL 112* 111 110  CO2 18* 22 23  BUN 40* 37* 25*  CREATININE 2.38* 1.73* 1.37*  GLUCOSE 132* 125* 116*    Electrolytes Recent Labs  Lab 09/04/17 2013 09/04/17 2020 09/05/17 0405 09/06/17 0406 09/07/17 0334  CALCIUM 7.6* 7.7* 7.2* 8.3* 8.6*  MG 2.3  --   --   --   --   PHOS  --  5.7*  --   --   --     CBC Recent Labs  Lab 09/04/17 1510 09/05/17 0405 09/07/17 0334  WBC 15.9* 8.6 9.0  HGB 14.6 10.5* 11.3*  HCT 46.8 32.3* 33.9*  PLT 251 148* 189    Coag's Recent Labs  Lab 09/04/17 1510  INR 1.04    Sepsis Markers Recent Labs  Lab 09/04/17 1536 09/04/17 2012 09/04/17 2013 09/05/17 0405  LATICACIDVEN 12.66* 0.9  --  1.0  PROCALCITON  --   --  35.75  --     ABG Recent Labs  Lab 09/04/17 2045 09/05/17 0540  PHART 7.302* 7.357  PCO2ART 43.1 33.5  PO2ART 397.0* 124*    Liver Enzymes Recent Labs  Lab 09/04/17 1510 09/04/17 2020  AST 134*  --   ALT 71*  --   ALKPHOS 76  --   BILITOT 2.0*  --   ALBUMIN 4.3 3.5    Cardiac Enzymes No results for input(s): TROPONINI, PROBNP in the last 168 hours.  Glucose Recent Labs  Lab 09/06/17 1135 09/06/17 1555 09/06/17 1927 09/06/17 2332 09/07/17 0348 09/07/17 0718  GLUCAP 125* 125* 151* 108* 103* 117*    Imaging Dg Chest Port 1 View  Result Date: 09/07/2017 CLINICAL DATA:  Aspiration. EXAM: PORTABLE CHEST 1 VIEW COMPARISON:  09/05/2017. FINDINGS: Interim extubation and removal of NG tube. Cardiomegaly with mild pulmonary venous congestion. New right perihilar atelectasis/infiltrate. Mild bibasilar atelectasis again noted. No pleural effusion or pneumothorax. IMPRESSION: 1. Interim extubation removal of NG tube. 2. Cardiomegaly with pulmonary venous congestion. 3. New right perihilar atelectasis/infiltrate.  Persistent low lung volumes with mild bibasilar atelectasis again noted. Electronically Signed   By: Marcello Moores  Register   On: 09/07/2017 06:23     STUDIES:    CULTURES: Urine culture is pending, procalcitonin is elevated to 31  ANTIBIOTICS: Zosyn   SIGNIFICANT EVENTS: Craniotomy on 7/2  LINES/TUBES:   DISCUSSION:      This is a 62 year old status post craniotomy for right subdural hematoma.  Current issues include likely UTI with a remarkably elevated procalcitonin history of OSA, history of seizures, and a history of intermittent atrial flutter.  ASSESSMENT / PLAN:  PULMONARY A: Extubation has been well-tolerated.  Chest x-ray  this morning suggest a right perihilar infiltrate.  Although he does not have a cough or significant physical findings he did have a procalcitonin of 31 and I am going to continue to cover him for aspiration with Zosyn.      CARDIOVASCULAR A: Maintaining normal sinus rhythm without any acute cardiovascular issues.  No autonomic instability associated with his delirium  RENAL A: CRI is stable.       GASTROINTESTINAL A: Prophylaxis is with Pepcid   HEMATOLOGIC A: SCDs alone for prophylaxis due to CNS bleeding  INFECTIOUS A: Urine showed no growth.  Presumably we are treating aspiration with his Zosyn.   ENDOCRINE A: No known issues     NEUROLOGIC A: Day 3 status post evacuation of right subdural hematoma.  He is now suffering from delirium which I suspect is due to alcohol withdrawal.  We are using Precedex exclusively for control of his delirium, from a structural standpoint he is appeared to be stable and I will discuss with neurosurgery whether they have any objection to me introducing some benzodiazepines for better control.  He continues Keppra for seizures.  Continues Keppra for seizures     Lars Masson, MD [place] Pager: [phone]  09/07/2017, 7:56 AM

## 2017-09-07 NOTE — Progress Notes (Signed)
Subjective: Patient reports "I'm doing ok"  Objective: Vital signs in last 24 hours: Temp:  [98.1 F (36.7 C)-100.2 F (37.9 C)] 100.2 F (37.9 C) (07/05 0700) Pulse Rate:  [58-70] 65 (07/05 0700) Resp:  [13-29] 19 (07/05 0700) BP: (117-185)/(72-119) 143/91 (07/05 0700) SpO2:  [92 %-97 %] 94 % (07/05 0700)  Intake/Output from previous day: 07/04 0701 - 07/05 0700 In: 3166.2 [P.O.:160; I.V.:2856.2; IV Piggyback:150] Out: 2500 [Urine:2450; Drains:50] Intake/Output this shift: No intake/output data recorded.  Alert, answers questions, anxious. Wife present reports poor sleep due to agitation last two nights. Left hand weakness, but good strength BLE and RUE. Follows commands with short attention span. Incision well-approximated without erythema, drainage, or swelling. Dr. Vertell Limber reviewed CT - much improved.  Lab Results: Recent Labs    09/05/17 0405 09/07/17 0334  WBC 8.6 9.0  HGB 10.5* 11.3*  HCT 32.3* 33.9*  PLT 148* 189   BMET Recent Labs    09/06/17 0406 09/07/17 0334  NA 142 141  K 4.5 3.6  CL 111 110  CO2 22 23  GLUCOSE 125* 116*  BUN 37* 25*  CREATININE 1.73* 1.37*  CALCIUM 8.3* 8.6*    Studies/Results: Ct Head Wo Contrast  Result Date: 09/06/2017 CLINICAL DATA:  Followup subdural evacuation. Right-sided facial droop. Not moving the right side. EXAM: CT HEAD WITHOUT CONTRAST TECHNIQUE: Contiguous axial images were obtained from the base of the skull through the vertex without intravenous contrast. COMPARISON:  09/04/2017 FINDINGS: Brain: Previous right craniotomy for vacuo a shin of subdural hematoma. Subdural drain in place presently. Reduced thickness of residual subdural blood and fluid on the right, maximal at about 6 mm in the frontal region. Small amount of right-sided subdural air. Small amount of subdural blood remains evident along the left side of the falx the left frontal convexity, maximal thickness about 5 mm. Small amount of subdural blood also along  the clivus and anteriorly at the foramen magnum. This does not appear to have mass-effect upon brain appear slightly diminished compared to the study of 2 days ago. No new or increasing subdural collection. Reduced mass effect without any right-to-left shift presently. No evidence of brain infarction or intraparenchymal hemorrhage. No hydrocephalus. Vascular: No abnormal vascular finding. Skull: Postoperative changes as above. Chronic temporomandibular joint degenerative changes. Sinuses/Orbits: Normal sinuses. Small venous varix in the left orbit as noted previously. Other: None IMPRESSION: Interval right-sided craniotomy for subdural hematoma evacuation. Subdural drain in place. Maximal residual component 6 mm in the right frontal region. No evidence of recurring collection. Small amount of subdural air as expected. Reduced mass effect with resolution of right-to-left shift. Small amount of subdural blood along the left falx, along the left frontal convexity along the clivus and anterior foramen magnum again demonstrated. Posterior fossa subdural blood appears slightly diminished. No evidence of stroke or other complication based on the CT. Electronically Signed   By: Nelson Chimes M.D.   On: 09/06/2017 07:24   Dg Chest Port 1 View  Result Date: 09/07/2017 CLINICAL DATA:  Aspiration. EXAM: PORTABLE CHEST 1 VIEW COMPARISON:  09/05/2017. FINDINGS: Interim extubation and removal of NG tube. Cardiomegaly with mild pulmonary venous congestion. New right perihilar atelectasis/infiltrate. Mild bibasilar atelectasis again noted. No pleural effusion or pneumothorax. IMPRESSION: 1. Interim extubation removal of NG tube. 2. Cardiomegaly with pulmonary venous congestion. 3. New right perihilar atelectasis/infiltrate. Persistent low lung volumes with mild bibasilar atelectasis again noted. Electronically Signed   By: Marcello Moores  Register   On: 09/07/2017 06:23  Assessment/Plan: Improving  LOS: 3 days  Continue supportive  care.   Verdis Prime 09/07/2017, 8:01 AM

## 2017-09-08 LAB — GLUCOSE, CAPILLARY: Glucose-Capillary: 91 mg/dL (ref 70–99)

## 2017-09-08 LAB — CK
CK TOTAL: 2690 U/L — AB (ref 49–397)
Total CK: 4410 U/L — ABNORMAL HIGH (ref 49–397)
Total CK: 3826 U/L — ABNORMAL HIGH (ref 49–397)

## 2017-09-08 MED ORDER — METOPROLOL TARTRATE 5 MG/5ML IV SOLN
2.5000 mg | Freq: Once | INTRAVENOUS | Status: AC
  Administered 2017-09-08: 2.5 mg via INTRAVENOUS

## 2017-09-08 NOTE — Progress Notes (Addendum)
  NEUROSURGERY PROGRESS NOTE   No issues overnight. Pt reports minimal HA. Has some visual hallucinations although no change in mental status - describes seeing "black hairs" on objects.  EXAM:  BP (!) 140/98 (BP Location: Right Arm)   Pulse 68   Temp 98.1 F (36.7 C) (Oral)   Resp (!) 21   Ht 6\' 1"  (1.854 m)   Wt 82.6 kg (182 lb)   SpO2 99%   BMI 24.01 kg/m   Awake, alert, oriented  Speech fluent, appropriate  CN grossly intact  5/5 BUE/BLE, minimal left pronator drift  IMPRESSION:  62 y.o. male POD# 4 s/p right SDH evac, doing well Paroxysmal Afib w RVR, s/p ablation  PLAN: - Can transfer to floor, will need tele - PT/OT eval for dispo planning - Will monitor afib, is on metoprolol. CPAP at night. If another episode, will get EP input.

## 2017-09-08 NOTE — Evaluation (Signed)
Physical Therapy Evaluation Patient Details Name: Steven Byrd MRN: [id number] DOB: Dec 19, 1955 Today's Date: 09/08/2017   History of Present Illness  pt is a 62 y/o male with h/o aflutter and OSA, admitted after being found by a friend on the ground actively seizing, CT showed R SDH s/p crani for evacuation.  Clinical Impression  Pt admitted with/for with seizure and R SDH by CT.  Pt shows some mild instability with gait and dynamic tasks, but not overt deviation and no LOB.  Pt currently limited functionally due to the problems listed below.  (see problems list.)  Pt will benefit from PT to maximize function and safety to be able to get home safely with limited available assist.     Follow Up Recommendations Other (comment);No PT follow up(Expect pt to improve further before d/c)    Equipment Recommendations  None recommended by PT    Recommendations for Other Services       Precautions / Restrictions Precautions Precautions: Fall      Mobility  Bed Mobility Overal bed mobility: Needs Assistance Bed Mobility: Supine to Sit;Sit to Supine     Supine to sit: Independent Sit to supine: Independent      Transfers Overall transfer level: Needs assistance   Transfers: Sit to/from Stand Sit to Stand: Supervision            Ambulation/Gait Ambulation/Gait assistance: Min guard;Supervision Gait Distance (Feet): 400 Feet Assistive device: None Gait Pattern/deviations: Step-through pattern Gait velocity: able to reach age appropriate speeds. Gait velocity interpretation: >2.62 ft/sec, indicative of community ambulatory General Gait Details: pt was initially guarded then generally steady with the exception of mild deviation with scanning L/R and abrupt directional changes.  Pt "clipped" the corners when turning right to go down another hall.  Stairs Stairs: Yes Stairs assistance: Supervision Stair Management: No rails;One rail Right;Alternating  pattern;Forwards Number of Stairs: 3 General stair comments: safe with rails  Wheelchair Mobility    Modified Rankin (Stroke Patients Only) Modified Rankin (Stroke Patients Only) Pre-Morbid Rankin Score: No symptoms Modified Rankin: Moderate disability     Balance Overall balance assessment: Needs assistance   Sitting balance-Leahy Scale: Good       Standing balance-Leahy Scale: Good                   Standardized Balance Assessment Standardized Balance Assessment : Berg Balance Test;Dynamic Gait Index Berg Balance Test Sit to Stand: Able to stand  independently using hands Standing Unsupported: Able to stand safely 2 minutes Sitting with Back Unsupported but Feet Supported on Floor or Stool: Able to sit safely and securely 2 minutes Stand to Sit: Sits safely with minimal use of hands Transfers: Able to transfer safely, minor use of hands From Standing Position, Pick up Object from Floor: Able to pick up shoe, needs supervision From Standing Position, Turn to Look Behind Over each Shoulder: Looks behind from both sides and weight shifts well Turn 360 Degrees: Able to turn 360 degrees safely in 4 seconds or less Dynamic Gait Index Level Surface: Normal Change in Gait Speed: Normal Gait with Horizontal Head Turns: Mild Impairment Gait with Vertical Head Turns: Mild Impairment Gait and Pivot Turn: Normal Steps: Mild Impairment       Pertinent Vitals/Pain Pain Assessment: No/denies pain    Home Living Family/patient expects to be discharged to:: Private residence Living Arrangements: Spouse/significant other;Other (Comment)(significant other not always there) Available Help at Discharge: Family;Friend(s);Available PRN/intermittently Type of Home: House Home Access: Stairs to enter  Entrance Stairs-Rails: Right;Left Entrance Stairs-Number of Steps: several Home Layout: Multi-level Home Equipment: Walker - 2 wheels;Bedside commode;Shower seat      Prior  Function Level of Independence: Independent               Hand Dominance        Extremity/Trunk Assessment   Upper Extremity Assessment Upper Extremity Assessment: Overall WFL for tasks assessed    Lower Extremity Assessment Lower Extremity Assessment: Overall WFL for tasks assessed(mild proximal musculature weakness)       Communication   Communication: No difficulties  Cognition Arousal/Alertness: Awake/alert Behavior During Therapy: WFL for tasks assessed/performed(impulsivities) Overall Cognitive Status: Within Functional Limits for tasks assessed(but not making the best choices from a safety standpt)                                        General Comments General comments (skin integrity, edema, etc.): Dtr is very uncomfortable with pt staying at home alone.  Asked family to consider options  such as home with dtr in [place], significant other to stay with him consistently etc.    Exercises     Assessment/Plan    PT Assessment Patient needs continued PT services  PT Problem List Decreased balance;Decreased mobility;Decreased coordination       PT Treatment Interventions Gait training;Functional mobility training;Therapeutic activities;Balance training;Patient/family education;Neuromuscular re-education    PT Goals (Current goals can be found in the Care Plan section)  Acute Rehab PT Goals Patient Stated Goal: home independent PT Goal Formulation: With patient Time For Goal Achievement: 09/15/17 Potential to Achieve Goals: Good    Frequency Min 3X/week   Barriers to discharge Decreased caregiver support      Co-evaluation               AM-PAC PT "6 Clicks" Daily Activity  Outcome Measure Difficulty turning over in bed (including adjusting bedclothes, sheets and blankets)?: None Difficulty moving from lying on back to sitting on the side of the bed? : None Difficulty sitting down on and standing up from a chair with arms  (e.g., wheelchair, bedside commode, etc,.)?: None Help needed moving to and from a bed to chair (including a wheelchair)?: A Little Help needed walking in hospital room?: A Little Help needed climbing 3-5 steps with a railing? : A Little 6 Click Score: 21    End of Session   Activity Tolerance: Patient tolerated treatment well Patient left: in bed;with call bell/phone within reach;with family/visitor present Nurse Communication: Mobility status PT Visit Diagnosis: Unsteadiness on feet (R26.81);Other symptoms and signs involving the nervous system (R29.898)    Time: 7915-0569 PT Time Calculation (min) (ACUTE ONLY): 32 min   Charges:   PT Evaluation $PT Eval Moderate Complexity: 1 Mod PT Treatments $Gait Training: 8-22 mins   PT G Codes:        2017-09-21  Donnella Sham, PT [phone]  (pager)  Tessie Fass Jaelin Devincentis 09-21-17, 4:51 PM

## 2017-09-08 NOTE — Progress Notes (Signed)
PULMONARY / CRITICAL CARE MEDICINE   Name: Steven Byrd MRN: [id number] DOB: Aug 17, 1955    ADMISSION DATE:  09/04/2017 CONSULTATION DATE: 09/04/2017  REFERRING MD:  Vickie Epley  CHIEF COMPLAINT:  S/P SDH evacuation  HISTORY OF PRESENT ILLNESS:       62 year old male with PMH significant for atrial flutter s/p ablation 03/22/2017 and since off anticoagulation and ASA, HLD, and OSA on CPAP who presented with seizures.  [place] that required stitches.  Was found actively seizing on ground this morning by family followed by several witnessed seizures.  No prior history of seizures.  Was able to slowly follow some commands mostly with his right upper arm in ER.  Found to on CT head to have large right sided subdural hematoma and therefore taken emergently to OR for craniotomy.    Yesterday he was transiently in atrial fibrillation with a rapid ventricular response requiring the addition of both a Cardizem infusion and intermittent Lopressor for rate control.  He is subsequently reverted to sinus rhythm and the Cardizem has been weaned off.  He has not been on Precedex overnight and he is very commonly interactive this morning.  He is still having some hallucinations but is much more appropriately interactive.  He is not having cough or dyspnea     PAST MEDICAL HISTORY :  He  has a past medical history of Atrial flutter ([place]), Dyslipidemia, and OSA on CPAP (01/20/2014).  PAST SURGICAL HISTORY: He  has a past surgical history that includes No past surgeries; A-FLUTTER ABLATION (N/A, 03/22/2017); and Craniotomy (Right, 09/04/2017).  No Known Allergies  No current facility-administered medications on file prior to encounter.    Current Outpatient Medications on File Prior to Encounter  Medication Sig  . CALCIUM-MAGNESIUM-ZINC PO Take 1 capsule by mouth daily.  . cholecalciferol (VITAMIN D) 1000 units tablet Take 1,000 Units by mouth  daily.  . Coenzyme Q10 (CO Q 10 PO) Take 1 capsule by mouth daily.  [name] latanoprost (XALATAN) 0.005 % ophthalmic solution Place 1 drop into both eyes every evening.  . Multiple Vitamin (MULTIVITAMIN) capsule Take 1 capsule by mouth daily.  . simvastatin (ZOCOR) 40 MG tablet TAKE 1 TABLET BY MOUTH AT BEDTIME  . TURMERIC PO Take 1 tablet by mouth daily.  [name] acyclovir (ZOVIRAX) 800 MG tablet Take 1 tablet (800 mg total) by mouth 3 (three) times daily as needed. (Patient not taking: Reported on 09/04/2017)    FAMILY HISTORY:  His indicated that the status of his mother is unknown. He indicated that the status of his father is unknown. He indicated that the status of his maternal grandfather is unknown. He indicated that the status of his neg hx is unknown.   SOCIAL HISTORY: He  reports that he quit smoking about 18 years ago. He has never used smokeless tobacco. He reports that he drinks about 8.4 - 12.6 oz of alcohol per week. He reports that he does not use drugs.  REVIEW OF SYSTEMS:   Not obtainable at present SUBJECTIVE:  As above  VITAL SIGNS: BP 132/82   Pulse 65   Temp 99.4 F (37.4 C) (Oral)   Resp (!) 27   Ht 6\' 1"  (1.854 m)   Wt 182 lb (82.6 kg)   SpO2 97%   BMI 24.01 kg/m   HEMODYNAMICS:    VENTILATOR SETTINGS:    INTAKE / OUTPUT: I/O last 3 completed shifts: In: 3867.9 [P.O.:150;  I.V.:3563.5; IV Piggyback:154.4] Out: 1800 [Urine:1800]  PHYSICAL EXAMINATION: General: Calmly and appropriately interacting, matter-of-factly reporting his hallucinations. Neuro: Not oriented to exact date but speech content is otherwise appropriate.  His pupils are equal he is moving all fours with much better strength on the left side than previously.  [name]   HEENT: Incision is dry Cardiovascular: S1 and S2 are again regular without murmur rub or gallop   Lungs: Respirations are unlabored, there is symmetric air movement, no wheezes, no rhonchi.   Abdomen: The abdomen is soft without any  organomegaly masses or tenderness.  He is anicteric.   Musculoskeletal: There is no dependent edema the limbs are warm   LABS:  BMET Recent Labs  Lab 09/05/17 0405 09/06/17 0406 09/07/17 0334  NA 139 142 141  K 4.6 4.5 3.6  CL 112* 111 110  CO2 18* 22 23  BUN 40* 37* 25*  CREATININE 2.38* 1.73* 1.37*  GLUCOSE 132* 125* 116*    Electrolytes Recent Labs  Lab 09/04/17 2013 09/04/17 2020 09/05/17 0405 09/06/17 0406 09/07/17 0334  CALCIUM 7.6* 7.7* 7.2* 8.3* 8.6*  MG 2.3  --   --   --   --   PHOS  --  5.7*  --   --   --     CBC Recent Labs  Lab 09/04/17 1510 09/05/17 0405 09/07/17 0334  WBC 15.9* 8.6 9.0  HGB 14.6 10.5* 11.3*  HCT 46.8 32.3* 33.9*  PLT 251 148* 189    Coag's Recent Labs  Lab 09/04/17 1510  INR 1.04    Sepsis Markers Recent Labs  Lab 09/04/17 1536 09/04/17 2012 09/04/17 2013 09/05/17 0405  LATICACIDVEN 12.66* 0.9  --  1.0  PROCALCITON  --   --  35.75  --     ABG Recent Labs  Lab 09/04/17 2045 09/05/17 0540  PHART 7.302* 7.357  PCO2ART 43.1 33.5  PO2ART 397.0* 124*    Liver Enzymes Recent Labs  Lab 09/04/17 1510 09/04/17 2020  AST 134*  --   ALT 71*  --   ALKPHOS 76  --   BILITOT 2.0*  --   ALBUMIN 4.3 3.5    Cardiac Enzymes No results for input(s): TROPONINI, PROBNP in the last 168 hours.  Glucose Recent Labs  Lab 09/07/17 0348 09/07/17 0718 09/07/17 1106 09/07/17 1543 09/07/17 2015 09/07/17 2316  GLUCAP 103* 117* 100* 128* 95 102*    Imaging No results found.   STUDIES:    CULTURES: Urine culture is pending, procalcitonin is elevated to 31  ANTIBIOTICS: Zosyn   SIGNIFICANT EVENTS: Craniotomy on 7/2  LINES/TUBES:   DISCUSSION:      This is a 62 year old status post craniotomy for right subdural hematoma.  Current issues include a remarkably elevated procalcitonin on admission, history of OSA, history of seizures, and a history of intermittent atrial flutter.  ASSESSMENT /  PLAN:  PULMONARY A: He has a benign exam today and no productive cough I am going to discontinue the Zosyn that was in place for resumed aspiration.  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  RENAL A: CRI   GASTROINTESTINAL A: Prophylaxis is with Pepcid   HEMATOLOGIC A: SCDs alone for prophylaxis due to CNS bleeding  INFECTIOUS A: No growth in his urine it to account for the elevated procalcitonin on presentation.  There was a question of aspiration  on his chest x-ray.  I have discontinued his Zosyn today and will observe.   ENDOCRINE A: No known issues     NEUROLOGIC A: Day 3 status post evacuation of right subdural hematoma.  He is now suffering from delirium which I suspect is due to alcohol withdrawal.  Delirium is remarkably improved and we have been able to remain off of Precedex overnight.    Continues Keppra for seizures     Lars Masson, MD [place] Pager: [phone]  09/08/2017, 7:33 AM

## 2017-09-09 LAB — CK
Total CK: 2287 U/L — ABNORMAL HIGH (ref 49–397)
Total CK: 2010 U/L — ABNORMAL HIGH (ref 49–397)
Total CK: 1628 U/L — ABNORMAL HIGH (ref 49–397)

## 2017-09-09 LAB — BASIC METABOLIC PANEL WITH GFR
Anion gap: 8 (ref 5–15)
BUN: 10 mg/dL (ref 8–23)
CO2: 26 mmol/L (ref 22–32)
Calcium: 8.2 mg/dL — ABNORMAL LOW (ref 8.9–10.3)
Chloride: 102 mmol/L (ref 98–111)
Creatinine, Ser: 0.99 mg/dL (ref 0.61–1.24)
GFR calc Af Amer: >60 mL/min
GFR calc non Af Amer: >60 mL/min
Glucose, Bld: 113 mg/dL — ABNORMAL HIGH (ref 70–99)
Potassium: 2.7 mmol/L — CL (ref 3.5–5.1)
Sodium: 136 mmol/L (ref 135–145)

## 2017-09-09 LAB — POTASSIUM: Potassium: 3.4 mmol/L — ABNORMAL LOW (ref 3.5–5.1)

## 2017-09-09 LAB — CBC WITH DIFFERENTIAL/PLATELET
Abs Immature Granulocytes: 0 10*3/uL (ref 0.0–0.1)
Basophils Absolute: 0 10*3/uL (ref 0.0–0.1)
Basophils Relative: 0 %
EOS ABS: 0.2 10*3/uL (ref 0.0–0.7)
EOS PCT: 2 %
HEMATOCRIT: 32.7 % — AB (ref 39.0–52.0)
Hemoglobin: 11 g/dL — ABNORMAL LOW (ref 13.0–17.0)
Immature Granulocytes: 1 %
Lymphocytes Relative: 19 %
Lymphs Abs: 1.3 10*3/uL (ref 0.7–4.0)
MCH: 33.5 pg (ref 26.0–34.0)
MCHC: 33.6 g/dL (ref 30.0–36.0)
MCV: 99.7 fL (ref 78.0–100.0)
MONO ABS: 0.9 10*3/uL (ref 0.1–1.0)
MONOS PCT: 13 %
Neutro Abs: 4.5 10*3/uL (ref 1.7–7.7)
Neutrophils Relative %: 65 %
Platelets: 203 10*3/uL (ref 150–400)
RBC: 3.28 MIL/uL — AB (ref 4.22–5.81)
RDW: 12.6 % (ref 11.5–15.5)
WBC: 7 10*3/uL (ref 4.0–10.5)

## 2017-09-09 LAB — PHOSPHORUS: PHOSPHORUS: 3.5 mg/dL (ref 2.5–4.6)

## 2017-09-09 LAB — MAGNESIUM
MAGNESIUM: 1.2 mg/dL — AB (ref 1.7–2.4)
MAGNESIUM: 1.7 mg/dL (ref 1.7–2.4)

## 2017-09-09 MED ORDER — FAMOTIDINE 20 MG PO TABS
20.0000 mg | ORAL_TABLET | Freq: Every day | ORAL | Status: DC
  Administered 2017-09-10 (×2): 20 mg via ORAL
  Filled 2017-09-09 (×2): qty 1

## 2017-09-09 MED ORDER — MAGNESIUM SULFATE 2 GM/50ML IV SOLN
2.0000 g | Freq: Once | INTRAVENOUS | Status: AC
  Administered 2017-09-09: 2 g via INTRAVENOUS
  Filled 2017-09-09: qty 50

## 2017-09-09 MED ORDER — METOPROLOL TARTRATE 25 MG PO TABS
12.5000 mg | ORAL_TABLET | Freq: Two times a day (BID) | ORAL | Status: DC
  Administered 2017-09-10 (×2): 12.5 mg via ORAL
  Filled 2017-09-09 (×2): qty 1

## 2017-09-09 MED ORDER — FOLIC ACID 1 MG PO TABS
1.0000 mg | ORAL_TABLET | Freq: Every day | ORAL | Status: DC
  Administered 2017-09-09 – 2017-09-10 (×2): 1 mg via ORAL
  Filled 2017-09-09 (×2): qty 1

## 2017-09-09 MED ORDER — DILTIAZEM HCL 60 MG PO TABS
30.0000 mg | ORAL_TABLET | Freq: Four times a day (QID) | ORAL | Status: DC
  Administered 2017-09-09 – 2017-09-10 (×6): 30 mg via ORAL
  Filled 2017-09-09 (×6): qty 1

## 2017-09-09 MED ORDER — POTASSIUM CHLORIDE CRYS ER 20 MEQ PO TBCR
40.0000 meq | EXTENDED_RELEASE_TABLET | Freq: Four times a day (QID) | ORAL | Status: AC
  Administered 2017-09-09 (×2): 40 meq via ORAL
  Filled 2017-09-09 (×2): qty 2

## 2017-09-09 MED ORDER — VITAMIN B-1 100 MG PO TABS
100.0000 mg | ORAL_TABLET | Freq: Every day | ORAL | Status: DC
  Administered 2017-09-09 – 2017-09-10 (×2): 100 mg via ORAL
  Filled 2017-09-09 (×2): qty 1

## 2017-09-09 MED ORDER — ADULT MULTIVITAMIN W/MINERALS CH
1.0000 | ORAL_TABLET | Freq: Every day | ORAL | Status: DC
  Administered 2017-09-09 – 2017-09-10 (×2): 1 via ORAL
  Filled 2017-09-09 (×2): qty 1

## 2017-09-09 MED ORDER — LEVETIRACETAM 500 MG PO TABS
500.0000 mg | ORAL_TABLET | Freq: Two times a day (BID) | ORAL | Status: DC
  Administered 2017-09-09 – 2017-09-10 (×2): 500 mg via ORAL
  Filled 2017-09-09 (×2): qty 1

## 2017-09-09 NOTE — Progress Notes (Signed)
eLink Physician-Brief Progress Note Patient Name: Steven Byrd DOB: 06-30-1955 MRN: [id number]   Date of Service  09/09/2017  HPI/Events of Note  Request to change IV Pepcid, Keppra and Metoprolol to PO.  eICU Interventions  Will order: 1. Pepcid, Keppra and Metoprolol all changed to PO.      Intervention Category Major Interventions: Other:  Lysle Dingwall 09/09/2017, 9:23 PM

## 2017-09-09 NOTE — Progress Notes (Signed)
RT Note:  Patient has home CPAP equipment at bedside. Chamber has distilled water and patient is able to place himself on/off as needed. No assistance needed at this time.

## 2017-09-09 NOTE — Progress Notes (Signed)
PULMONARY / CRITICAL CARE MEDICINE   Name: Steven Byrd MRN: [id number] DOB: 1955-04-17    ADMISSION DATE:  09/04/2017 CONSULTATION DATE: 09/04/2017  REFERRING MD:  Vickie Epley  CHIEF COMPLAINT:  S/P SDH evacuation  HISTORY OF PRESENT ILLNESS:       62 year old male with PMH significant for atrial flutter s/p ablation 03/22/2017 and since off anticoagulation and ASA, HLD, and OSA on CPAP who presented with seizures.  Apparently patient [place] that required stitches.  Was found actively seizing on ground this morning by family followed by several witnessed seizures.  No prior history of seizures.  Was able to slowly follow some commands mostly with his right upper arm in ER.  Found to on CT head to have large right sided subdural hematoma and therefore taken emergently to OR for craniotomy.       He subsequently suffered from overt DTs with his delirium primarily controlled with Precedex.  He went into atrial fibrillation.      This morning he is entirely alert and appropriately interactive off Precedex.  He is in atrial fibrillation and is on a Cardizem infusion for rate control at 15 mg/h.  He is remarkably free of complaint.   PAST MEDICAL HISTORY :  He  has a past medical history of Atrial flutter ([place]), Dyslipidemia, and OSA on CPAP (01/20/2014).  PAST SURGICAL HISTORY: He  has a past surgical history that includes No past surgeries; A-FLUTTER ABLATION (N/A, 03/22/2017); and Craniotomy (Right, 09/04/2017).  No Known Allergies  No current facility-administered medications on file prior to encounter.    Current Outpatient Medications on File Prior to Encounter  Medication Sig  . CALCIUM-MAGNESIUM-ZINC PO Take 1 capsule by mouth daily.  . cholecalciferol (VITAMIN D) 1000 units tablet Take 1,000 Units by mouth daily.  . Coenzyme Q10 (CO Q 10 PO) Take 1 capsule by mouth daily.  [name] latanoprost (XALATAN) 0.005 % ophthalmic solution Place 1 drop  into both eyes every evening.  . Multiple Vitamin (MULTIVITAMIN) capsule Take 1 capsule by mouth daily.  . simvastatin (ZOCOR) 40 MG tablet TAKE 1 TABLET BY MOUTH AT BEDTIME  . TURMERIC PO Take 1 tablet by mouth daily.  [name] acyclovir (ZOVIRAX) 800 MG tablet Take 1 tablet (800 mg total) by mouth 3 (three) times daily as needed. (Patient not taking: Reported on 09/04/2017)    FAMILY HISTORY:  His indicated that the status of his mother is unknown. He indicated that the status of his father is unknown. He indicated that the status of his maternal grandfather is unknown. He indicated that the status of his neg hx is unknown.   SOCIAL HISTORY: He  reports that he quit smoking about 18 years ago. He has never used smokeless tobacco. He reports that he drinks about 8.4 - 12.6 oz of alcohol per week. He reports that he does not use drugs.  REVIEW OF SYSTEMS:   Not obtainable at present SUBJECTIVE:  As above  VITAL SIGNS: BP 119/78   Pulse 67   Temp 98.4 F (36.9 C) (Oral)   Resp 19   Ht 6\' 1"  (1.854 m)   Wt 182 lb (82.6 kg)   SpO2 97%   BMI 24.01 kg/m   HEMODYNAMICS:    VENTILATOR SETTINGS:    INTAKE / OUTPUT: I/O last 3 completed shifts: In: 2433.9 [P.O.:150; I.V.:2049.7; IV Piggyback:234.3] Out: -   PHYSICAL EXAMINATION: General: Calmly and appropriately interactive this morning reporting no  further hallucinations  Neuro: Oriented x3 this morning.  Much better strength on left side, able to raise the left leg off the bed and raise his left arm over his head.     HEENT: Incision is dry Cardiovascular: S1 and S2 are irregularly irregular without murmur rub or gallop, there is no JVD.     Lungs: Respirations are unlabored, there is symmetric air movement, no wheezes, no dependent rales.     Abdomen: The abdomen is soft without any organomegaly masses or tenderness.  He is anicteric.   Musculoskeletal: There is no dependent edema the limbs are warm   LABS:  BMET Recent Labs   Lab 09/06/17 0406 09/07/17 0334 09/09/17 0425  NA 142 141 136  K 4.5 3.6 2.7*  CL 111 110 102  CO2 22 23 26   BUN 37* 25* 10  CREATININE 1.73* 1.37* 0.99  GLUCOSE 125* 116* 113*    Electrolytes Recent Labs  Lab 09/04/17 2013 09/04/17 2020  09/06/17 0406 09/07/17 0334 09/09/17 0425  CALCIUM 7.6* 7.7*   < > 8.3* 8.6* 8.2*  MG 2.3  --   --   --   --  1.2*  PHOS  --  5.7*  --   --   --  3.5   < > = values in this interval not displayed.    CBC Recent Labs  Lab 09/05/17 0405 09/07/17 0334 09/09/17 0425  WBC 8.6 9.0 7.0  HGB 10.5* 11.3* 11.0*  HCT 32.3* 33.9* 32.7*  PLT 148* 189 203    Coag's Recent Labs  Lab 09/04/17 1510  INR 1.04    Sepsis Markers Recent Labs  Lab 09/04/17 1536 09/04/17 2012 09/04/17 2013 09/05/17 0405  LATICACIDVEN 12.66* 0.9  --  1.0  PROCALCITON  --   --  35.75  --     ABG Recent Labs  Lab 09/04/17 2045 09/05/17 0540  PHART 7.302* 7.357  PCO2ART 43.1 33.5  PO2ART 397.0* 124*    Liver Enzymes Recent Labs  Lab 09/04/17 1510 09/04/17 2020  AST 134*  --   ALT 71*  --   ALKPHOS 76  --   BILITOT 2.0*  --   ALBUMIN 4.3 3.5    Cardiac Enzymes No results for input(s): TROPONINI, PROBNP in the last 168 hours.  Glucose Recent Labs  Lab 09/07/17 0718 09/07/17 1106 09/07/17 1543 09/07/17 2015 09/07/17 2316 09/08/17 0736  GLUCAP 117* 100* 128* 95 102* 91    Imaging No results found.   STUDIES:    CULTURES: Urine culture is pending, procalcitonin is elevated to 31  ANTIBIOTICS: Zosyn   SIGNIFICANT EVENTS: Craniotomy on 7/2  LINES/TUBES:   DISCUSSION:      This is a 62 year old status post craniotomy for right subdural hematoma.  Current issues include a remarkably elevated procalcitonin on admission for which she was transiently treated for aspiration, history of OSA, history of seizures, and a history of intermittent atrial flutter.  He is suffered from the DTs and is now in atrial  fibrillation  ASSESSMENT / PLAN:  PULMONARY A: Antibiotics for aspiration of been discontinued for 24 hours now with no unfavorable clinical evolution.    CARDIOVASCULAR A: He is back in atrial fibrillation this morning and on a combination of both Lopressor and diltiazem for rate control.  He is overtly not a candidate for anticoagulation with his subdural hematoma.  I am going to be converting his Cardizem to p.o. for now anticipating that his delirium clears along with the  stress of surgery his rate will be easier to control.     RENAL A: [place] is normalized.   GASTROINTESTINAL A: Prophylaxis is with Pepcid   HEMATOLOGIC A: SCDs alone for prophylaxis due to CNS bleeding  INFECTIOUS A: No growth in his urine  to account for the elevated procalcitonin on presentation.  There was a question of aspiration on his chest x-ray.  Zosyn was discontinued on 7/6     ENDOCRINE A: No known issues     NEUROLOGIC A: Day 4 status post evacuation of right subdural hematoma.  Delirium has largely resolved and he has had no further seizure activity.  Continue thiamine multivitamins, continues Keppra.  Left Sided strength is remarkably improved.    Lars Masson, MD [place] Pager: [phone]  09/09/2017, 7:57 AM

## 2017-09-09 NOTE — Progress Notes (Addendum)
  NEUROSURGERY PROGRESS NOTE   No issues overnight. No complaints this am. Able to walk with PT yesterday. Episode of Afib with RVR this am.  EXAM:  BP 114/89   Pulse 64   Temp 98.9 F (37.2 C) (Oral)   Resp 12   Ht 6\' 1"  (1.854 m)   Wt 82.6 kg (182 lb)   SpO2 98%   BMI 24.01 kg/m   Awake, alert, oriented  Speech fluent, appropriate  CN grossly intact  5/5 BUE/BLE  Wound c/d/i  IMPRESSION:  62 y.o. male s/p right crani for SDH, doing well Afib w RVR  PLAN: - Cont to mobilize with PT/OT - Transition from IV cardizem to PO per CCM

## 2017-09-09 NOTE — Progress Notes (Signed)
[facility] Progress Note Patient Name: Steven Byrd DOB: 1955-04-02 MRN: [id number]   Date of Service  09/09/2017  HPI/Events of Note  K+ = 2.7 and Creatinine = 0.99.  eICU Interventions  Will replace K+.      Intervention Category Major Interventions: Electrolyte abnormality - evaluation and management  [name],Steven Eugene 09/09/2017, 5:59 AM

## 2017-09-10 LAB — CULTURE, BLOOD (ROUTINE X 2)
CULTURE: NO GROWTH
Culture: NO GROWTH
SPECIAL REQUESTS: ADEQUATE
Special Requests: ADEQUATE

## 2017-09-10 LAB — BASIC METABOLIC PANEL
ANION GAP: 5 (ref 5–15)
BUN: 13 mg/dL (ref 8–23)
CALCIUM: 8.3 mg/dL — AB (ref 8.9–10.3)
CO2: 27 mmol/L (ref 22–32)
CREATININE: 1.02 mg/dL (ref 0.61–1.24)
Chloride: 105 mmol/L (ref 98–111)
GFR calc Af Amer: >60 mL/min (ref >60)
GFR calc non Af Amer: >60 mL/min (ref >60)
GLUCOSE: 103 mg/dL — AB (ref 70–99)
Potassium: 3.6 mmol/L (ref 3.5–5.1)
Sodium: 137 mmol/L (ref 135–145)

## 2017-09-10 LAB — MAGNESIUM: Magnesium: 1.5 mg/dL — ABNORMAL LOW (ref 1.7–2.4)

## 2017-09-10 LAB — CK: CK TOTAL: 1028 U/L — AB (ref 49–397)

## 2017-09-10 MED ORDER — DILTIAZEM HCL 30 MG PO TABS: 30.0000 mg | ORAL_TABLET | Freq: Four times a day (QID) | ORAL | 0 refills | Status: DC

## 2017-09-10 MED ORDER — LEVETIRACETAM 500 MG PO TABS: 500.0000 mg | ORAL_TABLET | Freq: Two times a day (BID) | ORAL | 2 refills | Status: DC

## 2017-09-10 NOTE — Discharge Summary (Signed)
Physician Discharge Summary  Patient ID: Steven Byrd MRN: [id number] DOB/AGE: 07/06/1955 62 y.o.  Admit date: 09/04/2017 Discharge date: 09/10/2017  Admission Diagnoses:Subdural hematoma with seizure  Discharge Diagnoses: Same with Atrial Fibrillation Principal Problem:   Subacute subdural hematoma (HCC) Active Problems:   Dyslipidemia   OSA on CPAP   Status post surgery   Seizure (HCC)   Left hemiparesis ([place])   Bacteriuria with pyuria   Discharged Condition: good  Hospital Course: Patient was taken to OR on emergent basis for right craniotomy for SDH.  He did well after surgery, had no more seizures, regained consciousness.  He did have episode of A Fib with RVR which resolved with IV cardizem and continued corrected on po cardizem.  Consults: pulmonary/intensive care  Significant Diagnostic Studies: Head ct  Treatments: surgery: Patient was taken to OR on emergent basis for right craniotomy for SDH  Discharge Exam: Blood pressure (!) 137/91, pulse 69, temperature 99.6 F (37.6 C), temperature source Oral, resp. rate 16, height 6\' 1"  (1.854 m), weight 82.6 kg (182 lb), SpO2 98 %. Neurologic: Alert and oriented X 3, normal strength and tone. Normal symmetric reflexes. Normal coordination and gait Wound:cdi  Disposition: hOME  Discharge Instructions    Diet - low sodium heart healthy   Complete by:  As directed    Increase activity slowly   Complete by:  As directed      Allergies as of 09/10/2017   No Known Allergies     Medication List    TAKE these medications   acyclovir 800 MG tablet Commonly known as:  ZOVIRAX Take 1 tablet (800 mg total) by mouth 3 (three) times daily as needed.   CALCIUM-MAGNESIUM-ZINC PO Take 1 capsule by mouth daily.   cholecalciferol 1000 units tablet Commonly known as:  VITAMIN D Take 1,000 Units by mouth daily.   CO Q 10 PO Take 1 capsule by mouth daily.   diltiazem 30 MG tablet Commonly known as:  CARDIZEM Take 1  tablet (30 mg total) by mouth every 6 (six) hours.   latanoprost 0.005 % ophthalmic solution Commonly known as:  XALATAN Place 1 drop into both eyes every evening.   levETIRAcetam 500 MG tablet Commonly known as:  KEPPRA Take 1 tablet (500 mg total) by mouth 2 (two) times daily.   multivitamin capsule Take 1 capsule by mouth daily.   simvastatin 40 MG tablet Commonly known as:  ZOCOR TAKE 1 TABLET BY MOUTH AT BEDTIME   TURMERIC PO Take 1 tablet by mouth daily.        Signed: Peggyann Shoals, MD 09/10/2017, 8:26 AM

## 2017-09-10 NOTE — Plan of Care (Signed)
Pt discharged at 1145 to home. Discharge teaching performed with patient and sister, who both verbalized understanding. Assessment stable upon discharge. Wheeled off unit by [employer].

## 2017-09-10 NOTE — Progress Notes (Signed)
Subjective: Patient reports doing well  Objective: Vital signs in last 24 hours: Temp:  [98.1 F (36.7 C)-99.6 F (37.6 C)] 99.6 F (37.6 C) (07/08 0800) Pulse Rate:  [60-99] 69 (07/08 0800) Resp:  [9-29] 16 (07/08 0800) BP: (100-137)/(71-92) 137/91 (07/08 0800) SpO2:  [94 %-100 %] 98 % (07/08 0800)  Intake/Output from previous day: 07/07 0701 - 07/08 0700 In: 508.8 [I.V.:359; IV Piggyback:149.8] Out: -  Intake/Output this shift: Total I/O In: 25 [I.V.:25] Out: -   Physical Exam: Dressing CDI.  No HA, No A Fib, No seizures, no drift.  Lab Results: Recent Labs    09/09/17 0425  WBC 7.0  HGB 11.0*  HCT 32.7*  PLT 203   BMET Recent Labs    09/09/17 0425 09/09/17 1455 09/10/17 0415  NA 136  --  137  K 2.7* 3.4* 3.6  CL 102  --  105  CO2 26  --  27  GLUCOSE 113*  --  103*  BUN 10  --  13  CREATININE 0.99  --  1.02  CALCIUM 8.2*  --  8.3*    Studies/Results: No results found.  Assessment/Plan: Patient is doing well.  Discharge home on po cardizem.  F/U 8 days for staples.  FF/U with Dr. Rayann Heman as outpatient.  Continue on Keppra.  No driving.    LOS: 6 days    Peggyann Shoals, MD 09/10/2017, 8:22 AM

## 2017-09-10 NOTE — Progress Notes (Signed)
Physical Therapy Treatment Patient Details Name: Steven Byrd MRN: [id number] DOB: 1956-02-23 Today's Date: 09/10/2017    History of Present Illness pt is a 62 y/o male with h/o aflutter and OSA, admitted after being found by a friend on the ground actively seizing, CT showed R SDH s/p crani for evacuation.    PT Comments    Much improved.  Overall, more steady, more focused, more safety conscious.  Friends and family will be available.    Follow Up Recommendations  Other (comment);No PT follow up     Equipment Recommendations  None recommended by PT    Recommendations for Other Services       Precautions / Restrictions Precautions Precautions: Fall(minimal)    Mobility  Bed Mobility Overal bed mobility: Modified Independent                Transfers Overall transfer level: Modified independent                  Ambulation/Gait Ambulation/Gait assistance: Supervision;Independent Gait Distance (Feet): 300 Feet Assistive device: None Gait Pattern/deviations: Step-through pattern Gait velocity: able to reach age appropriate speeds. Gait velocity interpretation: >2.62 ft/sec, indicative of community ambulatory General Gait Details: pt showed ability to be safe and independent in a homelike environment.  He was challenge with balance tasks and managed well without LOB or deviation.   Stairs Stairs: Yes Stairs assistance: Modified independent (Device/Increase time) Stair Management: No rails;One rail Left;Alternating pattern;Forwards Number of Stairs: 12 General stair comments: safe with rails   Wheelchair Mobility    Modified Rankin (Stroke Patients Only) Modified Rankin (Stroke Patients Only) Pre-Morbid Rankin Score: No symptoms Modified Rankin: Slight disability     Balance     Sitting balance-Leahy Scale: Good       Standing balance-Leahy Scale: Good                   Standardized Balance Assessment Standardized Balance  Assessment : Dynamic Gait Index   Dynamic Gait Index Level Surface: Normal Change in Gait Speed: Normal Gait with Horizontal Head Turns: Normal Gait with Vertical Head Turns: Normal Gait and Pivot Turn: Normal Step Over Obstacle: Normal Step Around Obstacles: Normal Steps: Mild Impairment Total Score: 23      Cognition Arousal/Alertness: Awake/alert Behavior During Therapy: WFL for tasks assessed/performed Overall Cognitive Status: Within Functional Limits for tasks assessed(showing ability to make better/safe decisions.)                                        Exercises      General Comments General comments (skin integrity, edema, etc.): pt will have someone around for at least a week.      Pertinent Vitals/Pain Pain Assessment: No/denies pain    Home Living                      Prior Function            PT Goals (current goals can now be found in the care plan section) Acute Rehab PT Goals Patient Stated Goal: home independent PT Goal Formulation: With patient Time For Goal Achievement: 09/15/17 Potential to Achieve Goals: Good Progress towards PT goals: Progressing toward goals    Frequency    Min 3X/week      PT Plan Current plan remains appropriate    Co-evaluation  AM-PAC PT "6 Clicks" Daily Activity  Outcome Measure  Difficulty turning over in bed (including adjusting bedclothes, sheets and blankets)?: None Difficulty moving from lying on back to sitting on the side of the bed? : None Difficulty sitting down on and standing up from a chair with arms (e.g., wheelchair, bedside commode, etc,.)?: None Help needed moving to and from a bed to chair (including a wheelchair)?: None Help needed walking in hospital room?: None Help needed climbing 3-5 steps with a railing? : A Little 6 Click Score: 23    End of Session   Activity Tolerance: Patient tolerated treatment well Patient left: in bed;with call  bell/phone within reach;with family/visitor present Nurse Communication: Mobility status PT Visit Diagnosis: Other abnormalities of gait and mobility (R26.89)     Time: 6629-4765 PT Time Calculation (min) (ACUTE ONLY): 15 min  Charges:  $Gait Training: 8-22 mins                    G Codes:       10/10/2017  Donnella Sham, PT [phone]  (pager)   Tessie Fass Calyn Rubi 10-Oct-2017, 1:41 PM

## 2017-09-11 ENCOUNTER — Emergency Department (HOSPITAL_COMMUNITY)
Admission: EM | Admit: 2017-09-11 | Discharge: 2017-09-11 | Disposition: A | Discharge status: Home / Self Care | Payer: BLUE CROSS/BLUE SHIELD | Attending: Emergency Medicine | Admitting: Emergency Medicine

## 2017-09-11 ENCOUNTER — Encounter (HOSPITAL_COMMUNITY): Payer: Self-pay

## 2017-09-11 ENCOUNTER — Emergency Department (HOSPITAL_COMMUNITY): Payer: BLUE CROSS/BLUE SHIELD

## 2017-09-11 DIAGNOSIS — R4701 Aphasia: Secondary | ICD-10-CM | POA: Diagnosis not present

## 2017-09-11 DIAGNOSIS — Z79899 Other long term (current) drug therapy: Secondary | ICD-10-CM | POA: Diagnosis not present

## 2017-09-11 DIAGNOSIS — G459 Transient cerebral ischemic attack, unspecified: Secondary | ICD-10-CM | POA: Diagnosis not present

## 2017-09-11 DIAGNOSIS — Z87891 Personal history of nicotine dependence: Secondary | ICD-10-CM | POA: Insufficient documentation

## 2017-09-11 DIAGNOSIS — Z8679 Personal history of other diseases of the circulatory system: Secondary | ICD-10-CM | POA: Diagnosis not present

## 2017-09-11 DIAGNOSIS — R479 Unspecified speech disturbances: Secondary | ICD-10-CM | POA: Diagnosis not present

## 2017-09-11 LAB — COMPREHENSIVE METABOLIC PANEL
ALBUMIN: 3.2 g/dL — AB (ref 3.5–5.0)
ALT: 248 U/L — ABNORMAL HIGH (ref 0–44)
ANION GAP: 11 (ref 5–15)
AST: 79 U/L — AB (ref 15–41)
Alkaline Phosphatase: 62 U/L (ref 38–126)
BUN: 20 mg/dL (ref 8–23)
CO2: 24 mmol/L (ref 22–32)
Calcium: 9.3 mg/dL (ref 8.9–10.3)
Chloride: 101 mmol/L (ref 98–111)
Creatinine, Ser: 1.22 mg/dL (ref 0.61–1.24)
GFR calc Af Amer: >60 mL/min (ref >60)
GFR calc non Af Amer: >60 mL/min (ref >60)
GLUCOSE: 141 mg/dL — AB (ref 70–99)
POTASSIUM: 3.4 mmol/L — AB (ref 3.5–5.1)
SODIUM: 136 mmol/L (ref 135–145)
Total Bilirubin: 0.9 mg/dL (ref 0.3–1.2)
Total Protein: 6.8 g/dL (ref 6.5–8.1)

## 2017-09-11 LAB — DIFFERENTIAL
Abs Immature Granulocytes: 0.1 10*3/uL (ref 0.0–0.1)
Basophils Absolute: 0 10*3/uL (ref 0.0–0.1)
Basophils Relative: 0 %
EOS PCT: 3 %
Eosinophils Absolute: 0.2 10*3/uL (ref 0.0–0.7)
IMMATURE GRANULOCYTES: 1 %
Lymphocytes Relative: 18 %
Lymphs Abs: 1.4 10*3/uL (ref 0.7–4.0)
Monocytes Absolute: 1 10*3/uL (ref 0.1–1.0)
Monocytes Relative: 13 %
NEUTROS PCT: 65 %
Neutro Abs: 5.1 10*3/uL (ref 1.7–7.7)

## 2017-09-11 LAB — CBC
HCT: 35.4 % — ABNORMAL LOW (ref 39.0–52.0)
Hemoglobin: 11.7 g/dL — ABNORMAL LOW (ref 13.0–17.0)
MCH: 33.9 pg (ref 26.0–34.0)
MCHC: 33.1 g/dL (ref 30.0–36.0)
MCV: 102.6 fL — ABNORMAL HIGH (ref 78.0–100.0)
Platelets: 286 10*3/uL (ref 150–400)
RBC: 3.45 MIL/uL — AB (ref 4.22–5.81)
RDW: 12.7 % (ref 11.5–15.5)
WBC: 7.7 10*3/uL (ref 4.0–10.5)

## 2017-09-11 LAB — I-STAT CHEM 8, ED
BUN: 22 mg/dL (ref 8–23)
CHLORIDE: 100 mmol/L (ref 98–111)
CREATININE: 1.1 mg/dL (ref 0.61–1.24)
Calcium, Ion: 1.19 mmol/L (ref 1.15–1.40)
Glucose, Bld: 139 mg/dL — ABNORMAL HIGH (ref 70–99)
HEMATOCRIT: 35 % — AB (ref 39.0–52.0)
Hemoglobin: 11.9 g/dL — ABNORMAL LOW (ref 13.0–17.0)
Potassium: 3.4 mmol/L — ABNORMAL LOW (ref 3.5–5.1)
SODIUM: 138 mmol/L (ref 135–145)
TCO2: 23 mmol/L (ref 22–32)

## 2017-09-11 LAB — APTT: aPTT: 29 seconds (ref 24–36)

## 2017-09-11 LAB — I-STAT TROPONIN, ED: Troponin i, poc: 0.04 ng/mL (ref 0.00–0.08)

## 2017-09-11 LAB — PROTIME-INR
INR: 1
Prothrombin Time: 13.1 seconds (ref 11.4–15.2)

## 2017-09-11 MED ORDER — LEVETIRACETAM 750 MG PO TABS: 750.0000 mg | ORAL_TABLET | Freq: Two times a day (BID) | ORAL | 0 refills | Status: DC

## 2017-09-11 NOTE — ED Provider Notes (Signed)
Patient placed in Quick Look pathway, seen and evaluated   Chief Complaint: dysphagia  HPI:   Recent brain surgery for ?subdural hematoma by DR. Vertell Limber on July 2nd.  Had a brief 5 min episode of difficulty formulating words.  Sxs resolved.    ROS: no headache, no focal numbness (one)  Physical Exam:   Gen: No distress  Neuro: Awake and Alert  Skin: Warm    Focused Exam: VAN negative.     Initiation of care has begun. The patient has been counseled on the process, plan, and necessity for staying for the completion/evaluation, and the remainder of the medical screening examination    Steven Byrd 09/11/17 1930    Noemi Chapel, MD 09/12/17 2228

## 2017-09-11 NOTE — Discharge Instructions (Addendum)
Please take your new increased dose of Keppra, 750 mg twice a day. Please call Dr. Vertell Limber tomorrow to schedule an appointment regarding her visit today. Please return to the emergency department for any new or worsening symptoms.  Get help right away if: You develop symptoms of subdural hematoma. You are taking blood thinners and you fall or you experience minor trauma to the head. If you take any blood thinners, even a very small injury can cause a subdural hematoma. You should get help right away, even if you think your symptoms are mild. You have a bleeding disorder and you fall or you experience minor trauma to the head. You experience a head injury and you develop any of the following symptoms: Clear fluid draining from your nose or ears. Nausea. Vomiting. Slurred speech. Seizures. Drowsiness or a decrease in alertness. Double vision. Fever Numbness or inability to move (paralysis) in any part of your body. Difficulty walking or poor coordination. Difficulty thinking. Confusion or forgetfulness. Personality changes.

## 2017-09-11 NOTE — ED Triage Notes (Signed)
Pt has a subdural hematoma and had surgery on the 2nd, today had episode for about 5 seconds where he could not get his words out, neuro intact bilaterally, no deficits or slurred speech noted.

## 2017-09-11 NOTE — ED Provider Notes (Signed)
[facility] EMERGENCY DEPARTMENT Provider Note   CSN: [id number] Arrival date & time: 09/11/17  1910     History   Chief Complaint Chief Complaint  Patient presents with  . Post-op Problem    HPI Steven Byrd is a 62 y.o. male presenting for 5-minute episode of difficulty speaking approximately 6 PM this afternoon.  Patient states that during this time he was able to understand speech but was just having trouble forming words.  Family states that patient returned to baseline after this episode and has had no new symptoms since.  Patient was discharged yesterday following a craniotomy for a subdural hematoma diagnosed on July 2.  Craniotomy performed by Dr. Vertell Limber.  Patient and family state that he has had no neuro deficits following his subdural hematoma and craniotomy and was doing well following his discharge yesterday.  Per family patient has returned to baseline.  Denies history of fevers, nausea, vomiting, headache.  HPI  Past Medical History:  Diagnosis Date  . Atrial flutter ([place])   . Dyslipidemia   . OSA on CPAP 01/20/2014    Patient Active Problem List   Diagnosis Date Noted  . Bacteriuria with pyuria 09/05/2017  . Status post surgery 09/04/2017  . Subacute subdural hematoma ([place]) 09/04/2017  . Seizure ([place]) 09/04/2017  . Left hemiparesis ([place]) 09/04/2017  . Travel advice encounter 11/27/2014  . OSA on CPAP 01/20/2014  . Snoring 10/15/2013  . Witnessed apneic spells 10/15/2013  . Typical atrial flutter ([place]) 09/29/2013  . Erectile dysfunction 01/18/2012  . Benign fasciculations 01/18/2012  . Dyslipidemia 01/07/2007  . 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 A-FLUTTER ABLATION N/A 03/22/2017   Procedure: A-FLUTTER ABLATION;  Surgeon: Thompson Grayer, MD;  Location: [facility];  Service: Cardiovascular;  Laterality: N/A;  . CRANIOTOMY Right 09/04/2017   Procedure: CRANIOTOMY FOR SUBDURAL HEMATOMA;  Surgeon: Erline Levine, MD;  Location: [facility];  Service: Neurosurgery;  Laterality: Right;  . NO PAST SURGERIES          Home Medications    Prior to Admission medications   Medication Sig Start Date End Date Taking? Authorizing Provider  CALCIUM-MAGNESIUM-ZINC PO Take 1 capsule by mouth daily.   Yes [provider]  cholecalciferol (VITAMIN D) 1000 units tablet Take 1,000 Units by mouth daily.   Yes [provider]  Coenzyme Q10 (CO Q 10 PO) Take 1 capsule by mouth daily.   Yes [provider]  diltiazem (CARDIZEM) 30 MG tablet Take 1 tablet (30 mg total) by mouth every 6 (six) hours. 09/10/17  Yes Erline Levine, MD  latanoprost (XALATAN) 0.005 % ophthalmic solution Place 1 drop into both eyes every evening. 09/26/15  Yes [provider]  Multiple Vitamin (MULTIVITAMIN) capsule Take 1 capsule by mouth daily.   Yes [provider]  simvastatin (ZOCOR) 40 MG tablet TAKE 1 TABLET BY MOUTH AT BEDTIME 03/26/17  Yes Dorena Cookey, MD  TURMERIC PO Take 1 tablet by mouth daily.   Yes [provider]  acyclovir (ZOVIRAX) 800 MG tablet Take 1 tablet (800 mg total) by mouth 3 (three) times daily as needed. Patient not taking: Reported on 09/04/2017 07/20/17   Dorothyann Peng, NP  levETIRAcetam (KEPPRA) 750 MG tablet Take 1 tablet (750 mg total) by mouth 2 (two) times daily. 09/11/17   Deliah Boston, PA-C    Family History Family History  Problem Relation  Age of Onset  . Diabetes Father   . Stroke Father   . Hypertension Mother        ?  [name] Heart attack Maternal Grandfather   . Colon cancer Neg Hx     Social History Social History   Tobacco Use  . Smoking status: Former Smoker    Last attempt to quit: 10/16/1998    Years since quitting: 18.9  . Smokeless tobacco: Never Used  Substance Use Topics  . Alcohol use: Yes    Alcohol/week: 8.4 - 12.6 oz    Types: 14 - 21 Glasses of wine per week      Comment: red wine  . Drug use: No     Allergies   Patient has no known allergies.   Review of Systems Review of Systems  Constitutional: Negative.  Negative for chills and fever.  HENT: Negative for trouble swallowing.   Eyes: Negative.  Negative for visual disturbance.  Respiratory: Negative.  Negative for cough, chest tightness and shortness of breath.   Cardiovascular: Negative.  Negative for chest pain and leg swelling.  Gastrointestinal: Negative.  Negative for abdominal pain, diarrhea, nausea and vomiting.  Genitourinary: Negative.  Negative for dysuria, flank pain and hematuria.  Musculoskeletal: Negative.  Negative for gait problem.  Skin: Negative.  Negative for rash.  Neurological: Positive for speech difficulty. Negative for dizziness, syncope, facial asymmetry, weakness, light-headedness, numbness and headaches.     Physical Exam Updated Vital Signs BP (!) 135/95 (BP Location: Left Arm)   Pulse 70   Temp 98.8 F (37.1 C) (Oral)   Resp 16   SpO2 99%   Physical Exam  Constitutional: He is oriented to person, place, and time. He appears well-developed and well-nourished. No distress.  HENT:  Patient with large craniotomy site to the right side of his head.  Healing well, no warmth, erythema, streaking or drainage noted.  Eyes: Pupils are equal, round, and reactive to light. EOM are normal.  Neck: Normal range of motion. Neck supple. No tracheal deviation present.  Cardiovascular: Normal rate and regular rhythm.  Pulmonary/Chest: Effort normal and breath sounds normal. No respiratory distress.  Abdominal: Soft. Bowel sounds are normal. There is no tenderness. There is no rebound and no guarding.  Musculoskeletal: Normal range of motion.  Neurological: He is alert and oriented to person, place, and time. He has normal strength. No cranial nerve deficit or sensory deficit.  Mental Status:  Alert, oriented, thought content appropriate, able to give a coherent  history. Speech fluent without evidence of aphasia. Able to follow 2 step commands without difficulty.  Cranial Nerves:  II:  Peripheral visual fields grossly normal, pupils equal, round, reactive to light III,IV, VI: ptosis not present, extra-ocular motions intact bilaterally  V,VII: smile symmetric, eyebrows raise symmetric, facial light touch sensation equal VIII: hearing grossly normal to voice  X: uvula elevates symmetrically  XI: bilateral shoulder shrug symmetric and strong XII: midline tongue extension without fassiculations Motor:  Normal tone. 5/5 in upper and lower extremities bilaterally including strong and equal grip strength and dorsiflexion/plantar flexion Sensory: Sensation intact to light touch in all extremities. Patient ambulating well in the hallway, denies dizziness or weakness. Cerebellar: normal finger-to-nose with bilateral upper extremities. Normal heel-to -shin balance bilaterally of the lower extremity. No pronator drift.   Skin: Skin is warm and dry.  Psychiatric: He has a normal mood and affect. His behavior is normal.     ED Treatments / Results  Labs (all labs ordered  are listed, but only abnormal results are displayed) Labs Reviewed  CBC - Abnormal; Notable for the following components:      Result Value   RBC 3.45 (*)    Hemoglobin 11.7 (*)    HCT 35.4 (*)    MCV 102.6 (*)    All other components within normal limits  COMPREHENSIVE METABOLIC PANEL - Abnormal; Notable for the following components:   Potassium 3.4 (*)    Glucose, Bld 141 (*)    Albumin 3.2 (*)    AST 79 (*)    ALT 248 (*)    All other components within normal limits  I-STAT CHEM 8, ED - Abnormal; Notable for the following components:   Potassium 3.4 (*)    Glucose, Bld 139 (*)    Hemoglobin 11.9 (*)    HCT 35.0 (*)    All other components within normal limits  PROTIME-INR  APTT  DIFFERENTIAL  I-STAT TROPONIN, ED  CBG MONITORING, ED    EKG EKG  Interpretation  Date/Time:  Tuesday September 11 2017 19:31:42 EDT Ventricular Rate:  73 PR Interval:  130 QRS Duration: 84 QT Interval:  374 QTC Calculation: 412 R Axis:   34 Text Interpretation:  Normal sinus rhythm T wave abnormality, consider inferior ischemia Abnormal ECG since last tracing no significant change Confirmed by Noemi Chapel [id number]) on 09/11/2017 10:05:19 PM   Radiology Ct Head Wo Contrast  Result Date: 09/11/2017 CLINICAL DATA:  Status post right craniotomy 09/04/2017 for evacuation of right subdural hematoma. TIA with resolved speech difficulty. EXAM: CT HEAD WITHOUT CONTRAST TECHNIQUE: Contiguous axial images were obtained from the base of the skull through the vertex without intravenous contrast. COMPARISON:  09/06/2017 head CT. FINDINGS: Brain: Interval removal of right-sided surgical drain. Decreasing right extra-axial space gas. Subacute right cerebral convexity subdural hematoma measuring up to 6 mm diameter, previously 7 mm, slightly decreased in size, decreased in density. Subacute left frontal convexity subdural hematoma measuring up to 5 mm diameter, previously 5 mm, stable, decreased in density. No evidence of parenchymal hemorrhage. No mass lesion or midline shift. Stable to decreased sulcal effacement on the right. No CT evidence of acute infarction. Cerebral volume is age appropriate. No ventriculomegaly. Vascular: No acute abnormality. Skull: No evidence of calvarial fracture. Stable postsurgical changes from right pterional craniotomy. Sinuses/Orbits: The visualized paranasal sinuses are essentially clear. Other:  The mastoid air cells are unopacified. IMPRESSION: 1. Right subdural hematoma is slightly decreased in size and decreased in density. Decreased right extra-axial space postsurgical gas. 2. Left frontal subdural hematoma is stable in size and decreased in density. 3. No acute interval intracranial hemorrhage.  No hydrocephalus. 4. No midline shift. Sulcal  effacement on the right is stable to decreased. Electronically Signed   By: Ilona Sorrel M.D.   On: 09/11/2017 20:24    Procedures Procedures (including critical care time)  Medications Ordered in ED Medications - No data to display   Initial Impression / Assessment and Plan / ED Course  I have reviewed the triage vital signs and the nursing notes.  Pertinent labs & imaging results that were available during my care of the patient were reviewed by me and considered in my medical decision making (see chart for details).   Patient with approximate 5-minute episode of difficulty speaking today at 6 PM.  Patient status post craniotomy for subdural hematoma with discharge yesterday.  Neurosurgery consult to Dr. Vertell Limber has been called by Dr. Sabra Heck. Dr. Vertell Limber has asked that we increase the  patient's Keppra due to possibility of this being a focal seizure.  Dr. Vertell Limber states that he will follow-up with the patient in his office and that patient can be discharged.  Patient's Keppra dose has been increased to 750 mg twice daily.  Patient and family have been informed to follow-up with Dr. Vertell Limber and call his office tomorrow morning.   At this time there does not appear to be any evidence of an acute emergency medical condition and the patient appears stable for discharge with appropriate outpatient follow up.  Normal neuro exam without deficits.  Plan of care was discussed with patient who verbalizes understanding and is agreeable to discharge. I have discussed return precautions with patient and family who verbalize understanding of return precautions. Patient strongly encouraged to follow-up with their PCP.  Patient's case discussed with Dr. Sabra Heck who agrees with plan to discharge with follow-up.     This note was dictated using DragonOne dictation software; please contact for any inconsistencies within the note.    Final Clinical Impressions(s) / ED Diagnoses   Final diagnoses:  Aphasia     ED Discharge Orders        Ordered    levETIRAcetam (KEPPRA) 750 MG tablet  2 times daily     09/11/17 2247       Gari Crown 09/12/17 Lynnell Catalan    Noemi Chapel, MD 09/12/17 2229

## 2017-09-12 ENCOUNTER — Ambulatory Visit: Payer: Self-pay | Admitting: *Deleted

## 2017-09-12 ENCOUNTER — Telehealth: Payer: Self-pay | Admitting: Adult Health

## 2017-09-12 NOTE — Telephone Encounter (Signed)
Pt's girlfriend called (pt there given permission) with diarrhea since being in the hospital and still having it. He was on IV antibiotics in the hospital but not on any now. He denies fever, cramping or feeling dehydrated. They think probably the antibiotics may be causing this. Per protocol, an appointment is scheduled for tomorrow.  Home care advice given with verbal understanding. Will route to flow at [facility] at [place]. Advised to call back for increase or worsening symptoms.  Reason for Disposition . [1] Recent hospitalization AND [2] diarrhea present > 3 days  Answer Assessment - Initial Assessment Questions 1. DIARRHEA SEVERITY: "How bad is the diarrhea?" "How many extra stools have you had in the past 24 hours than normal?"    - NO DIARRHEA (SCALE 0)   - MILD (SCALE 1-3): Few loose or mushy BMs; increase of 1-3 stools over normal daily number of stools; mild increase in ostomy output.   -  MODERATE (SCALE 4-7): Increase of 4-6 stools daily over normal; moderate increase in ostomy output. * SEVERE (SCALE 8-10; OR 'WORST POSSIBLE'): Increase of 7 or more stools daily over normal; moderate increase in ostomy output; incontinence.     moderate 2. ONSET: "When did the diarrhea begin?"      While in the hospital 3. BM CONSISTENCY: "How loose or watery is the diarrhea?"      loose 4. VOMITING: "Are you also vomiting?" If so, ask: "How many times in the past 24 hours?"      No n/v 5. ABDOMINAL PAIN: "Are you having any abdominal pain?" If yes: "What does it feel like?" (e.g., crampy, dull, intermittent, constant)      No cramping 6. ABDOMINAL PAIN SEVERITY: If present, ask: "How bad is the pain?"  (e.g., Scale 1-10; mild, moderate, or severe)   - MILD (1-3): doesn't interfere with normal activities, abdomen soft and not tender to touch    - MODERATE (4-7): interferes with normal activities or awakens from sleep, tender to touch    - SEVERE (8-10): excruciating pain, doubled over, unable  to do any normal activities       none 7. ORAL INTAKE: If vomiting, "Have you been able to drink liquids?" "How much fluids have you had in the past 24 hours?"     3 waters so far and 2 cups of coffee 8. HYDRATION: "Any signs of dehydration?" (e.g., dry mouth [not just dry lips], too weak to stand, dizziness, new weight loss) "When did you last urinate?"     no 9. EXPOSURE: "Have you traveled to a foreign country recently?" "Have you been exposed to anyone with diarrhea?" "Could you have eaten any food that was spoiled?"     No, been antibiotics while in the hospitals 10. ANTIBIOTIC USE: "Are you taking antibiotics now or have you taken antibiotics in the past 2 months?"       yes 11. OTHER SYMPTOMS: "Do you have any other symptoms?" (e.g., fever, blood in stool)       no  Protocols used: DIARRHEA-A-AH

## 2017-09-12 NOTE — Telephone Encounter (Signed)
Pt requested to have DPR to sign at his next office visit and to put his sister, Onnie Boer and his girlfriend, Marcy Panning on it.  Advised him that until he signs it, we will still have to get permission to talk with anyone regarding his health.

## 2017-09-12 NOTE — Telephone Encounter (Signed)
Noted  

## 2017-09-12 NOTE — Telephone Encounter (Signed)
Will send to [facility] as FYI 

## 2017-09-13 ENCOUNTER — Encounter: Payer: Self-pay | Admitting: Family Medicine

## 2017-09-13 ENCOUNTER — Ambulatory Visit (INDEPENDENT_AMBULATORY_CARE_PROVIDER_SITE_OTHER): Payer: BLUE CROSS/BLUE SHIELD | Admitting: Family Medicine

## 2017-09-13 VITALS — BP 116/72 | Temp 98.9°F | Ht 73.0 in | Wt 180.2 lb

## 2017-09-13 DIAGNOSIS — R21 Rash and other nonspecific skin eruption: Secondary | ICD-10-CM

## 2017-09-13 DIAGNOSIS — S065X9A Traumatic subdural hemorrhage with loss of consciousness of unspecified duration, initial encounter: Secondary | ICD-10-CM

## 2017-09-13 DIAGNOSIS — R569 Unspecified convulsions: Secondary | ICD-10-CM

## 2017-09-13 DIAGNOSIS — S065XAA Traumatic subdural hemorrhage with loss of consciousness status unknown, initial encounter: Secondary | ICD-10-CM

## 2017-09-13 DIAGNOSIS — R197 Diarrhea, unspecified: Secondary | ICD-10-CM | POA: Diagnosis not present

## 2017-09-13 NOTE — Progress Notes (Signed)
   Subjective:    Patient ID: Steven Byrd, male    DOB: February 03, 1956, 62 y.o.   MRN: [id number]  HPI Here for a hospital follow up and to address several issues. He was hospitalized from 09-04-17 to 09-10-17 when he was found to be having a generalized seizure. This was triggered by a subdural hematoma, and the hemorrhage was the result of a fall he had at home 3 weeks prior to that. He had a craniotomy per Dr. Erline Levine, and he was started on Keppra 500 mg bid for seizure prophylaxis. Then on 09-11-17 he had an episode of speech difficulty and he returned to the ER. This resolved on its own after 5 minutes. A non-contrasted head CT was unremarkable. It was unclear if this was from a TIA or from a seizure. His Keppra dose was increased and then it was increased again, so now he is taking 1000 mg bid. He says he developed some light diarrhea 3 days ago and yesterday he took a single Imodium OTC. The diarrhea has stopped and he has passed 2 normal appearing solid stools in the past 24 hours. No cramps or nausea or fever. He asks about being tested for C difficile. Also he has had an itchy rash on his back for the past week. Now it seems to be drying up and to be going away. They have applied Aveeno lotion at home.    Review of Systems  Constitutional: Negative.   Respiratory: Negative.   Cardiovascular: Negative.   Gastrointestinal: Negative.   Skin: Positive for rash.  Neurological: Negative.        Objective:   Physical Exam  Constitutional: He is oriented to person, place, and time. He appears well-developed and well-nourished.  HENT:  Right craniotomy site looks clean   Cardiovascular: Normal rate, regular rhythm, normal heart sounds and intact distal pulses.  Pulmonary/Chest: Effort normal and breath sounds normal.  Abdominal: Soft. Bowel sounds are normal. He exhibits no distension and no mass. There is no tenderness. There is no rebound and no guarding.  Neurological: He is alert and  oriented to person, place, and time.  Skin:  There is a diffuse maculopapular rash over the entire back. Many of the papules are scabbed over and they seem to be drying up           Assessment & Plan:  He is recovering from a craniotomy for a subdural hematoma that resulted from a fall. He had a generalized seizure and possibly another brief episode of seizure activity, so he is taking Keppra for that. He had a few days of diarrhea, possibly the effects of IV antibiotics, but this is resolving on its own. I told him we cannot test for C diff because this requires a liquid stool sample. He has a folliculitis rash on the back which appears to be resolving on its own. He will follow up with Dr. Vertell Limber as scheduled.  Alysia Penna, MD

## 2017-09-17 ENCOUNTER — Encounter: Payer: Self-pay | Admitting: Neurology

## 2017-09-17 ENCOUNTER — Other Ambulatory Visit: Payer: Self-pay

## 2017-09-17 ENCOUNTER — Ambulatory Visit (INDEPENDENT_AMBULATORY_CARE_PROVIDER_SITE_OTHER): Payer: BLUE CROSS/BLUE SHIELD | Admitting: Neurology

## 2017-09-17 VITALS — BP 132/76 | HR 73 | Ht 73.0 in | Wt 176.0 lb

## 2017-09-17 DIAGNOSIS — R569 Unspecified convulsions: Secondary | ICD-10-CM | POA: Diagnosis not present

## 2017-09-17 DIAGNOSIS — S065X9A Traumatic subdural hemorrhage with loss of consciousness of unspecified duration, initial encounter: Secondary | ICD-10-CM | POA: Diagnosis not present

## 2017-09-17 DIAGNOSIS — S065XAA Traumatic subdural hemorrhage with loss of consciousness status unknown, initial encounter: Secondary | ICD-10-CM

## 2017-09-17 MED ORDER — OXCARBAZEPINE 300 MG PO TABS: ORAL_TABLET | ORAL | 6 refills | Status: DC

## 2017-09-17 NOTE — Progress Notes (Signed)
NEUROLOGY CONSULTATION NOTE  Steven Byrd MRN: [id number] DOB: [birth year]  Referring provider: Dr. Erline Levine Primary care provider: Dorothyann Peng, NP  Reason for consult:  seizures  Dear Dr Vertell Limber:  Thank you for your kind referral of Steven Byrd for consultation of the above symptoms. Although his history is well known to you, please allow me to reiterate it for the purpose of our medical record. The patient was accompanied to the clinic by his significant other who also provides collateral information. Records and images were personally reviewed where available.  HISTORY OF PRESENT ILLNESS: This is a pleasant 62 year old man with a history of hyperlipidemia, atrial flutter s/p ablation, presenting for new onset seizures that occurred in the setting of a subdural hematoma. He had fallen last May 2019 then 2 weeks later his friend came to his house and no one was answering the door. They broke through the door and found him under his desk face down convulsing until EMS arrived. There was note on admission that he had several other witnessed seizures and received several doses of Versed. He was brought the [facility] where head CT showed an acute mixed density subdural hematoma overlying the right cerebral convexity up to 38mm, with additional small acute subdural hemorrhage overlying the left frontal convexity up to 75mm. There was extension along the falx and left tentorium with blood seen at the foramen magnum, mass effect with 21mm right to left shift. He underwent emergent right craniotomy for evacuation. He initially had left-sided weakness with significant improvement on hospital discharge on 09/10/17. He was back in the ER the next day after an episode of sudden inability to get his words out. This lasted 4-5 minutes. He was aware throughout and could think of the word but could not say it. No focal symptoms, no associated headache. Repeat head CT showed slightly decreased right  subdural hematoma size, post-surgical changes, stable left frontal subdural hematoma, no midline shift. Keppra dose was increased to 1000mg  BID. Since then, he has had several more similar episode of transient aphasia, he had one on Friday, 2 on Saturday, then one last Sunday. He would be visibly upset because he cannot articulate his words. He has been more tired on the higher dose of Keppra, and his significant other is concerned about an itchy rash on his back and abdomen.   He and his significant other deny any staring/unresponsive episodes, no gaps in time, olfactory/gustatory hallucinations, deja vu, rising epigastric sensation, focal numbness/tingling/weakness, myoclonic jerks.[name]   Epilepsy Risk Factors:  Bilateral subdural hematoma s/p fall R>L, s/p right craniotomy. Otherwise he had a normal birth and early development.  There is no history of febrile convulsions, CNS infections such as meningitis/encephalitis.   PAST MEDICAL HISTORY: Past Medical History:  Diagnosis Date  . Atrial flutter ([place])   . Dyslipidemia   . OSA on CPAP 01/20/2014    PAST SURGICAL HISTORY: Past Surgical History:  Procedure Laterality Date  . A-FLUTTER ABLATION N/A 03/22/2017   Procedure: A-FLUTTER ABLATION;  Surgeon: Thompson Grayer, MD;  Location: [facility];  Service: Cardiovascular;  Laterality: N/A;  . CRANIOTOMY Right 09/04/2017   Procedure: CRANIOTOMY FOR SUBDURAL HEMATOMA;  Surgeon: Erline Levine, MD;  Location: [facility];  Service: Neurosurgery;  Laterality: Right;  . NO PAST SURGERIES      MEDICATIONS: Current Outpatient Medications on File Prior to Visit  Medication Sig Dispense Refill  . acyclovir (ZOVIRAX) 800 MG tablet Take 1 tablet (800 mg total)  by mouth 3 (three) times daily as needed. 30 tablet 0  . CALCIUM-MAGNESIUM-ZINC PO Take 1 capsule by mouth daily.    . cholecalciferol (VITAMIN D) 1000 units tablet Take 1,000 Units by mouth daily.    . Coenzyme Q10 (CO Q 10 PO) Take 1 capsule by  mouth daily.    [name] diltiazem (CARDIZEM) 30 MG tablet Take 1 tablet (30 mg total) by mouth every 6 (six) hours. 60 tablet 0  . latanoprost (XALATAN) 0.005 % ophthalmic solution Place 1 drop into both eyes every evening.  4  . levETIRAcetam (KEPPRA) 1000 MG tablet Take 1,000 mg by mouth 2 (two) times daily.    . Multiple Vitamin (MULTIVITAMIN) capsule Take 1 capsule by mouth daily.    . Probiotic Product (PROBIOTIC-10) CAPS Take by mouth.    . simvastatin (ZOCOR) 40 MG tablet TAKE 1 TABLET BY MOUTH AT BEDTIME 90 tablet 3   No current facility-administered medications on file prior to visit.     ALLERGIES: Allergies  Allergen Reactions  . Ativan [Lorazepam]     irritable and agitated    FAMILY HISTORY: Family History  Problem Relation Age of Onset  . Diabetes Father   . Stroke Father   . Hypertension Mother        ?  [name] Heart attack Maternal Grandfather   . Colon cancer Neg Hx     SOCIAL HISTORY: Social History   Socioeconomic History  . Marital status: Divorced    Spouse name: Not on file  . Number of children: Not on file  . Years of education: Not on file  . Highest education level: Not on file  Occupational History  . Not on file  Social Needs  . Financial resource strain: Not on file  . Food insecurity:    Worry: Not on file    Inability: Not on file  . Transportation needs:    Medical: Not on file    Non-medical: Not on file  Tobacco Use  . Smoking status: Former Smoker    Last attempt to quit: 10/16/1998    Years since quitting: 18.9  . Smokeless tobacco: Never Used  Substance and Sexual Activity  . Alcohol use: Yes    Alcohol/week: 8.4 - 12.6 oz    Types: 14 - 21 Glasses of wine per week    Comment: red wine  . Drug use: No  . Sexual activity: Not on file  Lifestyle  . Physical activity:    Days per week: Not on file    Minutes per session: Not on file  . Stress: Not on file  Relationships  . Social connections:    Talks on phone: Not on file     Gets together: Not on file    Attends religious service: Not on file    Active member of club or organization: Not on file    Attends meetings of clubs or organizations: Not on file    Relationship status: Not on file  . Intimate partner violence:    Fear of current or ex partner: Not on file    Emotionally abused: Not on file    Physically abused: Not on file    Forced sexual activity: Not on file  Other Topics Concern  . Not on file  Social History Narrative   Has a Licensed conveyancer company since 1978      Daughter who is 23 and a Son who is 62   - Daughter in [place], Son is  in [place].       He likes to hike, scuba dive, and travel    REVIEW OF SYSTEMS: Constitutional: No fevers, chills, or sweats, no generalized fatigue, change in appetite Eyes: No visual changes, double vision, eye pain Ear, nose and throat: No hearing loss, ear pain, nasal congestion, sore throat Cardiovascular: No chest pain, palpitations Respiratory:  No shortness of breath at rest or with exertion, wheezes GastrointestinaI: No nausea, vomiting, diarrhea, abdominal pain, fecal incontinence Genitourinary:  No dysuria, urinary retention or frequency Musculoskeletal:  No neck pain, back pain Integumentary: No rash, pruritus, skin lesions Neurological: as above Psychiatric: No depression, insomnia, anxiety Endocrine: No palpitations, fatigue, diaphoresis, mood swings, change in appetite, change in weight, increased thirst Hematologic/Lymphatic:  No anemia, purpura, petechiae. Allergic/Immunologic: no itchy/runny eyes, nasal congestion, recent allergic reactions,+ rashes  PHYSICAL EXAM: Vitals:   09/17/17 1312  BP: 132/76  Pulse: 73  SpO2: 98%   General: No acute distress Head:  Normocephalic, well-healing right craniotomy incision Eyes: Fundoscopic exam shows bilateral sharp discs, no vessel changes, exudates, or hemorrhages Neck: supple, no paraspinal tenderness, full range of motion Back: No  paraspinal tenderness Heart: regular rate and rhythm Lungs: Clear to auscultation bilaterally. Vascular: No carotid bruits. Skin/Extremities: No rash, no edema. He has old-appearing papules in his back, newer ones with abrasions from scratching on his abdomen Neurological Exam: Mental status: alert and oriented to person, place, and time, no dysarthria or aphasia, Fund of knowledge is appropriate.  Recent and remote memory are intact. 2/3 delayed recall.  Attention and concentration are normal, 4/5 WORLD backward.  Able to name objects and repeat phrases. Cranial nerves: CN I: not tested CN II: pupils equal, round and reactive to light, visual fields intact, fundi unremarkable. CN III, IV, VI:  full range of motion, no nystagmus, no ptosis CN V: facial sensation intact CN VII: upper and lower face symmetric CN VIII: hearing intact to finger rub CN IX, X: gag intact, uvula midline CN XI: sternocleidomastoid and trapezius muscles intact CN XII: tongue midline Bulk & Tone: normal, no fasciculations. Motor: 5/5 throughout with no pronator drift. Sensation: intact to light touch, cold, pin, vibration and joint position sense.  No extinction to double simultaneous stimulation.  Romberg test negative Deep Tendon Reflexes: +2 throughout, no ankle clonus Plantar responses: downgoing bilaterally Cerebellar: no incoordination on finger to nose, heel to shin. No dysdiadochokinesia Gait: narrow-based and steady, able to tandem walk adequately. Tremor: none  IMPRESSION: This is a pleasant 62 year old right-handed man with a history of history of hyperlipidemia, atrial flutter s/p ablation, presenting for new onset seizures that occurred in the setting of a subdural hematoma last 09/04/17. He underwent right craniotomy, most recent imaging on 09/11/17 showed smaller right subdural, stable left frontal subdural. He has had recurrent transient episodes of aphasia since 09/11/17, concerning for simple partial  seizures arising from the left side. MRI brain with and without contrast and a 1-hour EEG will be ordered. He is having fatigue and possibly a rash on the Keppra. We agreed to make a quick transition to Trileptal and wean off Keppra after 9 days of starting Trileptal. Side effects of Trileptal discussed. [place] driving laws were discussed with the patient, and he knows to stop driving after a seizure, until 6 months seizure-free. He will follow-up in 3 months and knows to call for any changes.   Thank you for allowing me to participate in the care of this patient. Please do not hesitate to  call for any questions or concerns.   Ellouise Newer, M.D.  CC: Dr. Vertell Limber, Dorothyann Peng, NP

## 2017-09-17 NOTE — Patient Instructions (Addendum)
1. Schedule MRI brain with and without contrast  We have sent a referral to [facility] for your MRI and they will call you directly to schedule your appt. They are located at [address]. If you need to contact them directly please call [phone].    2. Schedule 1-hour EEG  3. Start Oxcarbazepine (Trileptal) 300mg : take 1/2 tablet twice a day for 3 days, then increase to 1 tablet twice a day for 3 days, then increase to 2 tablets twice a day and continue  4. Continue Keppra 1000mg  twice a day for now. After 3 days of taking her dose Trileptal 600mg  twice a day dose, start reducing Keppra to 1/2 tablet twice a day for 1 week, then 1/2 tablet once a day for a week, then stop  5. Contact our office for any change in symptoms  6. Follow-up in 3 months, call for any changes  Seizure Precautions: 1. If medication has been prescribed for you to prevent seizures, take it exactly as directed.  Do not stop taking the medicine without talking to your doctor first, even if you have not had a seizure in a long time.   2. Avoid activities in which a seizure would cause danger to yourself or to others.  Don't operate dangerous machinery, swim alone, or climb in high or dangerous places, such as on ladders, roofs, or girders.  Do not drive unless your doctor says you may.  3. If you have any warning that you may have a seizure, lay down in a safe place where you can't hurt yourself.    4.  No driving for 6 months from last seizure, as per [facility].   Please refer to the following link on the [facility] website for more information: http://www.epilepsyfoundation.org/answerplace/Social/driving/drivingu.cfm   5.  Maintain good sleep hygiene. Avoid alcohol.  6.  Contact your doctor if you have any problems that may be related to the medicine you are taking.  7.  Call 911 and bring the patient back to the ED if:        A.  The seizure lasts longer than  5 minutes.       B.  The patient doesn't awaken shortly after the seizure  C.  The patient has new problems such as difficulty seeing, speaking or moving  D.  The patient was injured during the seizure  E.  The patient has a temperature over 102 F (39C)  F.  The patient vomited and now is having trouble breathing

## 2017-09-18 ENCOUNTER — Encounter: Payer: Self-pay | Admitting: Internal Medicine

## 2017-09-18 ENCOUNTER — Telehealth: Payer: Self-pay | Admitting: Internal Medicine

## 2017-09-18 NOTE — Telephone Encounter (Signed)
New Message   Pt c/o medication issue:  1. Name of Medication: diltiazem (CARDIZEM) 30 MG tablet  2. How are you currently taking this medication (dosage and times per day)? Take 1 tablet (30 mg total) by mouth every 6 (six) hours.  3. Are you having a reaction (difficulty breathing--STAT)? no  4. What is your medication issue? Pt's wife is calling, states that they would like a time release dosage so he doesn't have to take it every 6 hours. Please call

## 2017-09-19 ENCOUNTER — Ambulatory Visit (INDEPENDENT_AMBULATORY_CARE_PROVIDER_SITE_OTHER): Payer: BLUE CROSS/BLUE SHIELD | Admitting: Neurology

## 2017-09-19 DIAGNOSIS — R569 Unspecified convulsions: Secondary | ICD-10-CM

## 2017-09-19 DIAGNOSIS — S065XAA Traumatic subdural hemorrhage with loss of consciousness status unknown, initial encounter: Secondary | ICD-10-CM

## 2017-09-19 DIAGNOSIS — S065X9A Traumatic subdural hemorrhage with loss of consciousness of unspecified duration, initial encounter: Secondary | ICD-10-CM

## 2017-09-19 MED ORDER — DILTIAZEM HCL ER COATED BEADS 120 MG PO TB24: 120.0000 mg | ORAL_TABLET | Freq: Every day | ORAL | 3 refills | Status: DC

## 2017-09-19 NOTE — Telephone Encounter (Signed)
Call returned to Pt's wife.  Advised per Dr. Debara Pickett ok to change to long acting diltiazem.  Order placed for cardizem LA 120 mg daily to [facility] on [address] per wife.  No further action needed.

## 2017-09-19 NOTE — Telephone Encounter (Signed)
Yes . Ok to change to cardizem LA 120 mg daily. Stop short-acting cardizem.  Dr. Lemmie Evens

## 2017-09-21 ENCOUNTER — Encounter: Payer: Self-pay | Admitting: Neurology

## 2017-09-23 ENCOUNTER — Ambulatory Visit
Admission: RE | Admit: 2017-09-23 | Discharge: 2017-09-23 | Disposition: A | Discharge status: Home / Self Care | Payer: BLUE CROSS/BLUE SHIELD | Source: Ambulatory Visit | Attending: Neurology | Admitting: Neurology

## 2017-09-23 DIAGNOSIS — R569 Unspecified convulsions: Secondary | ICD-10-CM

## 2017-09-23 DIAGNOSIS — I6200 Nontraumatic subdural hemorrhage, unspecified: Secondary | ICD-10-CM | POA: Diagnosis not present

## 2017-09-23 DIAGNOSIS — S065XAA Traumatic subdural hemorrhage with loss of consciousness status unknown, initial encounter: Secondary | ICD-10-CM

## 2017-09-23 DIAGNOSIS — S065X9A Traumatic subdural hemorrhage with loss of consciousness of unspecified duration, initial encounter: Secondary | ICD-10-CM

## 2017-09-23 MED ORDER — GADOBENATE DIMEGLUMINE 529 MG/ML IV SOLN
15.0000 mL | Freq: Once | INTRAVENOUS | Status: AC | PRN
  Administered 2017-09-23: 15 mL via INTRAVENOUS

## 2017-09-24 ENCOUNTER — Telehealth: Payer: Self-pay

## 2017-09-24 NOTE — Telephone Encounter (Signed)
-----   Message from Cameron Sprang, MD sent at 09/24/2017 11:31 AM EDT ----- Pls let patient know that the MRI brain looks good, there is continued improvement in the area of bleed on the right side, much smaller than prior scan. No further blood seen on the left side. The EEG did not show any seizure discharges, it showed post-surgical changes on the right side. How is he feeling, any further seizures with medication changes? Thanks

## 2017-09-24 NOTE — Telephone Encounter (Signed)
Tried to contact patient regarding his results but unable to get a hold of him.  Will sent mychart message.

## 2017-09-25 ENCOUNTER — Encounter: Payer: Self-pay | Admitting: Neurology

## 2017-09-25 NOTE — Procedures (Signed)
ELECTROENCEPHALOGRAM REPORT  Date of Study: 09/19/2017  Patient's Name: Steven Byrd MRN: [id number] Date of Birth: 19-Sep-1955  Referring Provider: Dr. Ellouise Newer  Clinical History: This is a 62 year old man with new onset seizure, found to have bilateral subdural hematomas, s/p right craniotomy. He had episodes of transient aphasia afterwards.  Medications: KEPPRA1000 MG tablet  Trileptal ZOVIRAX 800 MG tablet   CALCIUM-MAGNESIUM-ZINC PO  VITAMIN D 1000 units tablet   CO Q 10 PO   CARDIZEM 30 MG tablet   XALATAN 0.005 %  MULTIVITAMIN capsule  PROBIOTIC-10 CAPS  ZOCOR 40 MG tablet  Technical Summary: A multichannel digital 1-hour EEG recording measured by the international 10-20 system with electrodes applied with paste and impedances below 5000 ohms performed in our laboratory with EKG monitoring in an awake and asleep patient.  Hyperventilation and photic stimulation were not performed.  The digital EEG was referentially recorded, reformatted, and digitally filtered in a variety of bipolar and referential montages for optimal display.    Description: The patient is awake and asleep during the recording.  During maximal wakefulness, there is a symmetric, medium voltage 9 Hz posterior dominant rhythm that attenuates with eye opening.  There are intermittent bursts of focal 4-5 Hz theta slowing over the right hemisphere, no evolution in frequency or amplitude seen. Breach artifact over the right hemisphere is seen, with faster frequencies and higher voltage activity noted. During drowsiness and sleep, there is an increase in theta slowing of the background. Asymmetric vertex waves and sleep spindles were seen, higher voltage over the right hemisphere.  Hyperventilation and photic stimulation were not performed. .  There were no epileptiform discharges or electrographic seizures seen.    EKG lead was unremarkable.  Impression: This 1-hour awake and asleep EEG is abnormal  due to the presence of: 1. Occasional focal slowing over the right hemisphere 2. Breach artifact over the right hemisphere  Clinical Correlation of the above findings indicates focal cerebral dysfunction over the right hemisphere region suggestive of underlying structural or physiologic abnormality. Breach artifact is consistent with prior surgery in this region. The absence of epileptiform discharges does not exclude a clinical diagnosis of epilepsy. Clinical correlation is advised.   Ellouise Newer, M.D.

## 2017-10-01 NOTE — Progress Notes (Signed)
Electrophysiology Office Note Date: 10/03/2017  ID:  Steven Byrd March 29, 1955, MRN [id number]  PCP: Steven Peng, NP Primary Cardiologist: [facility] Electrophysiologist: Allred  CC: hospital follow up  Steven Byrd is a 62 y.o. male seen today for Dr Rayann Heman. He underwent flutter ablation 03/2017 and did well afterwards stopping all AVN blocking agents and [place].  On 09/11/17, he developed seizures and was taken to the ER where he was found to have a large subdural hematoma that required surgical evacuation. During admission, he developed paroxysmal atrial fibrillation with RVR and was started on Diltiazem 30mg  q6 hours (this was consolidated to once a day dosing after discharge).  He was asked to follow up today for further evaluation.  Since discharge, the patient reports doing reasonably well.  He asks about drinking wine again. He has not had recurrence of AF.   He denies chest pain, palpitations, dyspnea, PND, orthopnea, nausea, vomiting, dizziness, syncope, edema, weight gain, or early satiety.  Past Medical History:  Diagnosis Date  . Atrial flutter ([place])   . Dyslipidemia   . OSA on CPAP 01/20/2014  . Paroxysmal atrial fibrillation [facility])    Past Surgical History:  Procedure Laterality Date  . A-FLUTTER ABLATION N/A 03/22/2017   Procedure: A-FLUTTER ABLATION;  Surgeon: Thompson Grayer, MD;  Location: [facility];  Service: Cardiovascular;  Laterality: N/A;  . CRANIOTOMY Right 09/04/2017   Procedure: CRANIOTOMY FOR SUBDURAL HEMATOMA;  Surgeon: Erline Levine, MD;  Location: [facility];  Service: Neurosurgery;  Laterality: Right;  . NO PAST SURGERIES      Current Outpatient Medications  Medication Sig Dispense Refill  . acyclovir (ZOVIRAX) 800 MG tablet Take 1 tablet (800 mg total) by mouth 3 (three) times daily as needed. 30 tablet 0  . CALCIUM-MAGNESIUM-ZINC PO Take 1 capsule by mouth daily.    . cholecalciferol (VITAMIN D) 1000 units tablet Take 1,000 Units by mouth  daily.    . Coenzyme Q10 (CO Q 10 PO) Take 1 capsule by mouth daily.    [name] diltiazem (CARDIZEM LA) 120 MG 24 hr tablet Take 1 tablet (120 mg total) by mouth daily. 90 tablet 3  . latanoprost (XALATAN) 0.005 % ophthalmic solution Place 1 drop into both eyes every evening.  4  . levETIRAcetam (KEPPRA) 1000 MG tablet Take 1,000 mg by mouth 2 (two) times daily.    . Multiple Vitamin (MULTIVITAMIN) capsule Take 1 capsule by mouth daily.    . Oxcarbazepine (TRILEPTAL) 300 MG tablet take 1/2 tablet twice a day for 3 days, then increase to 1 tablet twice a day for 3 days, then increase to 2 tablets twice a day 120 tablet 6  . Probiotic Product (PROBIOTIC-10) CAPS Take by mouth.    . simvastatin (ZOCOR) 40 MG tablet TAKE 1 TABLET BY MOUTH AT BEDTIME 90 tablet 3   No current facility-administered medications for this visit.     Allergies:   Ativan [lorazepam]   Social History: Social History   Socioeconomic History  . Marital status: Divorced    Spouse name: Not on file  . Number of children: Not on file  . Years of education: Not on file  . Highest education level: Not on file  Occupational History  . Not on file  Social Needs  . Financial resource strain: Not on file  . Food insecurity:    Worry: Not on file    Inability: Not on file  . Transportation needs:    Medical: Not  on file    Non-medical: Not on file  Tobacco Use  . Smoking status: Former Smoker    Last attempt to quit: 10/16/1998    Years since quitting: 18.9  . Smokeless tobacco: Never Used  Substance and Sexual Activity  . Alcohol use: Yes    Alcohol/week: 8.4 - 12.6 oz    Types: 14 - 21 Glasses of wine per week    Comment: red wine  . Drug use: No  . Sexual activity: Not on file  Lifestyle  . Physical activity:    Days per week: Not on file    Minutes per session: Not on file  . Stress: Not on file  Relationships  . Social connections:    Talks on phone: Not on file    Gets together: Not on file    Attends  religious service: Not on file    Active member of club or organization: Not on file    Attends meetings of clubs or organizations: Not on file    Relationship status: Not on file  . Intimate partner violence:    Fear of current or ex partner: Not on file    Emotionally abused: Not on file    Physically abused: Not on file    Forced sexual activity: Not on file  Other Topics Concern  . Not on file  Social History Narrative   Has a Licensed conveyancer company since 1978      Daughter who is 78 and a Son who is 52   - Daughter in [place], Son is in [place].       He likes to hike, scuba dive, and travel      Lives in single story home    [name] - significant other   4 year degree    Family History: Family History  Problem Relation Age of Onset  . Diabetes Father   . Stroke Father   . Hypertension Mother        ?  [name] Heart attack Maternal Grandfather   . Colon cancer Neg Hx     Review of Systems: All other systems reviewed and are otherwise negative except as noted above.   Physical Exam: VS:  BP 112/80   Pulse 75   Ht 6\' 1"  (1.854 m)   Wt 183 lb 1.9 oz (83.1 kg)   SpO2 98%   BMI 24.16 kg/m  , BMI Body mass index is 24.16 kg/m. Wt Readings from Last 3 Encounters:  10/03/17 183 lb 1.9 oz (83.1 kg)  09/17/17 176 lb (79.8 kg)  09/13/17 180 lb 3.2 oz (81.7 kg)    GEN- The patient is well appearing, alert and oriented x 3 today.   HEENT: normocephalic, atraumatic; sclera clear, conjunctiva pink; hearing intact; oropharynx clear; neck supple  Lungs- Clear to ausculation bilaterally, normal work of breathing.  No wheezes, rales, rhonchi Heart- Regular rate and rhythm  GI- soft, non-tender, non-distended, bowel sounds present  Extremities- no clubbing, cyanosis, or edema  MS- no significant deformity or atrophy Skin- warm and dry, no rash or lesion  Psych- euthymic mood, full affect Neuro- strength and sensation are intact   EKG:  EKG is not ordered today.  Recent  Labs: 07/26/2017: TSH 1.41 09/10/2017: Magnesium 1.5 09/11/2017: ALT 248; BUN 22; Creatinine, Ser 1.10; Hemoglobin 11.9; Platelets 286; Potassium 3.4; Sodium 138    Other studies Reviewed: Additional studies/ records that were reviewed today include: Dr Debara Pickett and Dr Jackalyn Lombard office notes, hospital records  Assessment and Plan:  1.  Paroxysmal atrial fibrillation Identified during hospitalization for SDH requiring evacuation CHADS2VASC is 0  He is doing well with Diltiazem We discussed options of continuing long acting Diltiazem. Will plan to stop Diltiazem at end of August and monitor for recurrence of AF. He can use prn short acting Diltiazem   2.  Atrial flutter S/p ablation No recurrence  3.  OSA Compliance with CPAP encouraged   4.  SDH He had a fall 3 weeks prior to seizure onset Will need to keep this in mind as his CHADS2VASC score increases with age and weighing risks/benefits to [facility]    Current medicines are reviewed at length with the patient today.   The patient does not have concerns regarding his medicines.  The following changes were made today:  none  Labs/ tests ordered today include: none No orders of the defined types were placed in this encounter.    Disposition:   Follow up with Dr Debara Pickett 3 months      Signed, Chanetta Marshall, NP 10/03/2017 12:14 PM   [address] [place] [id number] [phone] (office) [phone] (fax)

## 2017-10-02 DIAGNOSIS — L814 Other melanin hyperpigmentation: Secondary | ICD-10-CM | POA: Diagnosis not present

## 2017-10-02 DIAGNOSIS — D1800 Hemangioma unspecified site: Secondary | ICD-10-CM | POA: Diagnosis not present

## 2017-10-02 DIAGNOSIS — L570 Actinic keratosis: Secondary | ICD-10-CM | POA: Diagnosis not present

## 2017-10-02 DIAGNOSIS — L821 Other seborrheic keratosis: Secondary | ICD-10-CM | POA: Diagnosis not present

## 2017-10-03 ENCOUNTER — Encounter: Payer: Self-pay | Admitting: Nurse Practitioner

## 2017-10-03 ENCOUNTER — Other Ambulatory Visit: Payer: Self-pay | Admitting: Internal Medicine

## 2017-10-03 ENCOUNTER — Ambulatory Visit (INDEPENDENT_AMBULATORY_CARE_PROVIDER_SITE_OTHER): Payer: BLUE CROSS/BLUE SHIELD | Admitting: Nurse Practitioner

## 2017-10-03 VITALS — BP 112/80 | HR 75 | Ht 73.0 in | Wt 183.1 lb

## 2017-10-03 DIAGNOSIS — Z9989 Dependence on other enabling machines and devices: Secondary | ICD-10-CM

## 2017-10-03 DIAGNOSIS — I480 Paroxysmal atrial fibrillation: Secondary | ICD-10-CM | POA: Diagnosis not present

## 2017-10-03 DIAGNOSIS — G4733 Obstructive sleep apnea (adult) (pediatric): Secondary | ICD-10-CM | POA: Diagnosis not present

## 2017-10-03 DIAGNOSIS — I483 Typical atrial flutter: Secondary | ICD-10-CM | POA: Diagnosis not present

## 2017-10-03 NOTE — Patient Instructions (Addendum)
Medication Instructions:   STOP TAKING DILTIAZEM THE END OF AUGUST  If you need a refill on your cardiac medications before your next appointment, please call your pharmacy.  Labwork: NONE ORDERED  TODAY   Testing/Procedures: NONE ORDERED  TODAY    Follow-Up:IN 3 MONTHS WITH DR HILTY    Any Other Special Instructions Will Be Listed Below (If Applicable).

## 2017-10-18 ENCOUNTER — Other Ambulatory Visit: Payer: Self-pay | Admitting: Neurology

## 2017-12-04 ENCOUNTER — Other Ambulatory Visit: Payer: Self-pay

## 2017-12-04 ENCOUNTER — Ambulatory Visit: Payer: BLUE CROSS/BLUE SHIELD | Admitting: Neurology

## 2017-12-04 ENCOUNTER — Encounter: Payer: Self-pay | Admitting: Neurology

## 2017-12-04 VITALS — BP 126/84 | HR 55 | Ht 73.0 in | Wt 198.0 lb

## 2017-12-04 DIAGNOSIS — S065X9A Traumatic subdural hemorrhage with loss of consciousness of unspecified duration, initial encounter: Secondary | ICD-10-CM

## 2017-12-04 DIAGNOSIS — S065XAA Traumatic subdural hemorrhage with loss of consciousness status unknown, initial encounter: Secondary | ICD-10-CM

## 2017-12-04 DIAGNOSIS — R561 Post traumatic seizures: Secondary | ICD-10-CM | POA: Diagnosis not present

## 2017-12-04 NOTE — Patient Instructions (Addendum)
1. Schedule head CT without contrast for early January, repeat 1-hour EEG in early January 2. Continue oxcarbazepine 300mg : take 1 tablet in AM, 2 tablets in PM. Keep a calendar of the dizziness to track down triggers 3. Follow-up after tests in January 2020, call for any changes   We have sent a referral to [facility] for your CT and they will call you directly to schedule your appt. They are located at [place]. If you need to contact them directly please call [phone].   Seizure Precautions: 1. If medication has been prescribed for you to prevent seizures, take it exactly as directed.  Do not stop taking the medicine without talking to your doctor first, even if you have not had a seizure in a long time.   2. Avoid activities in which a seizure would cause danger to yourself or to others.  Don't operate dangerous machinery, swim alone, or climb in high or dangerous places, such as on ladders, roofs, or girders.  Do not drive unless your doctor says you may.  3. If you have any warning that you may have a seizure, lay down in a safe place where you can't hurt yourself.    4.  No driving for 6 months from last seizure, as per [facility].   Please refer to the following link on the [facility] website for more information: http://[email or web address]/[email or web address]/drivingu.cfm   5.  Maintain good sleep hygiene. Avoid alcohol.  6.  Contact your doctor if you have any problems that may be related to the medicine you are taking.  7.  Call 911 and bring the patient back to the ED if:        A.  The seizure lasts longer than 5 minutes.       B.  The patient doesn't awaken shortly after the seizure  C.  The patient has new problems such as difficulty seeing, speaking or moving  D.  The patient was injured during the seizure  E.  The patient has a temperature over 102 F (39C)  F.  The patient vomited and now is having  trouble breathing

## 2017-12-04 NOTE — Progress Notes (Signed)
NEUROLOGY FOLLOW UP OFFICE NOTE  Steven Byrd [id number] March 07, 1955  HISTORY OF PRESENT ILLNESS: I had the pleasure of seeing Steven Byrd in follow-up in the neurology clinic on 12/04/2017. He is again accompanied by his significant other who helps supplement the history today. 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 He had an initial GTC on 09/04/17, then had recurrent episodes of transient aphasia. Symptoms resolved with initiation of oxcarbazepine, no further episodes since 09/19/17. Records and images were personally reviewed where available.  I personally reviewed MRI brain with and without contrast done 09/23/17 which showed continued improvement of right subdural hematoma s/p evacuation, small residual subacute clot, maximum thickness 87mm directly under flap, small right frontal subdural hygroma, no significant left subdural fluid. Mild atrophy accounts for prominence of extracerebral CSF space. He had a 1-hour EEG which showed occasional focal slowing over the right hemisphere, breach artifact related to surgery.   He reports doing well since his initial visit. No further episodes of aphasia since 09/19/17. He denies any gaps in time, focal numbness/tingling/weakness, headaches. His significant other denies any staring/unresponsive episodes. He has occasional dizziness, he noticed it when he stood up at the gym, but his significant other also reminds him of dizziness last night. It is unclear if dizziness is related to oxcarbazepine, he was instructed recently to reduce dose to 300mg  in AM, 600mg  in PM. No falls. He is "always twitchy."  History on Initial Assessment 09/17/2017: This is a pleasant 62 year old man with a history of hyperlipidemia, atrial flutter s/p ablation, presenting for new onset seizures that occurred in the setting of a subdural hematoma. He had fallen last May 2019 then 3 weeks later on 09/04/17, his friend came to his house  and no one was answering the door. They broke through the door and found him under his desk face down convulsing until EMS arrived. There was note on admission that he had several other witnessed seizures and received several doses of Versed. He was brought the [facility] where head CT showed an acute mixed density subdural hematoma overlying the right cerebral convexity up to 74mm, with additional small acute subdural hemorrhage overlying the left frontal convexity up to 19mm. There was extension along the falx and left tentorium with blood seen at the foramen magnum, mass effect with 21mm right to left shift. He underwent emergent right craniotomy for evacuation. He initially had left-sided weakness with significant improvement on hospital discharge on 09/10/17. He was back in the ER the next day after an episode of sudden inability to get his words out. This lasted 4-5 minutes. He was aware throughout and could think of the word but could not say it. No focal symptoms, no associated headache. Repeat head CT showed slightly decreased right subdural hematoma size, post-surgical changes, stable left frontal subdural hematoma, no midline shift. Keppra dose was increased to 1000mg  BID. Since then, he has had several more similar episode of transient aphasia, he had one on Friday, 2 on Saturday, then one last Sunday. He would be visibly upset because he cannot articulate his words. He has been more tired on the higher dose of Keppra, and his significant other is concerned about an itchy rash on his back and abdomen.   He and his significant other deny any staring/unresponsive episodes, no gaps in time, olfactory/gustatory hallucinations, deja vu, rising epigastric sensation, focal numbness/tingling/weakness, myoclonic jerks.[name]   Epilepsy Risk Factors:  Bilateral subdural hematoma s/p fall  R>L, s/p right craniotomy. Otherwise he had a normal birth and early development.  There is no history of febrile convulsions, CNS  infections such as meningitis/encephalitis.  PAST MEDICAL HISTORY: Past Medical History:  Diagnosis Date  . Atrial flutter ([place])   . Dyslipidemia   . OSA on CPAP 01/20/2014  . Paroxysmal atrial fibrillation [facility])     MEDICATIONS: Current Outpatient Medications on File Prior to Visit  Medication Sig Dispense Refill  . acyclovir (ZOVIRAX) 800 MG tablet Take 1 tablet (800 mg total) by mouth 3 (three) times daily as needed. 30 tablet 0  . CALCIUM-MAGNESIUM-ZINC PO Take 1 capsule by mouth daily.    . cholecalciferol (VITAMIN D) 1000 units tablet Take 1,000 Units by mouth daily.    . Coenzyme Q10 (CO Q 10 PO) Take 1 capsule by mouth daily.    [name] diltiazem (CARDIZEM LA) 120 MG 24 hr tablet Take 1 tablet (120 mg total) by mouth daily. 90 tablet 3  . latanoprost (XALATAN) 0.005 % ophthalmic solution Place 1 drop into both eyes every evening.  4  . levETIRAcetam (KEPPRA) 1000 MG tablet Take 1,000 mg by mouth 2 (two) times daily.    . Multiple Vitamin (MULTIVITAMIN) capsule Take 1 capsule by mouth daily.    . Oxcarbazepine (TRILEPTAL) 300 MG tablet take 1/2 tablet twice a day for 3 days, then increase to 1 tablet twice a day for 3 days, then increase to 2 tablets twice a day 120 tablet 6  . Probiotic Product (PROBIOTIC-10) CAPS Take by mouth.    . simvastatin (ZOCOR) 40 MG tablet TAKE 1 TABLET BY MOUTH AT BEDTIME 90 tablet 3   No current facility-administered medications on file prior to visit.     ALLERGIES: Allergies  Allergen Reactions  . Ativan [Lorazepam]     irritable and agitated    FAMILY HISTORY: Family History  Problem Relation Age of Onset  . Diabetes Father   . Stroke Father   . Hypertension Mother        ?  [name] Heart attack Maternal Grandfather   . Colon cancer Neg Hx     SOCIAL HISTORY: Social History   Socioeconomic History  . Marital status: Divorced    Spouse name: Not on file  . Number of children: Not on file  . Years of education: Not on file  . Highest  education level: Not on file  Occupational History  . Not on file  Social Needs  . Financial resource strain: Not on file  . Food insecurity:    Worry: Not on file    Inability: Not on file  . Transportation needs:    Medical: Not on file    Non-medical: Not on file  Tobacco Use  . Smoking status: Former Smoker    Last attempt to quit: 10/16/1998    Years since quitting: 19.1  . Smokeless tobacco: Never Used  Substance and Sexual Activity  . Alcohol use: Yes    Alcohol/week: 14.0 - 21.0 standard drinks    Types: 14 - 21 Glasses of wine per week    Comment: red wine  . Drug use: No  . Sexual activity: Not on file  Lifestyle  . Physical activity:    Days per week: Not on file    Minutes per session: Not on file  . Stress: Not on file  Relationships  . Social connections:    Talks on phone: Not on file    Gets together: Not on file  Attends religious service: Not on file    Active member of club or organization: Not on file    Attends meetings of clubs or organizations: Not on file    Relationship status: Not on file  . Intimate partner violence:    Fear of current or ex partner: Not on file    Emotionally abused: Not on file    Physically abused: Not on file    Forced sexual activity: Not on file  Other Topics Concern  . 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       He likes to hike, scuba dive, and travel      Lives in single story home    [place] - significant other   4 year degree    REVIEW OF SYSTEMS: Constitutional: No fevers, chills, or sweats, no generalized fatigue, change in appetite Eyes: No visual changes, double vision, eye pain Ear, nose and throat: No hearing loss, ear pain, nasal congestion, sore throat Cardiovascular: No chest pain, palpitations Respiratory:  No shortness of breath at rest or with exertion,  wheezes GastrointestinaI: No nausea, vomiting, diarrhea, abdominal pain, fecal incontinence Genitourinary:  No dysuria, urinary retention or frequency Musculoskeletal:  No neck pain, back pain Integumentary: No rash, pruritus, skin lesions Neurological: as above Psychiatric: No depression, insomnia, anxiety Endocrine: No palpitations, fatigue, diaphoresis, mood swings, change in appetite, change in weight, increased thirst Hematologic/Lymphatic:  No anemia, purpura, petechiae. Allergic/Immunologic: no itchy/runny eyes, nasal congestion, recent allergic reactions, rashes  PHYSICAL EXAM: Vitals:   12/04/17 1458  BP: 126/84  Pulse: (!) 55  SpO2: 97%   General: No acute distress Head:  Normocephalic/atraumatic Neck: supple, no paraspinal tenderness, full range of motion Heart:  Regular rate and rhythm Lungs:  Clear to auscultation bilaterally Back: No paraspinal tenderness Skin/Extremities: No rash, no edema Neurological Exam: alert and oriented to person, place, and time. No aphasia or dysarthria. Fund of knowledge is appropriate.  Recent and remote memory are intact.  Attention and concentration are normal.    Able to name objects and repeat phrases. Cranial nerves: Pupils equal, round, reactive to light. Extraocular movements intact with no nystagmus. Visual fields full. Facial sensation intact. No facial asymmetry. Tongue, uvula, palate midline.  Motor: Bulk and tone normal, muscle strength 5/5 throughout with no pronator drift.  Sensation to light touch intact.  No extinction to double simultaneous stimulation. Finger to nose testing intact.  Gait narrow-based and steady, able to tandem walk adequately.  Romberg negative.  IMPRESSION: This is a pleasant 62 yo RH man with a history of history of hyperlipidemia, atrial flutter s/p ablation, who presented with a GTC on 09/04/17, found to have a subdural hematoma from fall 3 weeks prior. He underwent right craniotomy, most recent imaging  showed continued improvement of right subdural hematoma, no evidence of left subdural collection. EEG showed right hemisphere slowing. He has had no further GTCs since 7/2, no further episodes of aphasia since 09/19/17. We discussed early versus late post-traumatic seizures, and risks for recurrence. Would continue medication for at least 6 months, if he is interested in weaning off medication, repeat head CT without contrast and 1-hour EEG will be done, and if normal, we will discuss weaning. We discussed risks of breakthrough seizure with any medication adjustment. Continue oxcarbazepine 300mg  in  AM, 600mg  in PM and monitor dizziness. If dizziness appears medication related, we will try oxcarbazepine ER to minimize side effects. We discussed seizure restrictions. He is aware of [place] driving laws to stop driving after a seizure, until 6 months seizure-free. He will follow-up in 3 months and knows to call for any changes.   Thank you for allowing me to participate in his care.  Please do not hesitate to call for any questions or concerns.  The duration of this appointment visit was 28 minutes of face-to-face time with the patient.  Greater than 50% of this time was spent in counseling, explanation of diagnosis, planning of further management, and coordination of care.   Ellouise Newer, M.D.   CC: Dorothyann Peng, NP

## 2017-12-11 DIAGNOSIS — G4733 Obstructive sleep apnea (adult) (pediatric): Secondary | ICD-10-CM | POA: Diagnosis not present

## 2017-12-17 ENCOUNTER — Other Ambulatory Visit: Payer: BLUE CROSS/BLUE SHIELD

## 2017-12-31 DIAGNOSIS — H401131 Primary open-angle glaucoma, bilateral, mild stage: Secondary | ICD-10-CM | POA: Diagnosis not present

## 2017-12-31 DIAGNOSIS — H40052 Ocular hypertension, left eye: Secondary | ICD-10-CM | POA: Diagnosis not present

## 2017-12-31 DIAGNOSIS — H04123 Dry eye syndrome of bilateral lacrimal glands: Secondary | ICD-10-CM | POA: Diagnosis not present

## 2018-01-07 DIAGNOSIS — R569 Unspecified convulsions: Secondary | ICD-10-CM

## 2018-01-07 DIAGNOSIS — S065X9A Traumatic subdural hemorrhage with loss of consciousness of unspecified duration, initial encounter: Secondary | ICD-10-CM

## 2018-01-07 DIAGNOSIS — R561 Post traumatic seizures: Secondary | ICD-10-CM

## 2018-01-07 DIAGNOSIS — S065XAA Traumatic subdural hemorrhage with loss of consciousness status unknown, initial encounter: Secondary | ICD-10-CM

## 2018-01-09 ENCOUNTER — Ambulatory Visit (INDEPENDENT_AMBULATORY_CARE_PROVIDER_SITE_OTHER): Payer: BLUE CROSS/BLUE SHIELD | Admitting: Internal Medicine

## 2018-01-09 VITALS — BP 124/90 | HR 67 | Ht 72.0 in | Wt 196.0 lb

## 2018-01-09 DIAGNOSIS — Z9989 Dependence on other enabling machines and devices: Secondary | ICD-10-CM

## 2018-01-09 DIAGNOSIS — G4733 Obstructive sleep apnea (adult) (pediatric): Secondary | ICD-10-CM

## 2018-01-09 DIAGNOSIS — I483 Typical atrial flutter: Secondary | ICD-10-CM

## 2018-01-09 DIAGNOSIS — I480 Paroxysmal atrial fibrillation: Secondary | ICD-10-CM

## 2018-01-09 NOTE — Patient Instructions (Signed)
Your physician recommends that you schedule a follow-up appointment as needed with Dr. Hilty.  

## 2018-01-11 ENCOUNTER — Encounter: Payer: Self-pay | Admitting: Internal Medicine

## 2018-01-11 NOTE — Progress Notes (Signed)
OFFICE NOTE  Chief Complaint:  Routine follow-up  Primary Care Physician: Dorothyann Peng, NP  HPI:  Steven Byrd is a 62 y.o. male with a past medical history significant for hyperlipidemia. He has been having recurrent palpitations over the past month, but worse in the past 2 days. He denies chest pain or dyspnea. He has been able to exercise without much difficulty, however, he noted his HR was higher than normal. Typically his resting HR is in the 50-60 range. He went to his PCP who referred him to the ER for an SVT. He was noted to be in 2:1 atrial flutter in the ER. He was given adenosine in the ER which caused a pause, but did not slow his rhythm. He was then given 15 mg IV cardizem and eventually converted back to sinus rhythm. His CHADSVASC score is 0. At discharge I recommended he stay on aspirin and start on long-acting Cardizem. He said that he wished to only take medication as needed. I warned him that he may have recurrent atrial flutter and affect did have a couple of episodes. He called the office and was eventually placed on Cardizem 120 mg 3 times a day. He's been taking this dose and has noted no recurrence of his atrial flutter. He had several questions today about management of atrial flutter and is pretty clearly is not what take medications for long periods of time. In addition he reports that in the past he's had an informal sleep study and underwent a uvulopalatoplasty, but still reports that he is told that he snores, stops breathing and may very well have apnea. Of course this may be a risk factor for his atrial flutter. He did undergo an echocardiogram which showed a normal EF, normal wall motion and normal diastolic function. Wall thickness is also normal. Chamber sizes are normal. There was a top normal descending aortic diameter. Otherwise agree benign echocardiogram. As mentioned in my hospital consult note, he is very athletic, exercises a number days a week  including long-distance running and cycling and he denies any anginal symptoms.  07/23/2017  Steven Byrd returns today for follow-up of atrial flutter.  He recently was evaluated by Dr. Rayann Heman and determined to be candidate for atrial flutter ablation.  He underwent that procedure in January.  This was successful and resolve the symptoms.  He is CHADSVASC score is 0 and he was instructed to come off of aspirin, diltiazem and wean metoprolol.  He is now off of all medications and doing well.  Blood pressures well controlled.  He denies any chest pain or shortness of breath or recurrent palpitations.  01/09/2018  Steven Byrd was seen today in routine follow-up.  Unfortunately he was recently hospitalized and had some paroxysmal atrial fibrillation.  This was related to a fall which involved a subdural hematoma.  He was placed on Cardizem but cannot be anticoagulated.  He followed up in the A. fib clinic and had been did not to have any recurrent A. fib.  He follows up with me today.  Overall he seems to be doing well.  He is rehabilitating and has follow-up with neurology.  PMHx:  Past Medical History:  Diagnosis Date  . Atrial flutter ([place])   . Dyslipidemia   . OSA on CPAP 01/20/2014  . Paroxysmal atrial fibrillation [facility])     Past Surgical History:  Procedure Laterality Date  . A-FLUTTER ABLATION N/A 03/22/2017   Procedure: A-FLUTTER ABLATION;  Surgeon: Thompson Grayer, MD;  Location: [facility];  Service: Cardiovascular;  Laterality: N/A;  . CRANIOTOMY Right 09/04/2017   Procedure: CRANIOTOMY FOR SUBDURAL HEMATOMA;  Surgeon: Erline Levine, MD;  Location: [facility];  Service: Neurosurgery;  Laterality: Right;  . NO PAST SURGERIES      FAMHx:  Family History  Problem Relation Age of Onset  . Diabetes Father   . Stroke Father   . Hypertension Mother        ?  [name] Heart attack Maternal Grandfather   . Colon cancer Neg Hx     SOCHx:   reports that he quit smoking about 19 years ago.  He has never used smokeless tobacco. He reports that he drinks about 14.0 - 21.0 standard drinks of alcohol per week. He reports that he does not use drugs.  ALLERGIES:  Allergies  Allergen Reactions  . Ativan [Lorazepam]     irritable and agitated    ROS: Pertinent items noted in HPI and remainder of comprehensive ROS otherwise negative.  HOME MEDS: Current Outpatient Medications  Medication Sig Dispense Refill  . acyclovir (ZOVIRAX) 800 MG tablet Take 1 tablet (800 mg total) by mouth 3 (three) times daily as needed. 30 tablet 0  . CALCIUM-MAGNESIUM-ZINC PO Take 1 capsule by mouth daily.    . cholecalciferol (VITAMIN D) 1000 units tablet Take 1,000 Units by mouth daily.    . Coenzyme Q10 (CO Q 10 PO) Take 1 capsule by mouth daily.    [name] latanoprost (XALATAN) 0.005 % ophthalmic solution Place 1 drop into both eyes every evening.  4  . Multiple Vitamin (MULTIVITAMIN) capsule Take 1 capsule by mouth daily.    . Oxcarbazepine (TRILEPTAL) 300 MG tablet take 1/2 tablet twice a day for 3 days, then increase to 1 tablet twice a day for 3 days, then increase to 2 tablets twice a day 120 tablet 6  . Probiotic Product (PROBIOTIC-10) CAPS Take by mouth.    . simvastatin (ZOCOR) 40 MG tablet TAKE 1 TABLET BY MOUTH AT BEDTIME 90 tablet 3   No current facility-administered medications for this visit.     LABS/IMAGING: No results found for this or any previous visit (from the past 48 hour(s)). No results found.  VITALS: BP 124/90 (BP Location: Left Arm, Patient Position: Sitting, Cuff Size: Normal)   Pulse 67   Ht 6' (1.829 m)   Wt 196 lb (88.9 kg)   BMI 26.58 kg/m   EXAM: General appearance: alert and no distress Neck: no carotid bruit, no JVD and thyroid not enlarged, symmetric, no tenderness/mass/nodules Lungs: clear to auscultation bilaterally Heart: regular rate and rhythm, S1, S2 normal, no murmur, click, rub or gallop Abdomen: soft, non-tender; bowel sounds normal; no masses,  no  organomegaly Extremities: extremities normal, atraumatic, no cyanosis or edema Pulses: 2+ and symmetric Skin: Skin color, texture, turgor normal. No rashes or lesions Neurologic: Grossly normal Psych: Pleasant  EKG: We will sinus rhythm at 67 -personally reviewed  ASSESSMENT: 1. 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 Typical atrial flutter - s/p ablation (03/2017) 3. OSA- on CPAP  PLAN: 1.   Steven Byrd not had recurrent atrial fibrillation after suffering a traumatic subdural hematoma.  He does have a history of successful atrial flutter ablation earlier this year.  He is compliant with CPAP.  He possibly could have recurrent A. fib because it is unusual that he developed this.  Although it may have been related to his trauma, it is also strange that he developed a  subdural hematoma apparently a month after having had unintentional head injury.  Plan follow-up with me as needed.  Pixie Casino, MD, [facility], [place] Director of the [facility]  Cardiovascular Risk Reduction Clinic Diplomate of the American Board of Clinical Lipidology Attending Cardiologist  Direct Dial: [phone]  Fax: [phone]  Website:  [email or web address] [email or web address] 01/11/2018, 6:25 AM

## 2018-01-22 DIAGNOSIS — H40052 Ocular hypertension, left eye: Secondary | ICD-10-CM | POA: Diagnosis not present

## 2018-01-22 DIAGNOSIS — H401121 Primary open-angle glaucoma, left eye, mild stage: Secondary | ICD-10-CM | POA: Diagnosis not present

## 2018-02-12 DIAGNOSIS — H401111 Primary open-angle glaucoma, right eye, mild stage: Secondary | ICD-10-CM | POA: Diagnosis not present

## 2018-03-04 ENCOUNTER — Ambulatory Visit
Admission: RE | Admit: 2018-03-04 | Discharge: 2018-03-04 | Disposition: A | Discharge status: Home / Self Care | Payer: BLUE CROSS/BLUE SHIELD | Source: Ambulatory Visit | Attending: Neurology | Admitting: Neurology

## 2018-03-04 DIAGNOSIS — S065XAA Traumatic subdural hemorrhage with loss of consciousness status unknown, initial encounter: Secondary | ICD-10-CM

## 2018-03-04 DIAGNOSIS — S065X0A Traumatic subdural hemorrhage without loss of consciousness, initial encounter: Secondary | ICD-10-CM | POA: Diagnosis not present

## 2018-03-04 DIAGNOSIS — R561 Post traumatic seizures: Secondary | ICD-10-CM

## 2018-03-04 DIAGNOSIS — S065X9A Traumatic subdural hemorrhage with loss of consciousness of unspecified duration, initial encounter: Secondary | ICD-10-CM

## 2018-03-06 DIAGNOSIS — I714 Abdominal aortic aneurysm, without rupture, unspecified: Secondary | ICD-10-CM

## 2018-03-06 HISTORY — DX: Abdominal aortic aneurysm, without rupture, unspecified: I71.40

## 2018-03-06 HISTORY — DX: Abdominal aortic aneurysm, without rupture: I71.4

## 2018-03-19 ENCOUNTER — Other Ambulatory Visit: Payer: BLUE CROSS/BLUE SHIELD

## 2018-03-20 ENCOUNTER — Ambulatory Visit (INDEPENDENT_AMBULATORY_CARE_PROVIDER_SITE_OTHER): Payer: BLUE CROSS/BLUE SHIELD | Admitting: Neurology

## 2018-03-20 DIAGNOSIS — S065X9A Traumatic subdural hemorrhage with loss of consciousness of unspecified duration, initial encounter: Secondary | ICD-10-CM | POA: Diagnosis not present

## 2018-03-20 DIAGNOSIS — R561 Post traumatic seizures: Secondary | ICD-10-CM | POA: Diagnosis not present

## 2018-03-20 DIAGNOSIS — S065XAA Traumatic subdural hemorrhage with loss of consciousness status unknown, initial encounter: Secondary | ICD-10-CM

## 2018-03-25 NOTE — Procedures (Signed)
ELECTROENCEPHALOGRAM REPORT  Date of Study: 03/20/2018  Patient's Name: Steven Byrd MRN: [id number] Date of Birth: [birth year]  Referring Provider: Dr. Ellouise Newer  Clinical History: This is a 63 year old man with a history of subdural hematoma with 1 GTC and episodes of transient aphasia, interested in medication taper.  Medications: TRILEPTAL 300 MG tablet   ZOVIRAX 800 MG tablet   CALCIUM-MAGNESIUM-ZINC PO  VITAMIN D 1000 units tablet  CO Q 10 PO   CARDIZEM LA 120 MG 24 hr tablet  XALATAN 0.005 % ophthalmic solution  MULTIVITAMIN capsule   PROBIOTIC-10 CAPS  ZOCOR 40 MG tablet  Technical Summary: A multichannel digital 1-hour EEG recording measured by the international 10-20 system with electrodes applied with paste and impedances below 5000 ohms performed in our laboratory with EKG monitoring in an awake and asleep patient.  Hyperventilation and photic stimulation were performed.  The digital EEG was referentially recorded, reformatted, and digitally filtered in a variety of bipolar and referential montages for optimal display.    Description: The patient is awake and asleep during the recording.  During maximal wakefulness, there is an asymmetric 9-9.5 Hz posterior dominant rhythm, with higher voltage seen over the right occipital region. There is occasional focal 3-4 Hz slowing seen over the right temporal region. Breach artifact is seen over the right centrotemporoparietal region with sharply contoured, higher voltage, and faster frequencies seen. During drowsiness and sleep, there is an increase in theta slowing of the background.  Vertex waves and symmetric sleep spindles were seen.  Hyperventilation and photic stimulation did not elicit any abnormalities.  There were no clear epileptiform discharges or electrographic seizures seen.    EKG lead was unremarkable.  Impression: This 1-hour awake and asleep EEG is abnormal due to the presence of: 1. Occasional focal  slowing over the right temporal region 2. Breach artifact over the right centrotemporoparietal region  Clinical Correlation of the above findings indicates focal cerebral dysfunction suggestive of underlying structural or physiologic abnormality over the right temporal region. Breach artifact is consistent with prior surgery in this region. The absence of epileptiform discharges does not exclude a clinical diagnosis of epilepsy.  If further clinical questions remain, prolonged EEG may be helpful.  Clinical correlation is advised.   Ellouise Newer, M.D.

## 2018-03-26 ENCOUNTER — Encounter: Payer: Self-pay | Admitting: Neurology

## 2018-03-26 ENCOUNTER — Other Ambulatory Visit: Payer: Self-pay

## 2018-03-26 ENCOUNTER — Ambulatory Visit: Payer: BLUE CROSS/BLUE SHIELD | Admitting: Neurology

## 2018-03-26 VITALS — BP 114/68 | HR 66 | Ht 73.0 in | Wt 196.0 lb

## 2018-03-26 DIAGNOSIS — R561 Post traumatic seizures: Secondary | ICD-10-CM | POA: Diagnosis not present

## 2018-03-26 DIAGNOSIS — H401131 Primary open-angle glaucoma, bilateral, mild stage: Secondary | ICD-10-CM | POA: Diagnosis not present

## 2018-03-26 DIAGNOSIS — H40052 Ocular hypertension, left eye: Secondary | ICD-10-CM | POA: Diagnosis not present

## 2018-03-26 MED ORDER — OXCARBAZEPINE 300 MG PO TABS: ORAL_TABLET | ORAL | 3 refills | Status: DC

## 2018-03-26 NOTE — Patient Instructions (Signed)
1. Continue Trileptal 300mg : Take 1 tab in AM, 2 tabs in PM 2. Follow-up in 6 months or so, call for any changes  Seizure Precautions: 1. If medication has been prescribed for you to prevent seizures, take it exactly as directed.  Do not stop taking the medicine without talking to your doctor first, even if you have not had a seizure in a long time.   2. Avoid activities in which a seizure would cause danger to yourself or to others.  Don't operate dangerous machinery, swim alone, or climb in high or dangerous places, such as on ladders, roofs, or girders.  Do not drive unless your doctor says you may.  3. If you have any warning that you may have a seizure, lay down in a safe place where you can't hurt yourself.    4.  No driving for 6 months from last seizure, as per [facility].   Please refer to the following link on the [place] website for more information: http://[email or web address]/[email or web address]/driving/drivingu.cfm   5.  Maintain good sleep hygiene. Avoid alcohol.  6.  Contact your doctor if you have any probles that may be related to the medicine you are taking.  7.  Call 911 and bring the patient back to the ED if:        A.  The seizure lasts longer than 5 minutes.       B.  The patient doesn't awaken shortly after the seizure  C.  The patient has new problems such as difficulty seeing, speaking or moving  D.  The patient was injured during the seizure  E.  The patient has a temperature over 102 F (39C)  F.  The patient vomited and now is having trouble breathing

## 2018-03-26 NOTE — Progress Notes (Signed)
NEUROLOGY FOLLOW UP OFFICE NOTE  Steven Byrd [id number] March 03, 1951  HISTORY OF PRESENT ILLNESS: I had the pleasure of seeing Steven Byrd in follow-up in the neurology clinic on 03/26/2018. He is again accompanied by his significant other who helps supplement the history today. 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 He had an initial GTC on 09/04/17, then had recurrent episodes of transient aphasia. Symptoms resolved with initiation of oxcarbazepine, no further episodes since 09/19/17. He had expressed interest in weaning off medication and had a repeat head CT without contrast on 03/04/18 which did not show any acute changes, no residual subdural hematoma seen, right frontotemporal craniotomy seen. His 1-hour wake and sleep EEG on 03/20/2018 showed occasional right temporal focal slowing and breach artifact over the right centrotemporoparietal region, no epileptiform discharges seen. He and his significant other continue to deny any episodes of aphasia since 09/19/17. No GTCs since July 2019. They deny any staring/unresponsive episodes, gaps in time, olfactory/gustatory hallucinations, focal numbness/tingling/weakness, myoclonic jerks. No headaches or vision changes. He has occasional dizziness with brief spinning when turning in bed. No falls. Friends have commented that "I look normal now, I guess I was not normal before."  History on Initial Assessment 09/17/2017: This is a pleasant 63 year old man with a history of hyperlipidemia, atrial flutter s/p ablation, presenting for new onset seizures that occurred in the setting of a subdural hematoma. He had fallen last May 2019 then 3 weeks later on 09/04/17, his friend came to his house and no one was answering the door. They broke through the door and found him under his desk face down convulsing until EMS arrived. There was note on admission that he had several other witnessed seizures and received several  doses of Versed. He was brought [facility] where head CT showed an acute mixed density subdural hematoma overlying the right cerebral convexity up to 72mm, with additional small acute subdural hemorrhage overlying the left frontal convexity up to 16mm. There was extension along the falx and left tentorium with blood seen at the foramen magnum, mass effect with 29mm right to left shift. He underwent emergent right craniotomy for evacuation. He initially had left-sided weakness with significant improvement on hospital discharge on 09/10/17. He was back in the ER the next day after an episode of sudden inability to get his words out. This lasted 4-5 minutes. He was aware throughout and could think of the word but could not say it. No focal symptoms, no associated headache. Repeat head CT showed slightly decreased right subdural hematoma size, post-surgical changes, stable left frontal subdural hematoma, no midline shift. Keppra dose was increased to 1000mg  BID. Since then, he has had several more similar episode of transient aphasia, he had one on Friday, 2 on Saturday, then one last Sunday. He would be visibly upset because he cannot articulate his words. He has been more tired on the higher dose of Keppra, and his significant other is concerned about an itchy rash on his back and abdomen.   He and his significant other deny any staring/unresponsive episodes, no gaps in time, olfactory/gustatory hallucinations, deja vu, rising epigastric sensation, focal numbness/tingling/weakness, myoclonic jerks.[name]   Epilepsy Risk Factors:  Bilateral subdural hematoma s/p fall R>L, s/p right craniotomy. Otherwise he had a normal birth and early development.  There is no history of febrile convulsions, CNS infections such as meningitis/encephalitis.  PAST MEDICAL HISTORY: Past Medical History:  Diagnosis Date  . Atrial  flutter ([place])   . Dyslipidemia   . OSA on CPAP 01/20/2014  . Paroxysmal atrial fibrillation [facility])      MEDICATIONS: Current Outpatient Medications on File Prior to Visit  Medication Sig Dispense Refill  . acyclovir (ZOVIRAX) 800 MG tablet Take 1 tablet (800 mg total) by mouth 3 (three) times daily as needed. 30 tablet 0  . CALCIUM-MAGNESIUM-ZINC PO Take 1 capsule by mouth daily.    . cholecalciferol (VITAMIN D) 1000 units tablet Take 1,000 Units by mouth daily.    . Coenzyme Q10 (CO Q 10 PO) Take 1 capsule by mouth daily.    [name] latanoprost (XALATAN) 0.005 % ophthalmic solution Place 1 drop into both eyes every evening.  4  . Multiple Vitamin (MULTIVITAMIN) capsule Take 1 capsule by mouth daily.    . Oxcarbazepine (TRILEPTAL) 300 MG tablet take 1/2 tablet twice a day for 3 days, then increase to 1 tablet twice a day for 3 days, then increase to 2 tablets twice a day 120 tablet 6  . Probiotic Product (PROBIOTIC-10) CAPS Take by mouth.    . simvastatin (ZOCOR) 40 MG tablet TAKE 1 TABLET BY MOUTH AT BEDTIME 90 tablet 3   No current facility-administered medications on file prior to visit.     ALLERGIES: Allergies  Allergen Reactions  . Ativan [Lorazepam]     irritable and agitated    FAMILY HISTORY: Family History  Problem Relation Age of Onset  . Diabetes Father   . Stroke Father   . Hypertension Mother        ?  [name] Heart attack Maternal Grandfather   . Colon cancer Neg Hx     SOCIAL HISTORY: Social History   Socioeconomic History  . Marital status: Divorced    Spouse name: Not on file  . Number of children: Not on file  . Years of education: Not on file  . Highest education level: Not on file  Occupational History  . Not on file  Social Needs  . Financial resource strain: Not on file  . Food insecurity:    Worry: Not on file    Inability: Not on file  . Transportation needs:    Medical: Not on file    Non-medical: Not on file  Tobacco Use  . Smoking status: Former Smoker    Last attempt to quit: 10/16/1998    Years since quitting: 19.4  . Smokeless tobacco:  Never Used  Substance and Sexual Activity  . Alcohol use: Yes    Alcohol/week: 14.0 - 21.0 standard drinks    Types: 14 - 21 Glasses of wine per week    Comment: red wine  . Drug use: No  . Sexual activity: Not on file  Lifestyle  . Physical activity:    Days per week: Not on file    Minutes per session: Not on file  . Stress: Not on file  Relationships  . Social connections:    Talks on phone: Not on file    Gets together: Not on file    Attends religious service: Not on file    Active member of club or organization: Not on file    Attends meetings of clubs or organizations: Not on file    Relationship status: Not on file  . Intimate partner violence:    Fear of current or ex partner: Not on file    Emotionally abused: Not on file    Physically abused: Not on file    Forced sexual activity:  Not on file  Other Topics Concern  . 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       He likes to hike, scuba dive, and travel      Lives in single story home    [place] - significant other   4 year degree    REVIEW OF SYSTEMS: Constitutional: No fevers, chills, or sweats, no generalized fatigue, change in appetite Eyes: No visual changes, double vision, eye pain Ear, nose and throat: No hearing loss, ear pain, nasal congestion, sore throat Cardiovascular: No chest pain, palpitations Respiratory:  No shortness of breath at rest or with exertion, wheezes GastrointestinaI: No nausea, vomiting, diarrhea, abdominal pain, fecal incontinence Genitourinary:  No dysuria, urinary retention or frequency Musculoskeletal:  No neck pain, back pain Integumentary: No rash, pruritus, skin lesions Neurological: as above Psychiatric: No depression, insomnia, anxiety Endocrine: No palpitations, fatigue, diaphoresis, mood swings, change in appetite, change in weight,  increased thirst Hematologic/Lymphatic:  No anemia, purpura, petechiae. Allergic/Immunologic: no itchy/runny eyes, nasal congestion, recent allergic reactions, rashes  PHYSICAL EXAM: Vitals:   03/26/18 0937  BP: 114/68  Pulse: 66  SpO2: 98%   General: No acute distress Head:  Normocephalic/atraumatic Neck: supple, no paraspinal tenderness, full range of motion Heart:  Regular rate and rhythm Lungs:  Clear to auscultation bilaterally Back: No paraspinal tenderness Skin/Extremities: No rash, no edema Neurological Exam: alert and oriented to person, place, and time. No aphasia or dysarthria. Fund of knowledge is appropriate.  Recent and remote memory are intact.  Attention and concentration are normal.    Able to name objects and repeat phrases. Cranial nerves: Pupils equal, round, reactive to light. Extraocular movements intact with no nystagmus. Visual fields full. Facial sensation intact. No facial asymmetry. Tongue, uvula, palate midline.  Motor: Bulk and tone normal, muscle strength 5/5 throughout with no pronator drift.  Sensation to light touch intact.  No extinction to double simultaneous stimulation. Finger to nose testing intact.  Gait narrow-based and steady, able to tandem walk adequately.  Romberg negative.  IMPRESSION: This is a pleasant 63 yo RH man with a history of history of hyperlipidemia, atrial flutter s/p ablation, who presented with a GTC on 09/04/17, found to have a subdural hematoma from fall 3 weeks prior. He underwent right craniotomy, most recent imaging did not show any further residual subdural hematoma. Most recent EEG showed right temporal focal slowing and breach artifact on the right. He had expressed interest in weaning off oxcarbazepine, we discussed early and late post-traumatic seizures, since he continued to have seizures after 1 week from head trauma, would recommend longer term anti-epileptic medication. We discussed risks of breakthrough seizure with any  medication adjustment. He and his wife would like to discuss and think about it. He is aware of [place] driving laws to stop driving after a seizure, until 6 months seizure-free. He will follow-up in 6 months and knows to call for any changes.   Thank you for allowing me to participate in his care.  Please do not hesitate to call for any questions or concerns.  The duration of this appointment visit was 26 minutes of face-to-face time with the patient.  Greater than 50% of this time was spent in counseling, explanation of diagnosis, planning of further management, and coordination of care.  Ellouise Newer, M.D.   CC: Dorothyann Peng, NP

## 2018-06-06 ENCOUNTER — Other Ambulatory Visit: Payer: Self-pay | Admitting: Adult Health

## 2018-06-06 DIAGNOSIS — E7801 Familial hypercholesterolemia: Secondary | ICD-10-CM

## 2018-06-06 NOTE — Telephone Encounter (Signed)
Requested medication (s) are due for refill today: yes  Requested medication (s) are on the active medication list: yes  Last refill:  Last refilled by Dr. Sherren Mocha on 03/26/17 #90 with 3 refills  Future visit scheduled: No  Notes to clinic:  Last refilled by Dr. Sherren Mocha    Requested Prescriptions  Pending Prescriptions Disp Refills   simvastatin (ZOCOR) 40 MG tablet 90 tablet 0    Sig: Take 1 tablet (40 mg total) by mouth at bedtime.     Cardiovascular:  Antilipid - Statins Failed - 06/06/2018  1:22 PM      Failed - Total Cholesterol in normal range and within 360 days    Cholesterol  Date Value Ref Range Status  07/26/2017 202 (H) 0 - 200 mg/dL Final    Comment:    ATP III Classification       Desirable:  < 200 mg/dL               Borderline High:  200 - 239 mg/dL          High:  > = 240 mg/dL         Failed - LDL in normal range and within 360 days    LDL Cholesterol  Date Value Ref Range Status  07/26/2017 130 (H) 0 - 99 mg/dL Final         Passed - HDL in normal range and within 360 days    HDL  Date Value Ref Range Status  07/26/2017 55.10 >39.00 mg/dL Final         Passed - Triglycerides in normal range and within 360 days    Triglycerides  Date Value Ref Range Status  09/04/2017 45 <150 mg/dL Final    Comment:    Performed at [facility], [facility] [address], [place], [place] [address]         Passed - Patient is not pregnant      Passed - Valid encounter within last 12 months    Recent Outpatient Visits          8 months ago Subacute subdural hematoma ([place])   [facility] [place], MD   10 months ago Routine general medical examination at a health care facility   [facility] [place], [place], NP   2 years ago Abscess of groin, right   [facility], [place], NP   2 years ago Abscess of groin, right   [facility], [place], NP   2 years ago Routine general  medical examination at a health care facility   [facility], [place], NP

## 2018-06-07 MED ORDER — SIMVASTATIN 40 MG PO TABS: 40.0000 mg | ORAL_TABLET | Freq: Every day | ORAL | 0 refills | Status: DC

## 2018-06-07 NOTE — Telephone Encounter (Signed)
Sent to the pharmacy by e-scribe. 

## 2018-08-28 ENCOUNTER — Encounter: Payer: Self-pay | Admitting: Adult Health

## 2018-08-28 ENCOUNTER — Other Ambulatory Visit: Payer: Self-pay

## 2018-08-28 ENCOUNTER — Ambulatory Visit (INDEPENDENT_AMBULATORY_CARE_PROVIDER_SITE_OTHER): Payer: Medicare Other | Admitting: Adult Health

## 2018-08-28 DIAGNOSIS — B009 Herpesviral infection, unspecified: Secondary | ICD-10-CM | POA: Diagnosis not present

## 2018-08-28 MED ORDER — ACYCLOVIR 800 MG PO TABS: 800.0000 mg | ORAL_TABLET | Freq: Three times a day (TID) | ORAL | 0 refills | Status: DC | PRN

## 2018-08-28 NOTE — Progress Notes (Signed)
Virtual Visit via Video Note  I connected with Steven Byrd on 08/28/18 at 11:30 AM EDT by a video enabled telemedicine application and verified that I am speaking with the correct person using two identifiers.  Location patient: home Location provider:work or home office Persons participating in the virtual visit: patient, provider  I discussed the limitations of evaluation and management by telemedicine and the availability of in person appointments. The patient expressed understanding and agreed to proceed.   HPI: 63 year old male who being evaluated today for an acute on chronic issue.  Reports that he developed a cold sore right lower lip a few days ago.  In the past he has taken acyclovir and this is worked well for him.  He would like to get a refill on this medication   ROS: See pertinent positives and negatives per HPI.  Past Medical History:  Diagnosis Date  . Atrial flutter ([place])   . Dyslipidemia   . OSA on CPAP 01/20/2014  . Paroxysmal atrial fibrillation [facility])     Past Surgical History:  Procedure Laterality Date  . A-FLUTTER ABLATION N/A 03/22/2017   Procedure: A-FLUTTER ABLATION;  Surgeon: Thompson Grayer, MD;  Location: [facility];  Service: Cardiovascular;  Laterality: N/A;  . CRANIOTOMY Right 09/04/2017   Procedure: CRANIOTOMY FOR SUBDURAL HEMATOMA;  Surgeon: Erline Levine, MD;  Location: [facility];  Service: Neurosurgery;  Laterality: Right;  . NO PAST SURGERIES      Family History  Problem Relation Age of Onset  . Diabetes Father   . Stroke Father   . Hypertension Mother        ?  [name] Heart attack Maternal Grandfather   . Colon cancer Neg Hx      Current Outpatient Medications:  .  acyclovir (ZOVIRAX) 800 MG tablet, Take 1 tablet (800 mg total) by mouth 3 (three) times daily as needed., Disp: 30 tablet, Rfl: 0 .  CALCIUM-MAGNESIUM-ZINC PO, Take 1 capsule by mouth daily., Disp: , Rfl:  .  cholecalciferol (VITAMIN D) 1000 units tablet, Take 1,000  Units by mouth daily., Disp: , Rfl:  .  Coenzyme Q10 (CO Q 10 PO), Take 1 capsule by mouth daily., Disp: , Rfl:  .  latanoprost (XALATAN) 0.005 % ophthalmic solution, Place 1 drop into both eyes every evening., Disp: , Rfl: 4 .  Multiple Vitamin (MULTIVITAMIN) capsule, Take 1 capsule by mouth daily., Disp: , Rfl:  .  Oxcarbazepine (TRILEPTAL) 300 MG tablet, Take 1 tablet in AM, 2 tabs in PM, Disp: 270 tablet, Rfl: 3 .  Probiotic Product (PROBIOTIC-10) CAPS, Take by mouth., Disp: , Rfl:  .  simvastatin (ZOCOR) 40 MG tablet, Take 1 tablet (40 mg total) by mouth at bedtime., Disp: 90 tablet, Rfl: 0  EXAM:  VITALS per patient if applicable:  GENERAL: alert, oriented, appears well and in no acute distress  HEENT: atraumatic, conjunttiva clear, no obvious abnormalities on inspection of external nose and ears  NECK: normal movements of the head and neck  LUNGS: on inspection no signs of respiratory distress, breathing rate appears normal, no obvious gross SOB, gasping or wheezing  CV: no obvious cyanosis  MS: moves all visible extremities without noticeable abnormality  PSYCH/NEURO: pleasant and cooperative, no obvious depression or anxiety, speech and thought processing grossly intact  ASSESSMENT AND PLAN:  Discussed the following assessment and plan:  No diagnosis found.     I discussed the assessment and treatment plan with the patient. The patient was provided an opportunity to  ask questions and all were answered. The patient agreed with the plan and demonstrated an understanding of the instructions.   The patient was advised to call back or seek an in-person evaluation if the symptoms worsen or if the condition fails to improve as anticipated.   Dorothyann Peng, NP

## 2018-09-01 ENCOUNTER — Other Ambulatory Visit: Payer: Self-pay | Admitting: Adult Health

## 2018-09-01 DIAGNOSIS — E7801 Familial hypercholesterolemia: Secondary | ICD-10-CM

## 2018-09-24 ENCOUNTER — Other Ambulatory Visit: Payer: Self-pay | Admitting: Neurology

## 2018-09-26 ENCOUNTER — Other Ambulatory Visit: Payer: Self-pay | Admitting: Neurology

## 2018-09-26 MED ORDER — OXCARBAZEPINE 300 MG PO TABS: ORAL_TABLET | ORAL | 0 refills | Status: DC

## 2018-09-26 NOTE — Telephone Encounter (Signed)
Oxcarbazepine medication refill to the [facility]. He will be out of meds tomorrow. Thanks!

## 2018-09-26 NOTE — Telephone Encounter (Signed)
Noted in office notes 03/26/18 goal dose of Oxycarbazepine (300mg  tab)  was 2 tablets twice a day. Patient called in for refill as is out tomorrow (next appt is 8/19) and current script in chart for med is 1 tab in am and 2 in pm. Called and spoke to patient's girlfriend who verified he is doing one in am and two in pm.   Would you like the refill (for a month until he comes back) to be for the original order of 2 tabs in am / 2 tabs in pm or for what he has been doing of 1 tab in am / 2 tabs in pm? Thanks.

## 2018-09-26 NOTE — Telephone Encounter (Signed)
Thanks for update, pls send in Rx for oxcarb 300mg  1 tab in AM, 2 tabs in PM. Thanks!

## 2018-09-26 NOTE — Telephone Encounter (Signed)
Spoke to [place] (girlfriend) and informed her the script was sent to [facility]. Also told her MD wants him to continue dose he is on of 1tab in am and 2 tabs in pm of the Oxycarbazepine. [name] verbalized understanding.

## 2018-10-23 ENCOUNTER — Encounter: Payer: Self-pay | Admitting: Neurology

## 2018-10-23 ENCOUNTER — Ambulatory Visit (INDEPENDENT_AMBULATORY_CARE_PROVIDER_SITE_OTHER): Payer: Medicare Other | Admitting: Neurology

## 2018-10-23 ENCOUNTER — Other Ambulatory Visit: Payer: Self-pay

## 2018-10-23 VITALS — BP 125/77 | HR 60 | Ht 72.0 in | Wt 180.0 lb

## 2018-10-23 DIAGNOSIS — R413 Other amnesia: Secondary | ICD-10-CM

## 2018-10-23 DIAGNOSIS — R561 Post traumatic seizures: Secondary | ICD-10-CM

## 2018-10-23 MED ORDER — OXCARBAZEPINE 300 MG PO TABS: ORAL_TABLET | ORAL | 3 refills | Status: DC

## 2018-10-23 NOTE — Patient Instructions (Signed)
Great seeing you! Continue oxcarbazepine 300mg : take 1 tab in AM, 2 tabs in PM. Follow-up in 6-8 months, call for any changes  Seizure Precautions: 1. If medication has been prescribed for you to prevent seizures, take it exactly as directed.  Do not stop taking the medicine without talking to your doctor first, even if you have not had a seizure in a long time.   2. Avoid activities in which a seizure would cause danger to yourself or to others.  Don't operate dangerous machinery, swim alone, or climb in high or dangerous places, such as on ladders, roofs, or girders.  Do not drive unless your doctor says you may.  3. If you have any warning that you may have a seizure, lay down in a safe place where you can't hurt yourself.    4.  No driving for 6 months from last seizure, as per [facility].   Please refer to the following link on the [place] website for more information: http://[email or web address]/[email or web address]/driving/drivingu.cfm   5.  Maintain good sleep hygiene. Avoid alcohol.  6.  Contact your doctor if you have any problems that may be related to the medicine you are taking.  7.  Call 911 and bring the patient back to the ED if:        A.  The seizure lasts longer than 5 minutes.       B.  The patient doesn't awaken shortly after the seizure  C.  The patient has new problems such as difficulty seeing, speaking or moving  D.  The patient was injured during the seizure  E.  The patient has a temperature over 102 F (39C)  F.  The patient vomited and now is having trouble breathing         RECOMMENDATIONS FOR ALL PATIENTS WITH MEMORY PROBLEMS: 1. Continue to exercise (Recommend 30 minutes of walking everyday, or 3 hours every week) 2. Increase social interactions - continue going to [place] and enjoy social gatherings with friends and family 3. Eat healthy, avoid fried foods and eat more fruits and vegetables 4. Maintain adequate  blood pressure, blood sugar, and blood cholesterol level. Reducing the risk of stroke and cardiovascular disease also helps promoting better memory. 5. Avoid stressful situations. Live a simple life and avoid aggravations. Organize your time and prepare for the next day in anticipation. 6. Sleep well, avoid any interruptions of sleep and avoid any distractions in the bedroom that may interfere with adequate sleep quality 7. Avoid sugar, avoid sweets as there is a strong link between excessive sugar intake, diabetes, and cognitive impairment The Mediterranean diet has been shown to help patients reduce the risk of progressive memory disorders and reduces cardiovascular risk. This includes eating fish, eat fruits and green leafy vegetables, nuts like almonds and hazelnuts, walnuts, and also use olive oil. Avoid fast foods and fried foods as much as possible. Avoid sweets and sugar as sugar use has been linked to worsening of memory function.

## 2018-10-23 NOTE — Progress Notes (Signed)
NEUROLOGY FOLLOW UP OFFICE NOTE  Steven Byrd [id number] [birth year]  HISTORY OF PRESENT ILLNESS: I had the pleasure of seeing Steven Byrd in follow-up in the neurology clinic on 10/23/2018. He is again accompanied by his significant other Steven Byrd who helps supplement the history today. The patient was last seen 7 months ago for seizures secondary to subdural hematoma. He had an initial GTC on 09/04/17, then had recurrent episodes of transient aphasia. Symptoms resolved with initiation of oxcarbazepine, no further episodes since 09/19/17. He continues to do well seizure-free since July 2019. He is taking oxcarbazepine 300mg  1 tab in AM, 2 tabs in PM without side effects. He and Steven Byrd deny any episodes of aphasia, no staring/unresponsiveness, gaps in time, olfactory/gustatory hallucinations, focal numbness/tingling/weakness, myoclonic jerks. No dizziness, vision changes, no falls. He has dizziness which 90% of the time occurs when he gets up in the morning, it is brief. Steven Byrd states he is minimizing his symptoms and that it is not always positional, sometimes happening when walking. He reports it is mostly in the morning when he wakes up he has to hold on like he is drunk. He usually drinks 3-4 glasses of wine every night but is on "sabbatical" with no alcohol the past 6-7 days. He has noticed his memory is "not great," Steven Byrd reports he would not remember a conversation from the day prior. He denies getting lost driving, no missed bills or medications. He continues to work without difficulties. No family history of memory loss.   History on Initial Assessment 09/17/2017: This is a pleasant 63 year old man with a history of hyperlipidemia, atrial flutter s/p ablation, presenting for new onset seizures that occurred in the setting of a subdural hematoma. He had fallen last May 2019 then 3 weeks later on 09/04/17, his friend came to his house and no one was answering the door. They broke through the door and  found him under his desk face down convulsing until EMS arrived. There was note on admission that he had several other witnessed seizures and received several doses of Versed. He was brought the [facility] where head CT showed an acute mixed density subdural hematoma overlying the right cerebral convexity up to 4mm, with additional small acute subdural hemorrhage overlying the left frontal convexity up to 32mm. There was extension along the falx and left tentorium with blood seen at the foramen magnum, mass effect with 30mm right to left shift. He underwent emergent right craniotomy for evacuation. He initially had left-sided weakness with significant improvement on hospital discharge on 09/10/17. He was back in the ER the next day after an episode of sudden inability to get his words out. This lasted 4-5 minutes. He was aware throughout and could think of the word but could not say it. No focal symptoms, no associated headache. Repeat head CT showed slightly decreased right subdural hematoma size, post-surgical changes, stable left frontal subdural hematoma, no midline shift. Keppra dose was increased to 1000mg  BID. Since then, he has had several more similar episode of transient aphasia, he had one on Friday, 2 on Saturday, then one last Sunday. He would be visibly upset because he cannot articulate his words. He has been more tired on the higher dose of Keppra, and his significant other is concerned about an itchy rash on his back and abdomen.   He and his significant other deny any staring/unresponsive episodes, no gaps in time, olfactory/gustatory hallucinations, deja vu, rising epigastric sensation, focal numbness/tingling/weakness, myoclonic jerks.[name]   Epilepsy Risk  Factors:  Bilateral subdural hematoma s/p fall R>L, s/p right craniotomy. Otherwise he had a normal birth and early development.  There is no history of febrile convulsions, CNS infections such as meningitis/encephalitis.  PAST MEDICAL HISTORY:  Past Medical History:  Diagnosis Date  . Atrial flutter ([place])   . Dyslipidemia   . OSA on CPAP 01/20/2014  . Paroxysmal atrial fibrillation [facility])     MEDICATIONS: Current Outpatient Medications on File Prior to Visit  Medication Sig Dispense Refill  . acyclovir (ZOVIRAX) 800 MG tablet Take 1 tablet (800 mg total) by mouth 3 (three) times daily as needed. 30 tablet 0  . CALCIUM-MAGNESIUM-ZINC PO Take 1 capsule by mouth daily.    . cholecalciferol (VITAMIN D) 1000 units tablet Take 1,000 Units by mouth daily.    . Coenzyme Q10 (CO Q 10 PO) Take 1 capsule by mouth daily.    [name] latanoprost (XALATAN) 0.005 % ophthalmic solution Place 1 drop into both eyes every evening.  4  . Multiple Vitamin (MULTIVITAMIN) capsule Take 1 capsule by mouth daily.    . Oxcarbazepine (TRILEPTAL) 300 MG tablet TAKE 1 TABLET IN AM AND 2 TABS IN PM 270 tablet 3  . Probiotic Product (PROBIOTIC-10) CAPS Take by mouth.    . simvastatin (ZOCOR) 40 MG tablet TAKE 1 TABLET BY MOUTH EVERYDAY AT BEDTIME 90 tablet 0   No current facility-administered medications on file prior to visit.     ALLERGIES: Allergies  Allergen Reactions  . Ativan [Lorazepam]     irritable and agitated    FAMILY HISTORY: Family History  Problem Relation Age of Onset  . Diabetes Father   . Stroke Father   . Hypertension Mother        ?  [name] Heart attack Maternal Grandfather   . Colon cancer Neg Hx     SOCIAL HISTORY: Social History   Socioeconomic History  . Marital status: Divorced    Spouse name: Not on file  . Number of children: Not on file  . Years of education: Not on file  . Highest education level: Not on file  Occupational History  . Not on file  Social Needs  . Financial resource strain: Not on file  . Food insecurity    Worry: Not on file    Inability: Not on file  . Transportation needs    Medical: Not on file    Non-medical: Not on file  Tobacco Use  . Smoking status: Former Smoker    Quit date: 10/16/1998     Years since quitting: 20.0  . Smokeless tobacco: Never Used  Substance and Sexual Activity  . Alcohol use: Yes    Alcohol/week: 14.0 - 21.0 standard drinks    Types: 14 - 21 Glasses of wine per week    Comment: red wine  . Drug use: No  . Sexual activity: Not on file  Lifestyle  . Physical activity    Days per week: Not on file    Minutes per session: Not on file  . Stress: Not on file  Relationships  . Social Herbalist on phone: Not on file    Gets together: Not on file    Attends religious service: Not on file    Active member of club or organization: Not on file    Attends meetings of clubs or organizations: Not on file    Relationship status: Not on file  . Intimate partner violence    Fear of current or  ex partner: Not on file    Emotionally abused: Not on file    Physically abused: Not on file    Forced sexual activity: Not on file  Other Topics Concern  . Not on file  Social History Narrative   Has a Licensed conveyancer company since 1978      Daughter who is 33 and a Son who is 60   - Daughter in [place], Son is in [place].       He likes to hike, scuba dive, and travel      Lives in single story home    [employer] - significant other   4 year degree    REVIEW OF SYSTEMS: Constitutional: No fevers, chills, or sweats, no generalized fatigue, change in appetite Eyes: No visual changes, double vision, eye pain Ear, nose and throat: No hearing loss, ear pain, nasal congestion, sore throat Cardiovascular: No chest pain, palpitations Respiratory:  No shortness of breath at rest or with exertion, wheezes GastrointestinaI: No nausea, vomiting, diarrhea, abdominal pain, fecal incontinence Genitourinary:  No dysuria, urinary retention or frequency Musculoskeletal:  No neck pain, back pain Integumentary: No rash, pruritus, skin lesions Neurological: as above Psychiatric: No depression, insomnia, anxiety Endocrine: No palpitations, fatigue, diaphoresis, mood  swings, change in appetite, change in weight, increased thirst Hematologic/Lymphatic:  No anemia, purpura, petechiae. Allergic/Immunologic: no itchy/runny eyes, nasal congestion, recent allergic reactions, rashes  PHYSICAL EXAM: Vitals:   10/23/18 1010  BP: 125/77  Pulse: 60  SpO2: 97%   General: No acute distress Head:  Normocephalic/atraumatic Skin/Extremities: No rash, no edema Neurological Exam: alert and oriented to person, place, and time. No aphasia or dysarthria. Fund of knowledge is appropriate.  Recent and remote memory are intact.  Attention and concentration are normal.    Able to name objects and repeat phrases.  Montreal Cognitive Assessment  10/23/2018  Visuospatial/ Executive (0/5) 5  Naming (0/3) 3  Attention: Read list of digits (0/2) 2  Attention: Read list of letters (0/1) 1  Attention: Serial 7 subtraction starting at 100 (0/3) 3  Language: Repeat phrase (0/2) 2  Language : Fluency (0/1) 1  Abstraction (0/2) 2  Delayed Recall (0/5) 4  Orientation (0/6) 6  Total 29   Cranial nerves: Pupils equal, round, reactive to light. Extraocular movements intact with no nystagmus. Visual fields full. Facial sensation intact. No facial asymmetry. Tongue, uvula, palate midline.  Motor: Bulk and tone normal, muscle strength 5/5 throughout with no pronator drift. Finger to nose testing intact.  Gait narrow-based and steady, able to tandem walk adequately.  Romberg negative.  IMPRESSION: This is a pleasant 63 yo RH man with a history of history of hyperlipidemia, atrial flutter s/p ablation, who presented with a GTC on 09/04/17, found to have a subdural hematoma from fall 3 weeks prior. He underwent right craniotomy, repeat head imaging did not show any further residual subdural hematoma. Repeat EEG showed right temporal focal slowing and breach artifact on the right. Continue oxcarbazepine 300mg  in AM, 600mg  in PM. He remains interested in weaning off medication, would wait until 2  years seizure-free and re-evaluate. Monitor dizziness, appears positional. They report memory issues, MOCA score today 29/30, no difficulties with complex tasks. We discussed different causes of memory issues, including alcohol use, continue with reduced intake. Would check TSH and B12 with next PCP visit. We discussed the importance of control of vascular risk factors, physical exercise, and brain stimulation exercises for brain health. He is aware of [place] driving laws  to stop driving after a seizure, until 6 months seizure-free. He will follow-up in 6-8 months and knows to call for any changes.   Thank you for allowing me to participate in his care.  Please do not hesitate to call for any questions or concerns.   Ellouise Newer, M.D.   CC: Dorothyann Peng, NP

## 2018-11-05 ENCOUNTER — Encounter: Payer: Self-pay | Admitting: Adult Health

## 2018-11-05 DIAGNOSIS — H25013 Cortical age-related cataract, bilateral: Secondary | ICD-10-CM | POA: Diagnosis not present

## 2018-11-05 DIAGNOSIS — H2513 Age-related nuclear cataract, bilateral: Secondary | ICD-10-CM | POA: Diagnosis not present

## 2018-11-05 DIAGNOSIS — H04123 Dry eye syndrome of bilateral lacrimal glands: Secondary | ICD-10-CM | POA: Diagnosis not present

## 2018-11-05 DIAGNOSIS — H401131 Primary open-angle glaucoma, bilateral, mild stage: Secondary | ICD-10-CM | POA: Diagnosis not present

## 2018-11-06 ENCOUNTER — Other Ambulatory Visit: Payer: Self-pay

## 2018-11-06 ENCOUNTER — Encounter: Payer: Self-pay | Admitting: Adult Health

## 2018-11-06 ENCOUNTER — Ambulatory Visit (INDEPENDENT_AMBULATORY_CARE_PROVIDER_SITE_OTHER): Payer: Medicare Other | Admitting: Adult Health

## 2018-11-06 VITALS — BP 124/86 | Temp 98.0°F | Ht 70.5 in | Wt 177.0 lb

## 2018-11-06 DIAGNOSIS — R351 Nocturia: Secondary | ICD-10-CM | POA: Diagnosis not present

## 2018-11-06 DIAGNOSIS — Z23 Encounter for immunization: Secondary | ICD-10-CM

## 2018-11-06 DIAGNOSIS — Z136 Encounter for screening for cardiovascular disorders: Secondary | ICD-10-CM

## 2018-11-06 DIAGNOSIS — Z9989 Dependence on other enabling machines and devices: Secondary | ICD-10-CM

## 2018-11-06 DIAGNOSIS — E785 Hyperlipidemia, unspecified: Secondary | ICD-10-CM

## 2018-11-06 DIAGNOSIS — R569 Unspecified convulsions: Secondary | ICD-10-CM

## 2018-11-06 DIAGNOSIS — G4733 Obstructive sleep apnea (adult) (pediatric): Secondary | ICD-10-CM | POA: Diagnosis not present

## 2018-11-06 DIAGNOSIS — R4189 Other symptoms and signs involving cognitive functions and awareness: Secondary | ICD-10-CM

## 2018-11-06 DIAGNOSIS — N401 Enlarged prostate with lower urinary tract symptoms: Secondary | ICD-10-CM | POA: Diagnosis not present

## 2018-11-06 LAB — COMPREHENSIVE METABOLIC PANEL
ALT: 17 U/L (ref 0–53)
AST: 20 U/L (ref 0–37)
Albumin: 4.3 g/dL (ref 3.5–5.2)
Alkaline Phosphatase: 52 U/L (ref 39–117)
BUN: 19 mg/dL (ref 6–23)
CO2: 30 mEq/L (ref 19–32)
Calcium: 9.8 mg/dL (ref 8.4–10.5)
Chloride: 98 mEq/L (ref 96–112)
Creatinine, Ser: 0.84 mg/dL (ref 0.40–1.50)
GFR: 91.63 mL/min (ref >60.00)
Glucose, Bld: 97 mg/dL (ref 70–99)
Potassium: 4.7 mEq/L (ref 3.5–5.1)
Sodium: 134 mEq/L — ABNORMAL LOW (ref 135–145)
Total Bilirubin: 0.7 mg/dL (ref 0.2–1.2)
Total Protein: 7.4 g/dL (ref 6.0–8.3)

## 2018-11-06 LAB — CBC WITH DIFFERENTIAL/PLATELET
Basophils Absolute: 0 10*3/uL (ref 0.0–0.1)
Basophils Relative: 0.9 % (ref 0.0–3.0)
Eosinophils Absolute: 0.1 10*3/uL (ref 0.0–0.7)
Eosinophils Relative: 1.7 % (ref 0.0–5.0)
HCT: 39.6 % (ref 39.0–52.0)
Hemoglobin: 13.7 g/dL (ref 13.0–17.0)
Lymphocytes Relative: 37.1 % (ref 12.0–46.0)
Lymphs Abs: 1.5 10*3/uL (ref 0.7–4.0)
MCHC: 34.5 g/dL (ref 30.0–36.0)
MCV: 101.4 fl — ABNORMAL HIGH (ref 78.0–100.0)
Monocytes Absolute: 0.5 10*3/uL (ref 0.1–1.0)
Monocytes Relative: 11.2 % (ref 3.0–12.0)
Neutro Abs: 2 10*3/uL (ref 1.4–7.7)
Neutrophils Relative %: 49.1 % (ref 43.0–77.0)
Platelets: 217 10*3/uL (ref 150.0–400.0)
RBC: 3.91 Mil/uL — ABNORMAL LOW (ref 4.22–5.81)
RDW: 13.1 % (ref 11.5–15.5)
WBC: 4.1 10*3/uL (ref 4.0–10.5)

## 2018-11-06 LAB — TSH: TSH: 1.87 u[IU]/mL (ref 0.35–4.50)

## 2018-11-06 LAB — LIPID PANEL
Cholesterol: 209 mg/dL — ABNORMAL HIGH (ref 0–200)
HDL: 57 mg/dL (ref >39.00)
LDL Cholesterol: 132 mg/dL — ABNORMAL HIGH (ref 0–99)
NonHDL: 151.69
Total CHOL/HDL Ratio: 4
Triglycerides: 99 mg/dL (ref 0.0–149.0)
VLDL: 19.8 mg/dL (ref 0.0–40.0)

## 2018-11-06 LAB — PSA: PSA: 1.11 ng/mL (ref 0.10–4.00)

## 2018-11-06 LAB — VITAMIN B12: Vitamin B-12: 663 pg/mL (ref 211–911)

## 2018-11-06 NOTE — Progress Notes (Signed)
Subjective:    Patient ID: Steven Byrd, male    DOB: 17-Nov-1955, 63 y.o.   MRN: [id number]  HPI Patient presents for yearly preventative medicine examination. He is a pleasant 63 year old male who  has a past medical history of Atrial flutter ([place]), Dyslipidemia, OSA on CPAP (01/20/2014), and Paroxysmal atrial fibrillation ([place]).  Hyperlipidemia - Controlled with Zocor 40 mg daily  Lab Results  Component Value Date   CHOL 202 (H) 07/26/2017   HDL 55.10 07/26/2017   LDLCALC 130 (H) 07/26/2017   LDLDIRECT 126.8 02/01/2010   TRIG 45 09/04/2017   CHOLHDL 4 07/26/2017   OSA - uses CPAP nightly. 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  Seizures felt to be secondary to subdural hematoma in 2019.  He is taking oxcarbazepine 300 mg in am and 600 mg in the pm.  Continues to do well and is seizure-free since July 2019.  He was last seen by neurology in August 2020.  At that time he denied episodes of aphasia, unresponsiveness, gaps in time, olfactory hallucinations, focal numbness, tingling, or weakness.  Was complaining of dizziness which 90% time occurred when he got up in the morning and it was brief.  Some mention during this visit that he has noticed that his memory has not been great.  His significant other who was with him at the time reported that he cannot remember conversation from the day prior.  There was no getting lost while driving or missed pills or medications.  His Moca score was 29 out of 30, he had no difficulties with complex tasks.  It was advised to check TSH and B12 at his next PCP visit  Atrial Flutter -status post ablation in 2019.  He denies chest pain, shortness of breath, or palpitation  Nocturia - reports getting up to use the restroom about two times a night. He does not have any trouble getting back to sleep. Does not feel as though he needs medication for this right now.    All immunizations and health maintenance protocols were reviewed  with the patient and needed orders were placed.  He is due for her yearly influenza vaccination as well as Prevnar 13 Appropriate screening laboratory values were ordered for the patient including screening of hyperlipidemia, renal function and hepatic function. If indicated by BPH, a PSA was ordered.  Medication reconciliation,  past medical history, social history, problem list and allergies were reviewed in detail with the patient  Goals were established with regard to weight loss, exercise, and  diet in compliance with medications. He is eating healthy and has started exercising more. He lives about a mile away from his office and on most days walks to and from work. He stays active working around his home and plans to get back into the gyms.   Wt Readings from Last 3 Encounters:  11/06/18 177 lb (80.3 kg)  10/23/18 180 lb (81.6 kg)  03/26/18 196 lb (88.9 kg)   End of life planning was discussed.  He is up-to-date on routine screening colonoscopies as well as dental and vision screens   Review of Systems  Constitutional: Negative.   HENT: Negative.   Eyes: Negative.   Respiratory: Negative.   Cardiovascular: Negative.   Gastrointestinal: Negative.   Endocrine: Negative.   Genitourinary: Negative.   Musculoskeletal: Negative.   Skin: Negative.   Allergic/Immunologic: Negative.   Neurological: Negative.   Hematological: Negative.   Psychiatric/Behavioral: Negative.   All  other systems reviewed and are negative.  Past Medical History:  Diagnosis Date  . Atrial flutter ([place])   . Dyslipidemia   . OSA on CPAP 01/20/2014  . Paroxysmal atrial fibrillation ([facility])     Social History   Socioeconomic History  . Marital status: Divorced    Spouse name: Not on file  . Number of children: Not on file  . Years of education: Not on file  . Highest education level: Not on file  Occupational History  . Not on file  Social Needs  . Financial resource strain: Not on file  . Food  insecurity    Worry: Not on file    Inability: Not on file  . Transportation needs    Medical: Not on file    Non-medical: Not on file  Tobacco Use  . Smoking status: Former Smoker    Quit date: 10/16/1998    Years since quitting: 20.0  . Smokeless tobacco: Never Used  Substance and Sexual Activity  . Alcohol use: Yes    Alcohol/week: 14.0 - 21.0 standard drinks    Types: 14 - 21 Glasses of wine per week    Comment: red wine  . Drug use: No  . Sexual activity: Yes    Partners: Female  Lifestyle  . Physical activity    Days per week: Not on file    Minutes per session: Not on file  . Stress: Not on file  Relationships  . Social Herbalist on phone: Not on file    Gets together: Not on file    Attends religious service: Not on file    Active member of club or organization: Not on file    Attends meetings of clubs or organizations: Not on file    Relationship status: Not on file  . Intimate partner violence    Fear of current or ex partner: Not on file    Emotionally abused: Not on file    Physically abused: Not on file    Forced sexual activity: Not on file  Other Topics Concern  . Not on file  Social History Narrative   Has a Licensed conveyancer company since 1978      Daughter who is 47 and a Son who is 33   - Daughter in [place], Son is in [place].       He likes to hike, scuba dive, and travel      Lives in single story home    [name] - significant other   4 year degree    Past Surgical History:  Procedure Laterality Date  . A-FLUTTER ABLATION N/A 03/22/2017   Procedure: A-FLUTTER ABLATION;  Surgeon: Thompson Grayer, MD;  Location: [facility];  Service: Cardiovascular;  Laterality: N/A;  . CRANIOTOMY Right 09/04/2017   Procedure: CRANIOTOMY FOR SUBDURAL HEMATOMA;  Surgeon: Erline Levine, MD;  Location: [place];  Service: Neurosurgery;  Laterality: Right;    Family History  Problem Relation Age of Onset  . Diabetes Father   . Stroke Father   .  Hypertension Mother        ?  [name] Heart attack Maternal Grandfather   . Colon cancer Neg Hx     Allergies  Allergen Reactions  . Ativan [Lorazepam]     irritable and agitated    Current Outpatient Medications on File Prior to Visit  Medication Sig Dispense Refill  . acyclovir (ZOVIRAX) 800 MG tablet Take 1 tablet (800 mg total) by mouth  3 (three) times daily as needed. 30 tablet 0  . CALCIUM-MAGNESIUM-ZINC PO Take 1 capsule by mouth daily.    . cholecalciferol (VITAMIN D) 1000 units tablet Take 1,000 Units by mouth daily.    . Coenzyme Q10 (CO Q 10 PO) Take 1 capsule by mouth daily.    . Multiple Vitamin (MULTIVITAMIN) capsule Take 1 capsule by mouth daily.    . Oxcarbazepine (TRILEPTAL) 300 MG tablet TAKE 1 TABLET IN AM AND 2 TABS IN PM 270 tablet 3  . Probiotic Product (PROBIOTIC-10) CAPS Take by mouth.    . simvastatin (ZOCOR) 40 MG tablet TAKE 1 TABLET BY MOUTH EVERYDAY AT BEDTIME 90 tablet 0   No current facility-administered medications on file prior to visit.     There were no vitals taken for this visit.      Objective:   Physical Exam Vitals signs and nursing note reviewed.  Constitutional:      General: He is not in acute distress.    Appearance: Normal appearance. He is not diaphoretic.  HENT:     Head: Normocephalic and atraumatic.     Right Ear: Tympanic membrane, ear canal and external ear normal. There is no impacted cerumen.     Left Ear: Tympanic membrane, ear canal and external ear normal. There is no impacted cerumen.     Nose: Nose normal. No congestion or rhinorrhea.     Mouth/Throat:     Mouth: Mucous membranes are moist.     Pharynx: Oropharynx is clear. No oropharyngeal exudate or posterior oropharyngeal erythema.  Eyes:     General: No scleral icterus.       Right eye: No discharge.        Left eye: No discharge.     Conjunctiva/sclera: Conjunctivae normal.     Pupils: Pupils are equal, round, and reactive to light.  Neck:     Musculoskeletal:  Normal range of motion and neck supple.     Thyroid: No thyromegaly.     Vascular: No JVD.     Trachea: No tracheal deviation.  Cardiovascular:     Rate and Rhythm: Normal rate and regular rhythm.     Pulses: Normal pulses.     Heart sounds: Normal heart sounds. No murmur. No friction rub. No gallop.   Pulmonary:     Effort: Pulmonary effort is normal. No respiratory distress.     Breath sounds: Normal breath sounds. No stridor. No wheezing or rales.  Chest:     Chest wall: No tenderness.  Abdominal:     General: Abdomen is flat. Bowel sounds are normal. There is no distension.     Palpations: Abdomen is soft. There is no mass.     Tenderness: There is no abdominal tenderness. There is no right CVA tenderness, left CVA tenderness, guarding or rebound.     Hernia: No hernia is present.  Musculoskeletal: Normal range of motion.        General: No swelling, tenderness, deformity or signs of injury.     Right lower leg: No edema.     Left lower leg: No edema.  Lymphadenopathy:     Cervical: No cervical adenopathy.  Skin:    General: Skin is warm and dry.     Capillary Refill: Capillary refill takes less than 2 seconds.     Coloration: Skin is not jaundiced or pale.     Findings: No bruising, erythema, lesion or rash.  Neurological:     General: No focal deficit present.  Mental Status: He is alert and oriented to person, place, and time.     Cranial Nerves: No cranial nerve deficit.     Sensory: No sensory deficit.     Motor: No weakness or abnormal muscle tone.     Coordination: Coordination normal.     Gait: Gait normal.     Deep Tendon Reflexes: Reflexes are normal and symmetric. Reflexes normal.  Psychiatric:        Mood and Affect: Mood normal.        Behavior: Behavior normal.        Thought Content: Thought content normal.        Judgment: Judgment normal.       Assessment & Plan:  1. OSA on CPAP - Great compliance.No issues  - CBC with Differential/Platelet -  Comprehensive metabolic panel - Lipid panel - TSH  2. Seizure ([facility]) - Continue with plan of care by Neurology  - CBC with Differential/Platelet - Comprehensive metabolic panel - Lipid panel - TSH  3. Dyslipidemia - Consider increase in statin  - CBC with Differential/Platelet - Comprehensive metabolic panel - Lipid panel - TSH  4. BPH associated with nocturia  - PSA  5. Cognitive impairment  - Vitamin B12  6. Encounter for abdominal aortic aneurysm (AAA) screening  - [place] AORTA MEDICARE SCREENING; Future   Dorothyann Peng, NP

## 2018-11-06 NOTE — Patient Instructions (Signed)
It was great seeing you today!   We will follow up with you regarding your blood work   Please continue to stay active and exercise   Follow up in one year or sooner if needed  Someone will call you to schedule your aorta screen.

## 2018-11-06 NOTE — Addendum Note (Signed)
Addended by: Miles Costain T on: 11/06/2018 11:51 AM   Modules accepted: Orders

## 2018-11-07 ENCOUNTER — Other Ambulatory Visit: Payer: Self-pay | Admitting: Family Medicine

## 2018-11-07 MED ORDER — ATORVASTATIN CALCIUM 40 MG PO TABS: 40.0000 mg | ORAL_TABLET | Freq: Every day | ORAL | 3 refills | Status: DC

## 2018-11-07 NOTE — Telephone Encounter (Signed)
Sent to the pharmacy by e-scribe. 

## 2018-11-08 NOTE — Addendum Note (Signed)
Addended by: Miles Costain T on: 11/08/2018 12:23 PM   Modules accepted: Orders

## 2018-11-12 DIAGNOSIS — L814 Other melanin hyperpigmentation: Secondary | ICD-10-CM | POA: Diagnosis not present

## 2018-11-12 DIAGNOSIS — Z85828 Personal history of other malignant neoplasm of skin: Secondary | ICD-10-CM | POA: Diagnosis not present

## 2018-11-12 DIAGNOSIS — D225 Melanocytic nevi of trunk: Secondary | ICD-10-CM | POA: Diagnosis not present

## 2018-11-12 DIAGNOSIS — L821 Other seborrheic keratosis: Secondary | ICD-10-CM | POA: Diagnosis not present

## 2018-11-12 DIAGNOSIS — D485 Neoplasm of uncertain behavior of skin: Secondary | ICD-10-CM | POA: Diagnosis not present

## 2018-11-12 DIAGNOSIS — L570 Actinic keratosis: Secondary | ICD-10-CM | POA: Diagnosis not present

## 2018-11-14 ENCOUNTER — Other Ambulatory Visit: Payer: Self-pay | Admitting: Adult Health

## 2018-11-14 DIAGNOSIS — B009 Herpesviral infection, unspecified: Secondary | ICD-10-CM

## 2018-11-15 ENCOUNTER — Ambulatory Visit
Admission: RE | Admit: 2018-11-15 | Discharge: 2018-11-15 | Disposition: A | Discharge status: Home / Self Care | Payer: Medicare Other | Source: Ambulatory Visit | Attending: Adult Health | Admitting: Adult Health

## 2018-11-15 DIAGNOSIS — Z87891 Personal history of nicotine dependence: Secondary | ICD-10-CM | POA: Diagnosis not present

## 2018-11-15 DIAGNOSIS — Z136 Encounter for screening for cardiovascular disorders: Secondary | ICD-10-CM | POA: Diagnosis not present

## 2018-11-15 NOTE — Telephone Encounter (Signed)
Sent to the pharmacy by e-scribe. 

## 2018-11-19 ENCOUNTER — Encounter: Payer: Self-pay | Admitting: Adult Health

## 2018-12-04 DIAGNOSIS — D485 Neoplasm of uncertain behavior of skin: Secondary | ICD-10-CM | POA: Diagnosis not present

## 2018-12-04 DIAGNOSIS — C4492 Squamous cell carcinoma of skin, unspecified: Secondary | ICD-10-CM | POA: Diagnosis not present

## 2018-12-04 DIAGNOSIS — C44722 Squamous cell carcinoma of skin of right lower limb, including hip: Secondary | ICD-10-CM | POA: Diagnosis not present

## 2018-12-10 ENCOUNTER — Other Ambulatory Visit: Payer: Self-pay | Admitting: Neurology

## 2018-12-20 ENCOUNTER — Telehealth: Payer: Self-pay | Admitting: Adult Health

## 2018-12-20 NOTE — Telephone Encounter (Signed)
Patient inquiring about his blood type, please advise

## 2018-12-23 NOTE — Telephone Encounter (Signed)
Pt would like to know if a calcium scoring test is something he should do, now that he is 63 yrs.

## 2018-12-24 NOTE — Telephone Encounter (Signed)
Spoke with the patient. He is aware of Cory's message. Nothing further needed.

## 2018-12-24 NOTE — Telephone Encounter (Signed)
He is considered low risk and does not likely need calcium scoring   We do not do blood typing as this is not covered by insurance. His cheapest option is to give blood at the [facility] or he can buy a test kit off [employer] that is under $10

## 2018-12-24 NOTE — Telephone Encounter (Signed)
Please advise 

## 2019-01-17 DIAGNOSIS — C44729 Squamous cell carcinoma of skin of left lower limb, including hip: Secondary | ICD-10-CM | POA: Diagnosis not present

## 2019-05-05 DIAGNOSIS — H401131 Primary open-angle glaucoma, bilateral, mild stage: Secondary | ICD-10-CM | POA: Diagnosis not present

## 2019-05-23 ENCOUNTER — Other Ambulatory Visit: Payer: Self-pay

## 2019-05-23 ENCOUNTER — Ambulatory Visit (INDEPENDENT_AMBULATORY_CARE_PROVIDER_SITE_OTHER): Payer: Medicare Other | Admitting: Neurology

## 2019-05-23 ENCOUNTER — Encounter: Payer: Self-pay | Admitting: Neurology

## 2019-05-23 VITALS — BP 122/82 | HR 67 | Ht 73.0 in | Wt 184.2 lb

## 2019-05-23 DIAGNOSIS — R42 Dizziness and giddiness: Secondary | ICD-10-CM | POA: Diagnosis not present

## 2019-05-23 DIAGNOSIS — R561 Post traumatic seizures: Secondary | ICD-10-CM | POA: Diagnosis not present

## 2019-05-23 MED ORDER — OXCARBAZEPINE 300 MG PO TABS: ORAL_TABLET | ORAL | 3 refills | Status: DC

## 2019-05-23 NOTE — Progress Notes (Signed)
NEUROLOGY FOLLOW UP OFFICE NOTE  [name] [id number] [birth year]  HISTORY OF PRESENT ILLNESS: I had the pleasure of seeing Steven Byrd in follow-up in the neurology clinic on 05/23/2019. He was last seen 7 months ago for seizures secondary to subdural hematoma. His significant other [name] is present to provide additional information. He had an initial GTC on 09/04/17, then had recurrent episodes of transient aphasia. Symptoms resolved with initiation of oxcarbazepine, none since 09/19/2017. He is overall tolerating oxcarbazepine 300mg  in AM, 600mg  in PM. They continue to report dizziness, some appears positional with spinning sensation when he stands up or after weight lifting. [name] also reports dizziness when sitting, yesterday he was just sitting in his office and said he was tired of the dizziness. No neck pain, diplopia, focal numbness/tingling/weakness. No episodes of aphasia, staring/unresponsive episodes, gaps in time, myoclonic jerks. Sleep is good with CPAP. Balance is not so great, no falls.   History on Initial Assessment 09/17/2017: This is a pleasant 64 year old man with a history of hyperlipidemia, atrial flutter s/p ablation, presenting for new onset seizures that occurred in the setting of a subdural hematoma. He had fallen last May 2019 then 3 weeks later on 09/04/17, his friend came to his house and no one was answering the door. They broke through the door and found him under his desk face down convulsing until EMS arrived. There was note on admission that he had several other witnessed seizures and received several doses of Versed. He was brought the [facility] where head CT showed an acute mixed density subdural hematoma overlying the right cerebral convexity up to 36mm, with additional small acute subdural hemorrhage overlying the left frontal convexity up to 19mm. There was extension along the falx and left tentorium with blood seen at the foramen magnum, mass effect with 78mm right to  left shift. He underwent emergent right craniotomy for evacuation. He initially had left-sided weakness with significant improvement on hospital discharge on 09/10/17. He was back in the ER the next day after an episode of sudden inability to get his words out. This lasted 4-5 minutes. He was aware throughout and could think of the word but could not say it. No focal symptoms, no associated headache. Repeat head CT showed slightly decreased right subdural hematoma size, post-surgical changes, stable left frontal subdural hematoma, no midline shift. Keppra dose was increased to 1000mg  BID. Since then, he has had several more similar episode of transient aphasia, he had one on Friday, 2 on Saturday, then one last Sunday. He would be visibly upset because he cannot articulate his words. He has been more tired on the higher dose of Keppra, and his significant other is concerned about an itchy rash on his back and abdomen.   He and his significant other deny any staring/unresponsive episodes, no gaps in time, olfactory/gustatory hallucinations, deja vu, rising epigastric sensation, focal numbness/tingling/weakness, myoclonic jerks.[name]   Epilepsy Risk Factors:  Bilateral subdural hematoma s/p fall R>L, s/p right craniotomy. Otherwise he had a normal birth and early development.  There is no history of febrile convulsions, CNS infections such as meningitis/encephalitis.  PAST MEDICAL HISTORY: Past Medical History:  Diagnosis Date  . AAA (abdominal aortic aneurysm) ([place]) 2020   3.2 cm in 2020   . Atrial flutter ([place])   . Dyslipidemia   . OSA on CPAP 01/20/2014  . Paroxysmal atrial fibrillation [facility])     MEDICATIONS: Current Outpatient Medications on File Prior to Visit  Medication Sig Dispense  Refill  . acyclovir (ZOVIRAX) 800 MG tablet TAKE 1 TABLET (800 MG TOTAL) BY MOUTH 3 (THREE) TIMES DAILY AS NEEDED. 30 tablet 3  . atorvastatin (LIPITOR) 40 MG tablet Take 1 tablet (40 mg total) by mouth daily. 90  tablet 3  . CALCIUM-MAGNESIUM-ZINC PO Take 1 capsule by mouth daily.    . cholecalciferol (VITAMIN D) 1000 units tablet Take 1,000 Units by mouth daily.    . Coenzyme Q10 (CO Q 10 PO) Take 1 capsule by mouth daily.    . Multiple Vitamin (MULTIVITAMIN) capsule Take 1 capsule by mouth daily.    . Oxcarbazepine (TRILEPTAL) 300 MG tablet Take one tablet in the morning and two tablets in the evening 270 tablet 3  . simvastatin (ZOCOR) 40 MG tablet TAKE 1 TABLET BY MOUTH EVERYDAY AT BEDTIME 90 tablet 0  . Probiotic Product (PROBIOTIC-10) CAPS Take by mouth.    . sildenafil (VIAGRA) 100 MG tablet Take 1 tablet (100 mg total) by mouth daily as needed for erectile dysfunction. 24 tablet 3   No current facility-administered medications on file prior to visit.    ALLERGIES: Allergies  Allergen Reactions  . Ativan [Lorazepam]     irritable and agitated    FAMILY HISTORY: Family History  Problem Relation Age of Onset  . Diabetes Father   . Stroke Father   . Hypertension Mother        ?  [name] Heart attack Maternal Grandfather   . Colon cancer Neg Hx     SOCIAL HISTORY: Social History   Socioeconomic History  . Marital status: Divorced    Spouse name: Not on file  . Number of children: Not on file  . Years of education: Not on file  . Highest education level: Not on file  Occupational History  . Not on file  Tobacco Use  . Smoking status: Former Smoker    Quit date: 10/16/1998    Years since quitting: 20.6  . Smokeless tobacco: Never Used  Substance and Sexual Activity  . Alcohol use: Yes    Alcohol/week: 14.0 - 21.0 standard drinks    Types: 14 - 21 Glasses of wine per week    Comment: red wine  . Drug use: No  . Sexual activity: Yes    Partners: Female  Other Topics Concern  . Not on file  Social History Narrative   Has a Licensed conveyancer company since 1978      Daughter who is 64 and a Son who is 28   - Daughter in [place], Son is in [place].       He likes to hike,  scuba dive, and travel      Lives in single story home    [name] - significant other   4 year degree   Right handed    Social Determinants of Health   Financial Resource Strain:   . Difficulty of Paying Living Expenses:   Food Insecurity:   . Worried About Charity fundraiser in the Last Year:   . Arboriculturist in the Last Year:   Transportation Needs:   . Film/video editor (Medical):   [name] Lack of Transportation (Non-Medical):   Physical Activity:   . Days of Exercise per Week:   . Minutes of Exercise per Session:   Stress:   . Feeling of Stress :   Social Connections:   . Frequency of Communication with Friends and Family:   . Frequency of Social Gatherings with  Friends and Family:   . Attends Religious Services:   . Active Member of Clubs or Organizations:   . Attends Archivist Meetings:   [name] Marital Status:   Intimate Partner Violence:   . Fear of Current or Ex-Partner:   . Emotionally Abused:   [name] Physically Abused:   . Sexually Abused:     REVIEW OF SYSTEMS: Constitutional: No fevers, chills, or sweats, no generalized fatigue, change in appetite Eyes: No visual changes, double vision, eye pain Ear, nose and throat: No hearing loss, ear pain, nasal congestion, sore throat Cardiovascular: No chest pain, palpitations Respiratory:  No shortness of breath at rest or with exertion, wheezes GastrointestinaI: No nausea, vomiting, diarrhea, abdominal pain, fecal incontinence Genitourinary:  No dysuria, urinary retention or frequency Musculoskeletal:  No neck pain, back pain Integumentary: No rash, pruritus, skin lesions Neurological: as above Psychiatric: No depression, insomnia, anxiety Endocrine: No palpitations, fatigue, diaphoresis, mood swings, change in appetite, change in weight, increased thirst Hematologic/Lymphatic:  No anemia, purpura, petechiae. Allergic/Immunologic: no itchy/runny eyes, nasal congestion, recent allergic reactions,  rashes  PHYSICAL EXAM: Vitals:   05/23/19 0857  BP: 122/82  Pulse: 67  SpO2: 96%   General: No acute distress Head:  Normocephalic/atraumatic Skin/Extremities: No rash, no edema Neurological Exam: alert and oriented to person, place, and time. No aphasia or dysarthria. Fund of knowledge is appropriate.  Recent and remote memory are intact.  Attention and concentration are normal. Cranial nerves: Pupils equal, round, reactive to light. Extraocular movements intact with no nystagmus. Visual fields full. No facial asymmetry. Motor: Bulk and tone normal, muscle strength 5/5 throughout with no pronator drift. Finger to nose testing intact.  Gait narrow-based and steady, able to tandem walk adequately.  Romberg negative.   IMPRESSION: This is a pleasant 64 yo RH man with a history of history of hyperlipidemia, atrial flutter s/p ablation, who presented with a GTC on 09/04/17, found to have a subdural hematoma from fall 3 weeks prior. He underwent right craniotomy, repeat head imaging did not show any further residual subdural hematoma. Repeat EEG showed right temporal focal slowing and breach artifact on the right. He has been seizure-free since 09/19/2017, we have agreed to wait 2 years seizure-free and plan to wean off oxcarbazepine. This may be contributing to dizziness, however some also appear positional. He would like to proceed with vestibular and balance therapy. He is aware of [place] driving laws to stop driving after a seizure, until 6 months seizure-free. He will follow-up in 4 months and knows to call for any changes.   Thank you for allowing me to participate in his care.  Please do not hesitate to call for any questions or concerns.   Ellouise Newer, M.D.   CC: Dorothyann Peng, NP

## 2019-05-23 NOTE — Patient Instructions (Signed)
1. Refer to PT for vestibular therapy and balance therapy  2. Continue oxcarbazepine 300mg : 1 tab in AM, 2 tabs in PM  3. Follow-up in July, call for any changes  Seizure Precautions:. 1. If medication has been prescribed for you to prevent seizures, take it exactly as directed.  Do not stop taking the medicine without talking to your doctor first, even if you have not had a seizure in a long time.   2. Avoid activities in which a seizure would cause danger to yourself or to others.  Don't operate dangerous machinery, swim alone, or climb in high or dangerous places, such as on ladders, roofs, or girders.  Do not drive unless your doctor says you may.  3. If you have any warning that you may have a seizure, lay down in a safe place where you can't hurt yourself.    4.  No driving for 6 months from last seizure, as per [facility].   Please refer to the following link on the [place] website for more information: http://[email or web address]/[email or web address]/driving/drivingu.cfm   5.  Maintain good sleep hygiene. Avoid alcohol.  6.  Contact your doctor if you have any problems that may be related to the medicine you are taking.  7.  Call 911 and bring the patient back to the ED if:        A.  The seizure lasts longer than 5 minutes.       B.  The patient doesn't awaken shortly after the seizure  C.  The patient has new problems such as difficulty seeing, speaking or moving  D.  The patient was injured during the seizure  E.  The patient has a temperature over 102 F (39C)  F.  The patient vomited and now is having trouble breathing

## 2019-06-04 ENCOUNTER — Telehealth: Payer: Self-pay | Admitting: Neurology

## 2019-06-04 NOTE — Telephone Encounter (Signed)
Patient called and left a message he is needing a referral to PT and he has been waiting for two weeks and still hasn't heard from anyone.

## 2019-06-05 NOTE — Telephone Encounter (Signed)
Patient is schedule for therapy and is aware.

## 2019-06-11 ENCOUNTER — Ambulatory Visit: Payer: Medicare Other | Attending: Adult Health | Admitting: Physical Therapy

## 2019-06-11 ENCOUNTER — Other Ambulatory Visit: Payer: Self-pay

## 2019-06-11 DIAGNOSIS — R42 Dizziness and giddiness: Secondary | ICD-10-CM | POA: Diagnosis not present

## 2019-06-11 DIAGNOSIS — H8112 Benign paroxysmal vertigo, left ear: Secondary | ICD-10-CM

## 2019-06-11 NOTE — Patient Instructions (Signed)
[email or web address]

## 2019-06-11 NOTE — Therapy (Signed)
[facility] [address] [place], [place], [address] Phone: [phone]   Fax:  [phone]  Physical Therapy Evaluation  Patient Details  Name: Steven Byrd MRN: [id number] Date of Birth: 04-02-1955 Referring Provider (PT): Cameron Sprang, MD   Encounter Date: 06/11/2019  PT End of Session - 06/11/19 1022    Visit Number  1    Number of Visits  4    Date for PT Re-Evaluation  07/11/19    Authorization Type  Medicare    PT Start Time  0932    PT Stop Time  1010    PT Time Calculation (min)  38 min    Activity Tolerance  Patient tolerated treatment well    Behavior During Therapy  [facility] for tasks assessed/performed       Past Medical History:  Diagnosis Date  . AAA (abdominal aortic aneurysm) ([place]) 2020   3.2 cm in 2020   . Atrial flutter ([place])   . Dyslipidemia   . OSA on CPAP 01/20/2014  . Paroxysmal atrial fibrillation [facility])     Past Surgical History:  Procedure Laterality Date  . A-FLUTTER ABLATION N/A 03/22/2017   Procedure: A-FLUTTER ABLATION;  Surgeon: Thompson Grayer, MD;  Location: [facility] CV [facility];  Service: Cardiovascular;  Laterality: N/A;  . CRANIOTOMY Right 09/04/2017   Procedure: CRANIOTOMY FOR SUBDURAL HEMATOMA;  Surgeon: Erline Levine, MD;  Location: [place];  Service: Neurosurgery;  Laterality: Right;    There were no vitals filed for this visit.   Subjective Assessment - 06/11/19 0936    Subjective  Has had multiple concussions throughout his life - most recent were due to a fall down a flight of stairs and then when returning from [place] pt found unconscious due to SDH and seizures.  Dizziness began 1.5 years ago.  Feels a spinning when going from prone/supine > standing or lying flat on a bench to work out.  Lasts 30 seconds to minute.    Pertinent History  AAA, atrial flutter with ablation, dyslipidemia, OSA with CPAP, Afib, craniotomy for SDH, seizures    Patient Stated Goals  To not be  dizzy when he wakes up in the morning    Currently in Pain?  No/denies         [facility] PT Assessment - 06/11/19 0941      Assessment   Medical Diagnosis  Vertigo    Referring Provider (PT)  Cameron Sprang, MD    Onset Date/Surgical Date  05/23/19    Prior Therapy  None      Precautions   Precautions  Other (comment)    Precaution Comments  AAA, atrial flutter with ablation, dyslipidemia, OSA with CPAP, Afib, craniotomy for SDH, seizures      Balance Screen   Has the patient fallen in the past 6 months  No    Has the patient had a decrease in activity level because of a fear of falling?   No    Is the patient reluctant to leave their home because of a fear of falling?   No      Home Film/video editor residence      Prior Function   Level of Independence  Independent    Vocation  Full time employment    Leisure  golf      Cognition   Overall Cognitive Status  Impaired/Different from baseline    Area of Impairment  Memory  Observation/Other Assessments   Focus on Therapeutic Outcomes (FOTO)   93% function    Other Surveys   Dizziness Handicap Inventory (DHI)    Dizziness Handicap Inventory (DHI)   18      Sensation   Light Touch  Appears Intact      ROM / Strength   AROM / PROM / Strength  Strength      Strength   Overall Strength  Within functional limits for tasks performed           Vestibular Assessment - 06/11/19 0945      Symptom Behavior   Subjective history of current problem  Denies N &V, denies changes in vision other than glaucoma, denies changes in hearing or tinnitus    Type of Dizziness   Vertigo    Frequency of Dizziness  daily    Duration of Dizziness  30 seconds    Symptom Nature  Motion provoked;Positional    Aggravating Factors  Rolling to right;Rolling to left;Supine to sit;Forward bending;Lying supine    Relieving Factors  Slow movements    Progression of Symptoms  No change since onset      Oculomotor  Exam   Oculomotor Alignment  Normal    Ocular ROM  WFL    Spontaneous  Absent    Gaze-induced   Absent    Smooth Pursuits  Intact    Saccades  Intact      Oculomotor Exam-Fixation Suppressed    Left Head Impulse  positive    Right Head Impulse  positive      Vestibulo-Ocular Reflex   VOR to Slow Head Movement  Normal    VOR Cancellation  Normal      Positional Testing   Dix-Hallpike  Dix-Hallpike Right;Dix-Hallpike Left    Horizontal Canal Testing  Horizontal Canal Right;Horizontal Canal Left      Dix-Hallpike Right   Dix-Hallpike Right Duration  0    Dix-Hallpike Right Symptoms  No nystagmus      Dix-Hallpike Left   Dix-Hallpike Left Duration  5 seconds    Dix-Hallpike Left Symptoms  Upbeat, left rotatory nystagmus      Horizontal Canal Right   Horizontal Canal Right Duration  0    Horizontal Canal Right Symptoms  Normal      Horizontal Canal Left   Horizontal Canal Left Duration  5 seconds    Horizontal Canal Left Symptoms  Nystagmus   left rotary         Objective measurements completed on examination: See above findings.       Vestibular Treatment/Exercise - 06/11/19 1003      Vestibular Treatment/Exercise   Vestibular Treatment Provided  Canalith Repositioning    Canalith Repositioning  Epley Manuever Left       EPLEY MANUEVER LEFT   Number of Reps   1    Overall Response   Symptoms Resolved            PT Education - 06/11/19 1021    Education Details  clinical findings, BPPV, PT POC and goals    Person(s) Educated  Patient    Methods  Explanation;Handout    Comprehension  Verbalized understanding          PT Long Term Goals - 06/11/19 1026      PT LONG TERM GOAL #1   Title  Pt will demonstrate negative positional testing and report no dizziness with rolling and supine <> sit    Time  4  Period  Weeks    Status  New    Target Date  07/11/19      PT LONG TERM GOAL #2   Title  Pt will demonstrate ability to independently  perform home CRM to address reoccurence of BPPV.    Time  4    Period  Weeks    Status  New    Target Date  07/11/19      PT LONG TERM GOAL #3   Title  Pt will report improvement in overall function to >95% on FOTO and 18 point decrease in [place]    Baseline  93%, DHI: 18    Time  4    Period  Weeks    Status  New    Target Date  07/11/19             Plan - 06/11/19 1022    Clinical Impression Statement  Pt is a 64 year old male referred to Neuro OPPT for evaluation of vertigo for >1 year.  Pt's PMH is significant for the following: AAA, atrial flutter with ablation, dyslipidemia, OSA with CPAP, Afib, craniotomy for SDH, seizures. The following deficits were noted during pt's exam: upbeating, L rotary nystagmus of short duration during L hallpike-dix manuever indicating L posterior canal canalithiasis, treated x1 with CRM and full resolution of symptoms.  Pt requested to return for one more visit to learn how to perform the home CRM for self-treatment.  Pt would benefit from skilled PT to address these impairments and functional limitations to maximize functional mobility independence and reduce falls risk.    Personal Factors and Comorbidities  Behavior Pattern;Comorbidity 3+;Past/Current Experience    Comorbidities  AAA, atrial flutter with ablation, dyslipidemia, OSA with CPAP, Afib, craniotomy for SDH, seizures    Examination-Activity Limitations  Bed Mobility;Bend    [employer] Activity;Other   golf   Stability/Clinical Decision Making  Stable/Uncomplicated    Clinical Decision Making  Low    Rehab Potential  Excellent    PT Frequency  1x / week    PT Duration  4 weeks    PT Treatment/Interventions  ADLs/Self Care Home Management;Canalith Repostioning;Functional mobility training;Therapeutic activities;Therapeutic exercise;Balance training;Neuromuscular re-education;Patient/family education;Vestibular    PT Next Visit Plan  Teach home Epley  manuever for L side; redo FOTO/DHI and D/C    Consulted and Agree with Plan of Care  Patient       Patient will benefit from skilled therapeutic intervention in order to improve the following deficits and impairments:  Dizziness  Visit Diagnosis: BPPV (benign paroxysmal positional vertigo), left  Dizziness and giddiness     Problem List Patient Active Problem List   Diagnosis Date Noted  . Bacteriuria with pyuria 09/05/2017  . Status post surgery 09/04/2017  . Subacute subdural hematoma ([place]) 09/04/2017  . Seizure ([place]) 09/04/2017  . Left hemiparesis ([place]) 09/04/2017  . Travel advice encounter 11/27/2014  . OSA on CPAP 01/20/2014  . Snoring 10/15/2013  . Witnessed apneic spells 10/15/2013  . Typical atrial flutter ([place]) 09/29/2013  . Erectile dysfunction 01/18/2012  . Benign fasciculations 01/18/2012  . Dyslipidemia 01/07/2007  . 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 [place], [place], [address] Phone: [phone]   Fax:  [phone]  Name: JAIMESON VITIELLO MRN: [id number] Date of Birth: 08/28/1955

## 2019-07-02 ENCOUNTER — Encounter: Payer: Self-pay | Admitting: Physical Therapy

## 2019-07-02 ENCOUNTER — Ambulatory Visit: Payer: Medicare Other | Admitting: Physical Therapy

## 2019-07-02 ENCOUNTER — Other Ambulatory Visit: Payer: Self-pay

## 2019-07-02 DIAGNOSIS — H8112 Benign paroxysmal vertigo, left ear: Secondary | ICD-10-CM

## 2019-07-02 DIAGNOSIS — R42 Dizziness and giddiness: Secondary | ICD-10-CM

## 2019-07-02 NOTE — Therapy (Signed)
[facility] [address] [place], [place], [address] Phone: [phone]   Fax:  [phone]  Physical Therapy Treatment  Patient Details  Name: Steven Byrd MRN: [id number] Date of Birth: 09/17/1955 Referring Provider (PT): Cameron Sprang, MD   Encounter Date: 07/02/2019  PT End of Session - 07/02/19 1013    Visit Number  2    Number of Visits  4    Date for PT Re-Evaluation  07/11/19    Authorization Type  Medicare    PT Start Time  0932    PT Stop Time  1011    PT Time Calculation (min)  39 min    Activity Tolerance  Patient tolerated treatment well    Behavior During Therapy  [facility] for tasks assessed/performed       Past Medical History:  Diagnosis Date  . AAA (abdominal aortic aneurysm) ([place]) 2020   3.2 cm in 2020   . Atrial flutter ([place])   . Dyslipidemia   . OSA on CPAP 01/20/2014  . Paroxysmal atrial fibrillation [facility])     Past Surgical History:  Procedure Laterality Date  . A-FLUTTER ABLATION N/A 03/22/2017   Procedure: A-FLUTTER ABLATION;  Surgeon: Thompson Grayer, MD;  Location: [facility];  Service: Cardiovascular;  Laterality: N/A;  . CRANIOTOMY Right 09/04/2017   Procedure: CRANIOTOMY FOR SUBDURAL HEMATOMA;  Surgeon: Erline Levine, MD;  Location: [facility];  Service: Neurosurgery;  Laterality: Right;    There were no vitals filed for this visit.  Subjective Assessment - 07/02/19 0938    Subjective  After evaluation pt felt dizzy the rest of the day and the next day.  Has continued to feel some dizziness and wanted to be rechecked.    Pertinent History  AAA, atrial flutter with ablation, dyslipidemia, OSA with CPAP, Afib, craniotomy for SDH, seizures    Patient Stated Goals  To not be dizzy when he wakes up in the morning    Currently in Pain?  No/denies             Vestibular Assessment - 07/02/19 0939      Positional Testing   Dix-Hallpike  Dix-Hallpike Left      Dix-Hallpike  Right   Dix-Hallpike Right Duration  0    Dix-Hallpike Right Symptoms  No nystagmus      Dix-Hallpike Left   Dix-Hallpike Left Duration  0    Dix-Hallpike Left Symptoms  No nystagmus      Horizontal Canal Right   Horizontal Canal Right Duration  0    Horizontal Canal Right Symptoms  Normal      Horizontal Canal Left   Horizontal Canal Left Duration  0    Horizontal Canal Left Symptoms  Normal      Positional Sensitivities   Sit to Supine  Lightheadedness    Supine to Left Side  Lightheadedness    Supine to Right Side  Lightheadedness    Supine to Sitting  Lightheadedness    Right Hallpike  Lightheadedness    Up from Right Hallpike  Lightheadedness    Up from Left Hallpike  Lightheadedness    Rolling Right  Lightheadedness    Rolling Left  Lightheadedness                Vestibular Treatment/Exercise - 07/02/19 0945      Vestibular Treatment/Exercise   Vestibular Treatment Provided  Habituation;Gaze    Habituation Exercises  Nestor Lewandowsky    Gaze Exercises  X1 Viewing Horizontal;X1 Viewing Vertical      Nestor Lewandowsky   Number of Reps   3    Symptom Description   mild symptoms      X1 Viewing Horizontal   Foot Position  standing feet apart    Reps  3    Comments  30 seconds, using metronome for sequencing.  No dizziness but blurring of target      X1 Viewing Vertical   Foot Position  standing feet apart    Reps  3    Comments  30 seconds, using metronome for sequencing.  No dizziness but blurring of target         Habituation - Sit to Side-Lying   Sit on edge of bed. Lie down onto the right side and hold until dizziness stops, plus 20 seconds.  Return to sitting and wait until dizziness stops, plus 20 seconds.  Repeat to the left side. Repeat sequence 3 times per session. Do 2 sessions per day. Make sure your symptoms stay mild.  If they become more moderate or severe, stop.  Gaze Stabilization - Tip Card  1.Target must remain in focus, not blurry,  and appear stationary while head is in motion. 2.Perform exercises with small head movements (45 to either side of midline). 3.Increase speed of head motion so long as target is in focus. 4.If you wear eyeglasses, be sure you can see target through lens (therapist will give specific instructions for bifocal / progressive lenses). 5.These exercises may provoke dizziness or nausea. Work through these symptoms. If too dizzy, slow head movement slightly. Rest between each exercise. 6.Exercises demand concentration; avoid distractions. 7.For safety, perform standing exercises close to a counter, wall, corner, or next to someone.  Copyright  VHI. All rights reserved.   Gaze Stabilization - Standing Feet Apart   Set a metronome for 100-120 beats per minute.  Set a timer for 30 seconds. Feet shoulder width apart, keeping eyes on target on wall 3 feet away, tilt head down slightly and move head side to side for 30 seconds. Repeat while moving head up and down for 30 seconds.   Do 2-3 sessions per day.             PT Education - 07/02/19 1011    Education Details  HEP for habituation and gaze adaptation    Person(s) Educated  Patient    Methods  Explanation;Demonstration;Handout    Comprehension  Verbalized understanding;Returned demonstration          PT Long Term Goals - 06/11/19 1026      PT LONG TERM GOAL #1   Title  Pt will demonstrate negative positional testing and report no dizziness with rolling and supine <> sit    Time  4    Period  Weeks    Status  New    Target Date  07/11/19      PT LONG TERM GOAL #2   Title  Pt will demonstrate ability to independently perform home CRM to address reoccurence of BPPV.    Time  4    Period  Weeks    Status  New    Target Date  07/11/19      PT LONG TERM GOAL #3   Title  Pt will report improvement in overall function to >95% on FOTO and 18 point decrease in [employer]    Baseline  93%, DHI: 18    Time  4    Period  Weeks  Status  New    Target Date  07/11/19            Plan - 07/02/19 1013    Clinical Impression Statement  Patient demonstrates full resolution of vertigo and BPPV today but continues to experience motion sensitivity and impaired gaze stabilization. Provided HEP to address.  Pt to return if BPPV reoccurs.    Personal Factors and Comorbidities  Behavior Pattern;Comorbidity 3+;Past/Current Experience    Comorbidities  AAA, atrial flutter with ablation, dyslipidemia, OSA with CPAP, Afib, craniotomy for SDH, seizures    Examination-Activity Limitations  Bed Mobility;Bend    [employer] Activity;Other   golf   Stability/Clinical Decision Making  Stable/Uncomplicated    Rehab Potential  Excellent    PT Frequency  1x / week    PT Duration  4 weeks    PT Treatment/Interventions  ADLs/Self Care Home Management;Canalith Repostioning;Functional mobility training;Therapeutic activities;Therapeutic exercise;Balance training;Neuromuscular re-education;Patient/family education;Vestibular    PT Next Visit Plan  assess BPPV, Redo FOTO and D/C if returns    Consulted and Agree with Plan of Care  Patient       Patient will benefit from skilled therapeutic intervention in order to improve the following deficits and impairments:  Dizziness  Visit Diagnosis: BPPV (benign paroxysmal positional vertigo), left  Dizziness and giddiness     Problem List Patient Active Problem List   Diagnosis Date Noted  . Bacteriuria with pyuria 09/05/2017  . Status post surgery 09/04/2017  . Subacute subdural hematoma ([place]) 09/04/2017  . Seizure ([place]) 09/04/2017  . Left hemiparesis ([place]) 09/04/2017  . Travel advice encounter 11/27/2014  . OSA on CPAP 01/20/2014  . Snoring 10/15/2013  . Witnessed apneic spells 10/15/2013  . Typical atrial flutter ([place]) 09/29/2013  . Erectile dysfunction 01/18/2012  . Benign fasciculations 01/18/2012  . Dyslipidemia 01/07/2007  . 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

## 2019-07-02 NOTE — Patient Instructions (Signed)
Habituation - Sit to Side-Lying   Sit on edge of bed. Lie down onto the right side and hold until dizziness stops, plus 20 seconds.  Return to sitting and wait until dizziness stops, plus 20 seconds.  Repeat to the left side. Repeat sequence 3 times per session. Do 2 sessions per day. Make sure your symptoms stay mild.  If they become more moderate or severe, stop.  Gaze Stabilization - Tip Card  1.Target must remain in focus, not blurry, and appear stationary while head is in motion. 2.Perform exercises with small head movements (45 to either side of midline). 3.Increase speed of head motion so long as target is in focus. 4.If you wear eyeglasses, be sure you can see target through lens (therapist will give specific instructions for bifocal / progressive lenses). 5.These exercises may provoke dizziness or nausea. Work through these symptoms. If too dizzy, slow head movement slightly. Rest between each exercise. 6.Exercises demand concentration; avoid distractions. 7.For safety, perform standing exercises close to a counter, wall, corner, or next to someone.  Copyright  VHI. All rights reserved.   Gaze Stabilization - Standing Feet Apart   Set a metronome for 100-120 beats per minute.  Set a timer for 30 seconds. Feet shoulder width apart, keeping eyes on target on wall 3 feet away, tilt head down slightly and move head side to side for 30 seconds. Repeat while moving head up and down for 30 seconds.   Do 2-3 sessions per day.

## 2019-07-11 IMAGING — CT CT HEAD W/O CM
4 series · 16 of 47 positions shown, 18 images · non-contrast
Comparison: 09/04/2017

CLINICAL DATA: Followup subdural evacuation. Right-sided facial
droop. Not moving the right side.

EXAM:
CT HEAD WITHOUT CONTRAST
TECHNIQUE: Contiguous axial images were obtained from the base of the skull
through the vertex without intravenous contrast.

[Series 3: head without · axial · non-contrast · 0.43mm/px · z∈[-93,+32]mm · 7 of 35 slices shown, 9 images]
[im 5/35  brain]
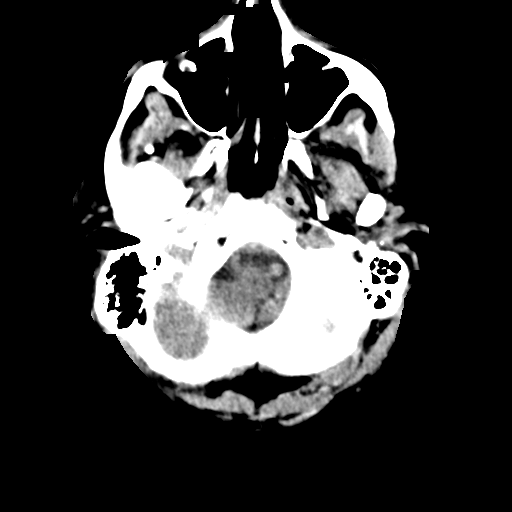
[im 5/35  bone]
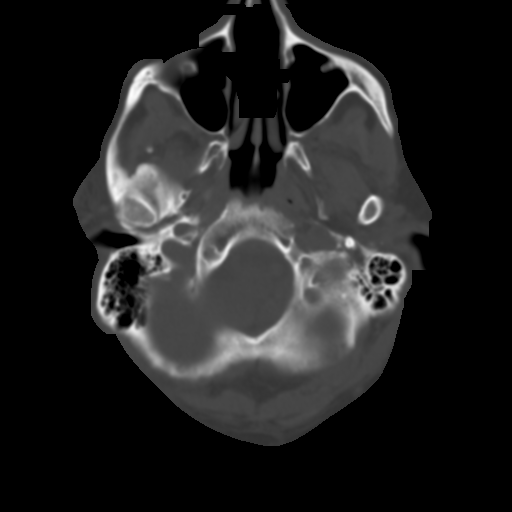
[im 9/35  brain]
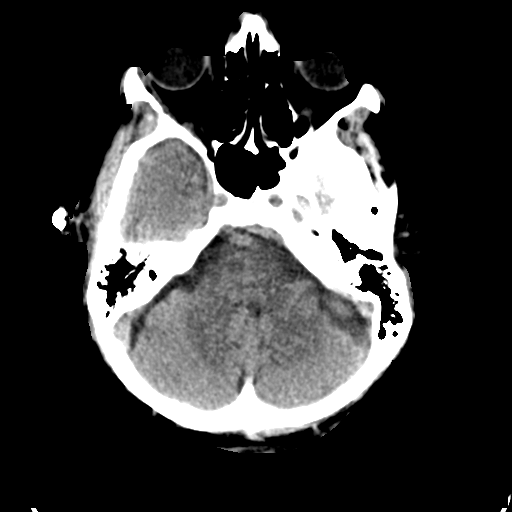
[im 13/35  brain]
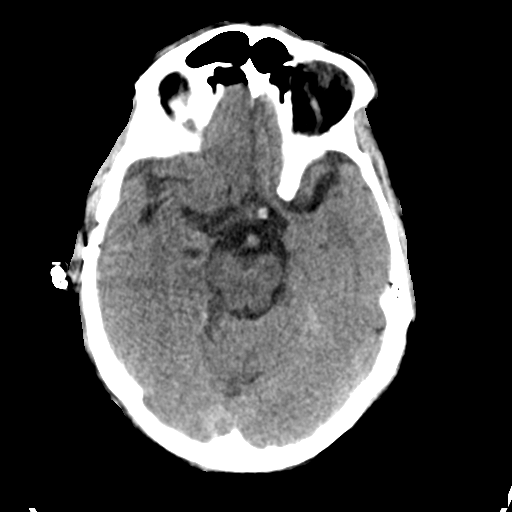
[im 18/35  brain]
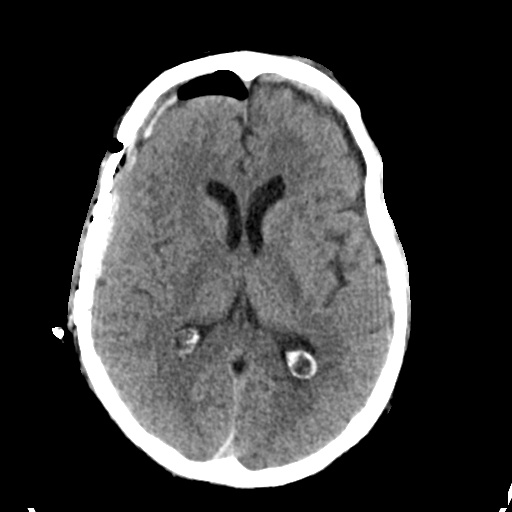
[im 22/35  brain]
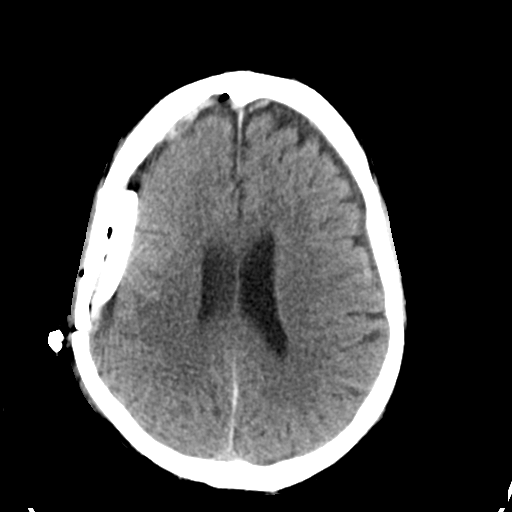
[im 22/35  bone]
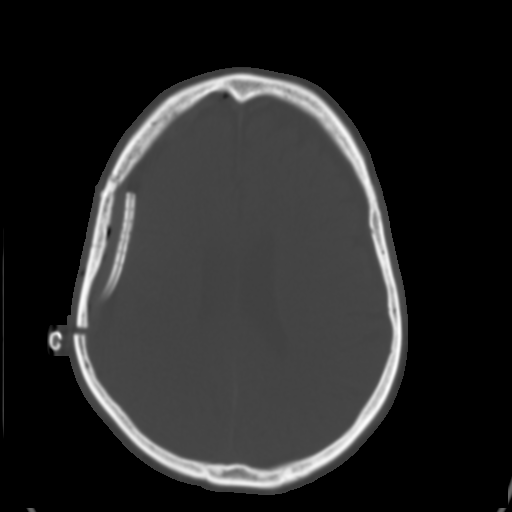
[im 26/35  brain]
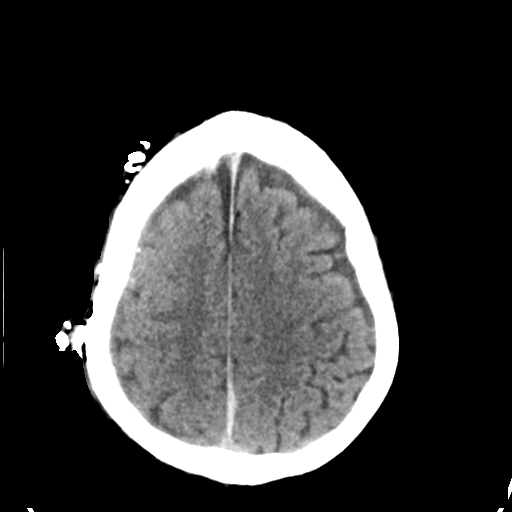
[im 30/35  brain]
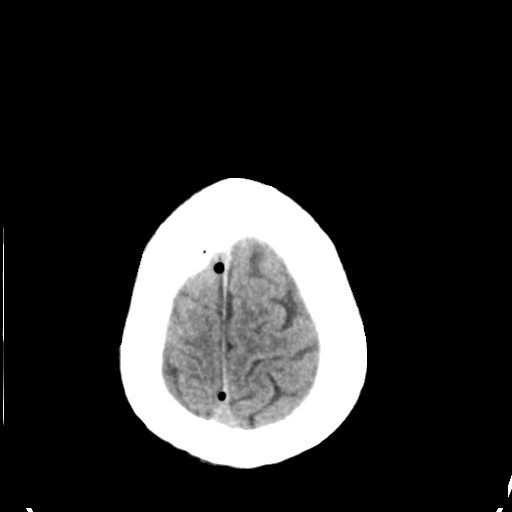

[Series 4: head bone · axial · 0.43mm/px · z∈[-97,-61]mm · 3 of 88 slices shown]
[im 9/88  bone]
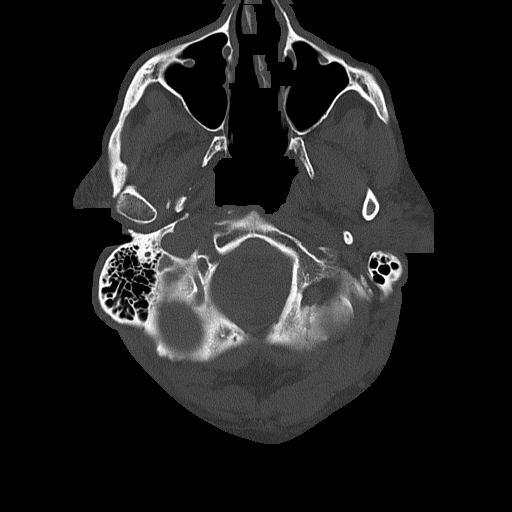
[im 18/88  bone]
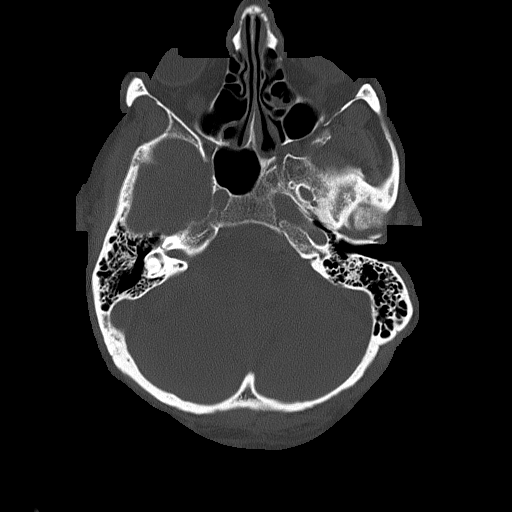
[im 27/88  bone]
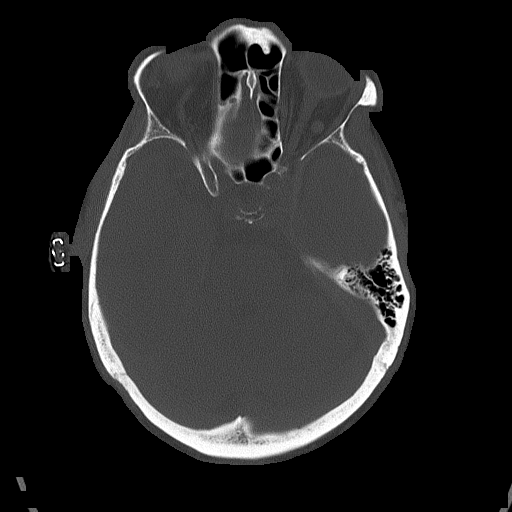

[Series 5: head without cor · coronal · non-contrast · 0.34mm/px · 3 of 68 slices shown]
[im 23/68  brain]
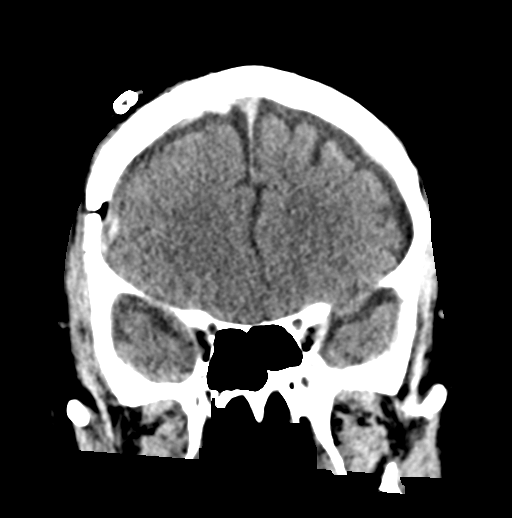
[im 30/68  brain]
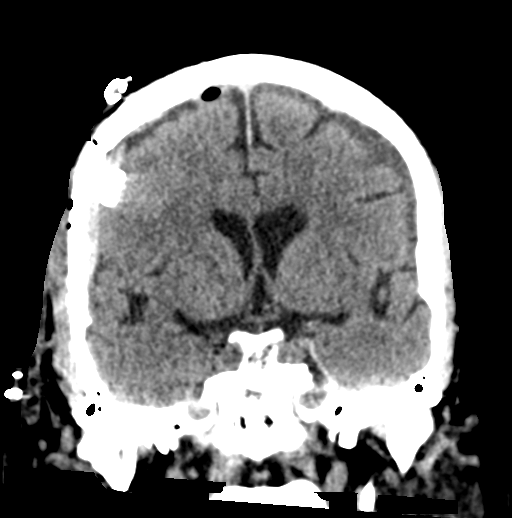
[im 38/68  brain]
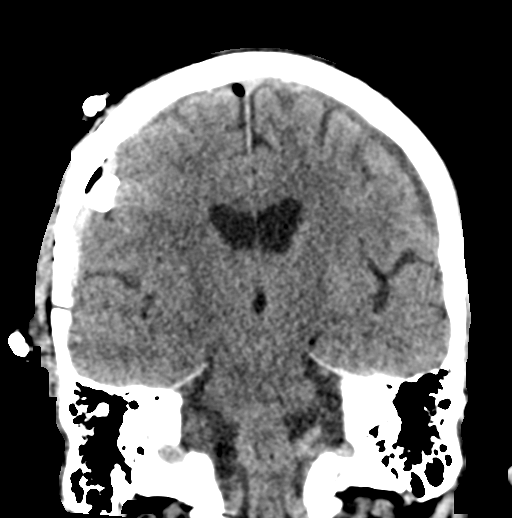

[Series 6: head without sag · sagittal · non-contrast · 0.34mm/px · 3 of 53 slices shown]
[im 18/53  brain]
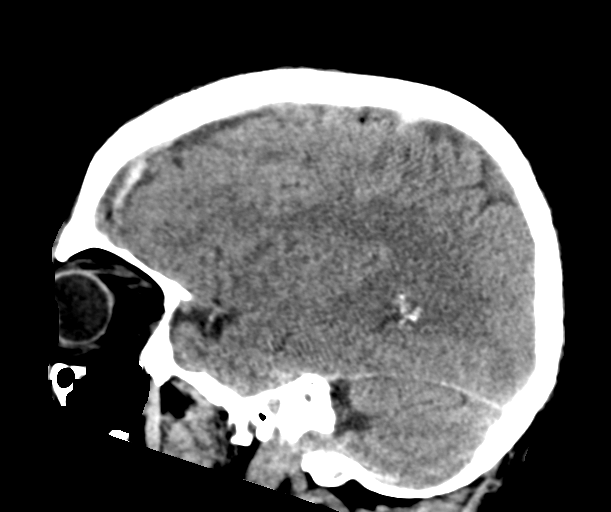
[im 27/53  brain]
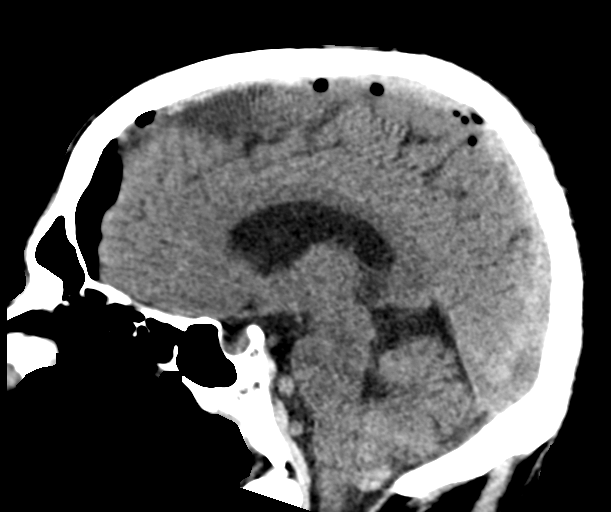
[im 35/53  brain]
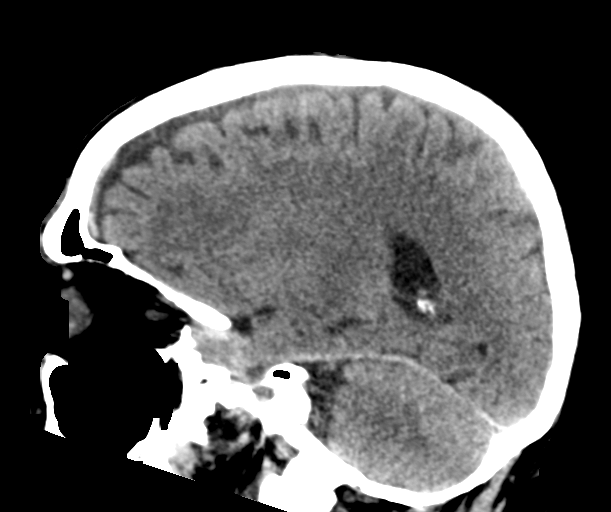

[16 of 47 positions shown; findings below may reference images not displayed]

FINDINGS: Brain: Previous right craniotomy for vacuo a shin of subdural
hematoma. Subdural drain in place presently. Reduced thickness of
residual subdural blood and fluid on the right, maximal at about 6
mm in the frontal region. Small amount of right-sided subdural air.
Small amount of subdural blood remains evident along the left side
of the falx the left frontal convexity, maximal thickness about 5
mm. Small amount of subdural blood also along the clivus and
anteriorly at the foramen magnum. This does not appear to have
mass-effect upon brain appear slightly diminished compared to the
study of 2 days ago. No new or increasing subdural collection.
Reduced mass effect without any right-to-left shift presently. No
evidence of brain infarction or intraparenchymal hemorrhage. No
hydrocephalus.

Vascular: No abnormal vascular finding.

Skull: Postoperative changes as above. Chronic temporomandibular
joint degenerative changes.

Sinuses/Orbits: Normal sinuses. Small venous varix in the left orbit
as noted previously.

Other: None
IMPRESSION: Interval right-sided craniotomy for subdural hematoma evacuation.
Subdural drain in place. Maximal residual component 6 mm in the
right frontal region. No evidence of recurring collection. Small
amount of subdural air as expected. Reduced mass effect with
resolution of right-to-left shift.

Small amount of subdural blood along the left falx, along the left
frontal convexity along the clivus and anterior foramen magnum again
demonstrated. Posterior fossa subdural blood appears slightly
diminished.

No evidence of stroke or other complication based on the CT.

## 2019-09-23 ENCOUNTER — Ambulatory Visit (INDEPENDENT_AMBULATORY_CARE_PROVIDER_SITE_OTHER): Payer: Medicare Other | Admitting: Neurology

## 2019-09-23 ENCOUNTER — Encounter: Payer: Self-pay | Admitting: Neurology

## 2019-09-23 ENCOUNTER — Other Ambulatory Visit: Payer: Self-pay

## 2019-09-23 VITALS — BP 121/79 | HR 61 | Wt 188.0 lb

## 2019-09-23 DIAGNOSIS — R561 Post traumatic seizures: Secondary | ICD-10-CM

## 2019-09-23 NOTE — Progress Notes (Signed)
NEUROLOGY FOLLOW UP OFFICE NOTE  Steven Byrd [id number] 01/27/1956  HISTORY OF PRESENT ILLNESS: I had the pleasure of seeing Steven Byrd in follow-up in the neurology clinic on 09/23/2019.  The patient was last seen 4 months ago for seizures secondary to subdural hematoma. His significant other Steven Byrd is again present to provide additional information. He had an initial GTC in 09/2017 followed by recurrent episodes of transient aphasia. Symptoms resolved with initiation of oxcarbazepine, none since 09/19/2017. He is on oxcarbazepine 300mg  1 tab in AM, 2 tabs in PM and expresses continued interest in weaning off medication since he is now seizure-free for 2 years. He and Steven Byrd deny any further aphasic episodes, no staring/unresponsive events, focal numbness/tingling/weakness, myoclonic jerks. No headaches, no falls. The dizziness had resolved with vestibular therapy. He is looking forward to retirement next month.  History on Initial Assessment 09/17/2017: This is a pleasant 64 year old man with a history of hyperlipidemia, atrial flutter s/p ablation, presenting for new onset seizures that occurred in the setting of a subdural hematoma. He had fallen last May 2019 then 3 weeks later on 09/04/17, his friend came to his house and no one was answering the door. They broke through the door and found him under his desk face down convulsing until EMS arrived. There was note on admission that he had several other witnessed seizures and received several doses of Versed. He was brought the [facility] where head CT showed an acute mixed density subdural hematoma overlying the right cerebral convexity up to 50mm, with additional small acute subdural hemorrhage overlying the left frontal convexity up to 35mm. There was extension along the falx and left tentorium with blood seen at the foramen magnum, mass effect with 86mm right to left shift. He underwent emergent right craniotomy for evacuation. He initially had left-sided  weakness with significant improvement on hospital discharge on 09/10/17. He was back in the ER the next day after an episode of sudden inability to get his words out. This lasted 4-5 minutes. He was aware throughout and could think of the word but could not say it. No focal symptoms, no associated headache. Repeat head CT showed slightly decreased right subdural hematoma size, post-surgical changes, stable left frontal subdural hematoma, no midline shift. Keppra dose was increased to 1000mg  BID. Since then, he has had several more similar episode of transient aphasia, he had one on Friday, 2 on Saturday, then one last Sunday. He would be visibly upset because he cannot articulate his words. He has been more tired on the higher dose of Keppra, and his significant other is concerned about an itchy rash on his back and abdomen.   He and his significant other deny any staring/unresponsive episodes, no gaps in time, olfactory/gustatory hallucinations, deja vu, rising epigastric sensation, focal numbness/tingling/weakness, myoclonic jerks.[name]   Epilepsy Risk Factors:  Bilateral subdural hematoma s/p fall R>L, s/p right craniotomy. Otherwise he had a normal birth and early development.  There is no history of febrile convulsions, CNS infections such as meningitis/encephalitis.  Diagnostic Data: 1-hour EEG in 03/2018 showed occasional right temporal slowing, breach artifact over the right centrotemporoparietal region   PAST MEDICAL HISTORY: Past Medical History:  Diagnosis Date  . AAA (abdominal aortic aneurysm) ([place]) 2020   3.2 cm in 2020   . Atrial flutter ([place])   . Dyslipidemia   . OSA on CPAP 01/20/2014  . Paroxysmal atrial fibrillation [facility])     MEDICATIONS: Current Outpatient Medications on File Prior to Visit  Medication Sig Dispense Refill  . acyclovir (ZOVIRAX) 800 MG tablet TAKE 1 TABLET (800 MG TOTAL) BY MOUTH 3 (THREE) TIMES DAILY AS NEEDED. 30 tablet 3  . atorvastatin (LIPITOR) 40 MG  tablet Take 1 tablet (40 mg total) by mouth daily. 90 tablet 3  . CALCIUM-MAGNESIUM-ZINC PO Take 1 capsule by mouth daily.    . cholecalciferol (VITAMIN D) 1000 units tablet Take 1,000 Units by mouth daily.    . Coenzyme Q10 (CO Q 10 PO) Take 1 capsule by mouth daily.    . Multiple Vitamin (MULTIVITAMIN) capsule Take 1 capsule by mouth daily.    . Oxcarbazepine (TRILEPTAL) 300 MG tablet Take one tablet in the morning and two tablets in the evening 270 tablet 3  . Probiotic Product (PROBIOTIC-10) CAPS Take by mouth.    . simvastatin (ZOCOR) 40 MG tablet TAKE 1 TABLET BY MOUTH EVERYDAY AT BEDTIME 90 tablet 0  . sildenafil (VIAGRA) 100 MG tablet Take 1 tablet (100 mg total) by mouth daily as needed for erectile dysfunction. (Patient not taking: Reported on 09/23/2019) 24 tablet 3   No current facility-administered medications on file prior to visit.    ALLERGIES: Allergies  Allergen Reactions  . Ativan [Lorazepam]     irritable and agitated    FAMILY HISTORY: Family History  Problem Relation Age of Onset  . Diabetes Father   . Stroke Father   . Hypertension Mother        ?  [name] Heart attack Maternal Grandfather   . Colon cancer Neg Hx     SOCIAL HISTORY: Social History   Socioeconomic History  . Marital status: Divorced    Spouse name: Not on file  . Number of children: Not on file  . Years of education: Not on file  . Highest education level: Not on file  Occupational History  . Not on file  Tobacco Use  . Smoking status: Former Smoker    Quit date: 10/16/1998    Years since quitting: 20.9  . Smokeless tobacco: Never Used  Vaping Use  . Vaping Use: Never used  Substance and Sexual Activity  . Alcohol use: Yes    Alcohol/week: 14.0 - 21.0 standard drinks    Types: 14 - 21 Glasses of wine per week    Comment: red wine  . Drug use: No  . Sexual activity: Yes    Partners: Female  Other Topics Concern  . Not on file  Social History Narrative   Has a Licensed conveyancer  company since 1978      Daughter who is 71 and a Son who is 56   - Daughter in [place], Son is in [place].       He likes to hike, scuba dive, and travel      Lives in single story home    Steven Byrd - significant other   4 year degree   Right handed    Social Determinants of Health   Financial Resource Strain:   . Difficulty of Paying Living Expenses:   Food Insecurity:   . Worried About Charity fundraiser in the Last Year:   . Arboriculturist in the Last Year:   Transportation Needs:   . Film/video editor (Medical):   [name] Lack of Transportation (Non-Medical):   Physical Activity:   . Days of Exercise per Week:   . Minutes of Exercise per Session:   Stress:   . Feeling of Stress :   Social Connections:   .  Frequency of Communication with Friends and Family:   . Frequency of Social Gatherings with Friends and Family:   . Attends Religious Services:   . Active Member of Clubs or Organizations:   . Attends Archivist Meetings:   [name] Marital Status:   Intimate Partner Violence:   . Fear of Current or Ex-Partner:   . Emotionally Abused:   [name] Physically Abused:   . Sexually Abused:      PHYSICAL EXAM: Vitals:   09/23/19 1403  BP: 121/79  Pulse: 61  SpO2: 98%   General: No acute distress Head:  Normocephalic/atraumatic Skin/Extremities: No rash, no edema Neurological Exam: alert and oriented to person, place, and time. No aphasia or dysarthria. Fund of knowledge is appropriate.  Recent and remote memory are intact.  Attention and concentration are normal.    Cranial nerves: Pupils equal, round, reactive to light. Extraocular movements intact with no nystagmus. Visual fields full. No facial asymmetry.   Motor: Bulk and tone normal, muscle strength 5/5 throughout with no pronator drift. Finger to nose testing intact with mild endpoint tremor on right hand.  Gait narrow-based and steady, mild difficulty with tandem walk.  IMPRESSION: This is a pleasant 64 yo RH man  with a history of history of hyperlipidemia, atrial flutter s/p ablation, who presented with a GTC on 09/04/17, found to have a subdural hematoma from fall 3 weeks prior. He underwent right craniotomy, repeat head imaging did not show any further residual subdural hematoma.He had episodes of transient aphasia that resolved with initiation of oxcarbazepine, none since 09/2017. He remains interested in weaning off medication now that he is 2-years seizure-free. Repeat 1-hour EEG will be done, if unremarkable, we will start weaning off Oxcarbazepine. We discussed risks of breakthrough seizure with any medication adjustment, they know to call for any changes. He is aware of [place] driving laws to stop driving after a seizure, until 6 months seizure-free. He will follow-up in 6 months and knows to call for any changes.    Thank you for allowing me to participate in his care.  Please do not hesitate to call for any questions or concerns.   Ellouise Newer, M.D.   CC: Dorothyann Peng, NP

## 2019-09-23 NOTE — Patient Instructions (Signed)
1. Schedule 1-hour EEG  2. Our office will call you with EEG results, if normal, start reducing the Trileptal to 1 tab twice a day for a week, then 1 tab every night for 1 week, then stop. Call for any change in symptoms  3. Follow-up in 6 months, call for any changes  Seizure Precautions: 1. If medication has been prescribed for you to prevent seizures, take it exactly as directed.  Do not stop taking the medicine without talking to your doctor first, even if you have not had a seizure in a long time.   2. Avoid activities in which a seizure would cause danger to yourself or to others.  Don't operate dangerous machinery, swim alone, or climb in high or dangerous places, such as on ladders, roofs, or girders.  Do not drive unless your doctor says you may.  3. If you have any warning that you may have a seizure, lay down in a safe place where you can't hurt yourself.    4.  No driving for 6 months from last seizure, as per [facility].   Please refer to the following link on the Door website for more information: http://www.epilepsyfoundation.org/answerplace/Social/driving/drivingu.cfm   5.  Maintain good sleep hygiene. Avoid alcohol.  6.  Contact your doctor if you have any problems that may be related to the medicine you are taking.  7.  Call 911 and bring the patient back to the ED if:        A.  The seizure lasts longer than 5 minutes.       B.  The patient doesn't awaken shortly after the seizure  C.  The patient has new problems such as difficulty seeing, speaking or moving  D.  The patient was injured during the seizure  E.  The patient has a temperature over 102 F (39C)  F.  The patient vomited and now is having trouble breathing

## 2019-10-01 ENCOUNTER — Ambulatory Visit (INDEPENDENT_AMBULATORY_CARE_PROVIDER_SITE_OTHER): Payer: Medicare Other | Admitting: Neurology

## 2019-10-01 ENCOUNTER — Other Ambulatory Visit: Payer: Self-pay

## 2019-10-01 DIAGNOSIS — R561 Post traumatic seizures: Secondary | ICD-10-CM

## 2019-10-14 ENCOUNTER — Telehealth: Payer: Self-pay | Admitting: Neurology

## 2019-10-14 NOTE — Telephone Encounter (Signed)
Patient called in and left a message wanting to get his EEG results.

## 2019-10-14 NOTE — Telephone Encounter (Signed)
Spoke to patient. EEG did not show any epileptiform discharges. EEG similar to 2020 and 2019 with changes seen post-craniotomy. He would like to proceed with weaning off Trileptal, went over weaning instructions. He is aware of risk of breakthrough seizure with any medication adjustment and knows to call for any change in symptoms. F/u as scheduled.

## 2019-10-14 NOTE — Procedures (Signed)
ELECTROENCEPHALOGRAM REPORT  Date of Study: 10/01/2019  Patient's Name: Steven Byrd MRN: [id number] Date of Birth: 12/11/1955  Referring Provider: Dr. Ellouise Newer  Clinical History: This is a 64 year old man with history of subdural hematoma s/p right craniotomy with recurrent episodes of transient aphasia, seizure-free since 2019.  Medications: TRILEPTAL 300 MG tablet ZOVIRAX 800 MG tablet LIPITOR 40 MG tablet CALCIUM-MAGNESIUM-ZINC PO VITAMIN D 1000 units tablet CO Q 10 PO MULTIVITAMIN capsule PROBIOTIC-10 CAPS ZOCOR 40 MG tablet VIAGRA 100 MG tablet  Technical Summary: A multichannel digital 1-hour EEG recording measured by the international 10-20 system with electrodes applied with paste and impedances below 5000 ohms performed in our laboratory with EKG monitoring in an awake and asleep patient.  Hyperventilation was not performed. Photic stimulation was performed.  The digital EEG was referentially recorded, reformatted, and digitally filtered in a variety of bipolar and referential montages for optimal display.    Description: The patient is awake and asleep during the recording.  During maximal wakefulness, there is a symmetric, medium voltage 9 Hz posterior dominant rhythm that attenuates with eye opening.  There is occasional focal theta slowing seen over the right temporal region. Breach artifact with higher amplitude and sharply contoured activity is seen over the right centrotemporal region. During drowsiness and sleep, there is an increase in theta slowing of the background.  Vertex waves and symmetric sleep spindles were seen.  Photic stimulation did not elicit any abnormalities.  There were no epileptiform discharges or electrographic seizures seen.    EKG lead was unremarkable.  Impression: This 1-hour awake and asleep EEG is abnormal due to the presence of: 1. Occasional focal slowing over the right temporal region 2. Breach artifact over the right  centrotemporal region  Clinical Correlation of the above findings indicates focal cerebral dysfunction over the right temporal region suggestive of underlying structural or physiologic abnormality. Breach artifact consistent with prior surgery in this region. The absence of epileptiform discharges does not exclude a clinical diagnosis of epilepsy.  If further clinical questions remain, prolonged EEG may be helpful.  Clinical correlation is advised.   Ellouise Newer, M.D.

## 2019-10-20 ENCOUNTER — Encounter: Payer: Self-pay | Admitting: Physical Therapy

## 2019-10-20 NOTE — Therapy (Signed)
[facility] [address] [place], [place], [address] Phone: [phone]   Fax:  [phone]  Patient Details  Name: Steven Byrd MRN: [id number] Date of Birth: Feb 22, 1956 Referring Provider:  No ref. provider found  Encounter Date: 10/20/2019 PHYSICAL THERAPY DISCHARGE SUMMARY  Visits from Start of Care: 2  Current functional level related to goals / functional outcomes: Unable to formally assess but pt did experience full resolution of BPPV and did not return after 2nd visit.     Remaining deficits: Mild motion sensitivity   Education / Equipment: HEP  Plan: Patient agrees to discharge.  Patient goals were not met. Patient is being discharged due to not returning since the last visit.  ?????     Rico Junker, PT, DPT 10/20/19    2:21 PM  [facility] [address] [place], [place], [address] Phone: [phone]   Fax:  [phone]

## 2019-11-09 ENCOUNTER — Other Ambulatory Visit: Payer: Self-pay | Admitting: Adult Health

## 2019-11-12 DIAGNOSIS — L578 Other skin changes due to chronic exposure to nonionizing radiation: Secondary | ICD-10-CM | POA: Diagnosis not present

## 2019-11-12 DIAGNOSIS — L821 Other seborrheic keratosis: Secondary | ICD-10-CM | POA: Diagnosis not present

## 2019-11-12 DIAGNOSIS — D485 Neoplasm of uncertain behavior of skin: Secondary | ICD-10-CM | POA: Diagnosis not present

## 2019-11-12 DIAGNOSIS — W57XXXA Bitten or stung by nonvenomous insect and other nonvenomous arthropods, initial encounter: Secondary | ICD-10-CM | POA: Diagnosis not present

## 2019-11-12 DIAGNOSIS — L814 Other melanin hyperpigmentation: Secondary | ICD-10-CM | POA: Diagnosis not present

## 2019-11-12 DIAGNOSIS — D225 Melanocytic nevi of trunk: Secondary | ICD-10-CM | POA: Diagnosis not present

## 2019-11-12 DIAGNOSIS — L570 Actinic keratosis: Secondary | ICD-10-CM | POA: Diagnosis not present

## 2019-11-12 DIAGNOSIS — Z85828 Personal history of other malignant neoplasm of skin: Secondary | ICD-10-CM | POA: Diagnosis not present

## 2019-11-12 DIAGNOSIS — D045 Carcinoma in situ of skin of trunk: Secondary | ICD-10-CM | POA: Diagnosis not present

## 2019-11-18 DIAGNOSIS — H401131 Primary open-angle glaucoma, bilateral, mild stage: Secondary | ICD-10-CM | POA: Diagnosis not present

## 2019-11-18 DIAGNOSIS — H04123 Dry eye syndrome of bilateral lacrimal glands: Secondary | ICD-10-CM | POA: Diagnosis not present

## 2019-11-18 DIAGNOSIS — H25013 Cortical age-related cataract, bilateral: Secondary | ICD-10-CM | POA: Diagnosis not present

## 2019-11-18 DIAGNOSIS — H2513 Age-related nuclear cataract, bilateral: Secondary | ICD-10-CM | POA: Diagnosis not present

## 2019-11-25 ENCOUNTER — Encounter: Payer: Self-pay | Admitting: Adult Health

## 2019-11-25 ENCOUNTER — Other Ambulatory Visit: Payer: Self-pay

## 2019-11-25 ENCOUNTER — Ambulatory Visit (INDEPENDENT_AMBULATORY_CARE_PROVIDER_SITE_OTHER): Payer: Medicare Other | Admitting: Adult Health

## 2019-11-25 VITALS — BP 110/76 | HR 57 | Temp 98.1°F | Ht 73.0 in | Wt 186.0 lb

## 2019-11-25 DIAGNOSIS — Z23 Encounter for immunization: Secondary | ICD-10-CM

## 2019-11-25 DIAGNOSIS — R569 Unspecified convulsions: Secondary | ICD-10-CM

## 2019-11-25 DIAGNOSIS — R351 Nocturia: Secondary | ICD-10-CM

## 2019-11-25 DIAGNOSIS — N401 Enlarged prostate with lower urinary tract symptoms: Secondary | ICD-10-CM

## 2019-11-25 DIAGNOSIS — F5102 Adjustment insomnia: Secondary | ICD-10-CM | POA: Diagnosis not present

## 2019-11-25 DIAGNOSIS — E785 Hyperlipidemia, unspecified: Secondary | ICD-10-CM

## 2019-11-25 DIAGNOSIS — Z9989 Dependence on other enabling machines and devices: Secondary | ICD-10-CM | POA: Diagnosis not present

## 2019-11-25 DIAGNOSIS — G4733 Obstructive sleep apnea (adult) (pediatric): Secondary | ICD-10-CM

## 2019-11-25 MED ORDER — DIAZEPAM 5 MG PO TABS: 5.0000 mg | ORAL_TABLET | Freq: Every evening | ORAL | 1 refills | Status: DC | PRN

## 2019-11-25 NOTE — Addendum Note (Signed)
Addended by: Wyvonne Lenz on: 11/25/2019 08:46 AM   Modules accepted: Orders

## 2019-11-25 NOTE — Patient Instructions (Signed)
It was great seeing you today   We will follow up with you regarding your blood work   Continue to do what you are doing.   I will see you back in one year or sooner if needed

## 2019-11-25 NOTE — Progress Notes (Signed)
Subjective:    Patient ID: Steven Byrd, male    DOB: 16-May-1955, 64 y.o.   MRN: [id number]  HPI   Patient presents for yearly preventative medicine examination. He is a pleasant 64 year old male who  has a past medical history of AAA (abdominal aortic aneurysm) ([facility]) (2020), Atrial flutter ([facility]), Dyslipidemia, OSA on CPAP (01/20/2014), and Paroxysmal atrial fibrillation ([facility]).  OSA - on CPAP nightly.  Uses on a routine basis and feels as though he gets good sleep.  Denies daytime somnolence  Hyperlipidemia-controlled with Lipitor 40 mg daily.  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  Is followed by neurology. He has weaned himself off Trileptal and has been off of this medication for about a month without any changes.   ED- uses Viagra as needed  Insomnia - takes Valium very infrequently. Needs a refill.    All immunizations and health maintenance protocols were reviewed with the patient and needed orders were placed. He is due for Tdap and Flu  Appropriate screening laboratory values were ordered for the patient including screening of hyperlipidemia, renal function and hepatic function. If indicated by BPH, a PSA was ordered.  Medication reconciliation,  past medical history, social history, problem list and allergies were reviewed in detail with the patient  Goals were established with regard to weight loss, exercise, and  diet in compliance with medications. He continues to exercise and eat  He is up-to-date on routine colon cancer screening, last colonoscopy was in 2015, he is on the 10-year plan  He has no acute issues    Review of Systems  Constitutional: Negative.   HENT: Negative.   Eyes: Negative.   Respiratory:  Negative.   Cardiovascular: Negative.   Gastrointestinal: Negative.   Endocrine: Negative.   Genitourinary: Negative.   Musculoskeletal: Negative.   Skin: Negative.   Allergic/Immunologic: Negative.   Neurological: Negative.   Hematological: Negative.   Psychiatric/Behavioral: Negative.   All other systems reviewed and are negative.  Past Medical History:  Diagnosis Date  . AAA (abdominal aortic aneurysm) ([facility]) 2020   3.2 cm in 2020   . Atrial flutter ([facility])   . Dyslipidemia   . OSA on CPAP 01/20/2014  . Paroxysmal atrial fibrillation ([facility])     Social History   Socioeconomic History  . Marital status: Divorced    Spouse name: Not on file  . Number of children: Not on file  . Years of education: Not on file  . Highest education level: Not on file  Occupational History  . Not on file  Tobacco Use  . Smoking status: Former Smoker    Quit date: 10/16/1998    Years since quitting: 21.1  . Smokeless tobacco: Never Used  Vaping Use  . Vaping Use: Never used  Substance and Sexual Activity  . Alcohol use: Yes    Alcohol/week: 14.0 - 21.0 standard drinks    Types: 14 - 21 Glasses of wine per week    Comment: red wine  . Drug use: No  . Sexual activity: Yes    Partners: Female  Other Topics Concern  . 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       He likes to hike, scuba dive, and travel      Lives in single story home    Steven Byrd - significant other   4 year degree   Right handed    Social Determinants of Health   Financial Resource Strain:   . Difficulty of Paying Living Expenses: Not on file  Food Insecurity:   . Worried About Charity fundraiser in the Last Year: Not on file  . Ran Out of Food in the Last Year: Not on file  Transportation Needs:   . Lack of Transportation (Medical): Not on file  . Lack of Transportation  (Non-Medical): Not on file  Physical Activity:   . Days of Exercise per Week: Not on file  . Minutes of Exercise per Session: Not on file  Stress:   . Feeling of Stress : Not on file  Social Connections:   . Frequency of Communication with Friends and Family: Not on file  . Frequency of Social Gatherings with Friends and Family: Not on file  . Attends Religious Services: Not on file  . Active Member of Clubs or Organizations: Not on file  . Attends Archivist Meetings: Not on file  . Marital Status: Not on file  Intimate Partner Violence:   . Fear of Current or Ex-Partner: Not on file  . Emotionally Abused: Not on file  . Physically Abused: Not on file  . Sexually Abused: Not on file    Past Surgical History:  Procedure Laterality Date  . A-FLUTTER ABLATION N/A 03/22/2017   Procedure: A-FLUTTER ABLATION;  Surgeon: Thompson Grayer, MD;  Location: [facility];  Service: Cardiovascular;  Laterality: N/A;  . CRANIOTOMY Right 09/04/2017   Procedure: CRANIOTOMY FOR SUBDURAL HEMATOMA;  Surgeon: Erline Levine, MD;  Location: [facility];  Service: Neurosurgery;  Laterality: Right;    Family History  Problem Relation Age of Onset  . Diabetes Father   . Stroke Father   . Hypertension Mother        ?  [name] Heart attack Maternal Grandfather   . Colon cancer Neg Hx     Allergies  Allergen Reactions  . Ativan [Lorazepam]     irritable and agitated    Current Outpatient Medications on File Prior to Visit  Medication Sig Dispense Refill  . acyclovir (ZOVIRAX) 800 MG tablet TAKE 1 TABLET (800 MG TOTAL) BY MOUTH 3 (THREE) TIMES DAILY AS NEEDED. 30 tablet 3  . atorvastatin (LIPITOR) 40 MG tablet TAKE 1 TABLET BY MOUTH EVERY DAY 90 tablet 3  . CALCIUM-MAGNESIUM-ZINC PO Take 1 capsule by mouth daily.    . cholecalciferol (VITAMIN D) 1000 units tablet Take 1,000 Units by mouth daily.    . Coenzyme Q10 (CO Q 10 PO) Take 1 capsule by mouth daily.    . Multiple Vitamin (MULTIVITAMIN)  capsule Take 1 capsule by mouth daily.    . Oxcarbazepine (TRILEPTAL) 300 MG tablet Take one tablet in the morning and two tablets in the evening 270 tablet 3  . Probiotic Product (PROBIOTIC-10) CAPS Take by mouth.    . sildenafil (VIAGRA) 100 MG tablet Take 1 tablet (100 mg total) by mouth daily as needed for erectile dysfunction. (Patient not taking: Reported on 09/23/2019) 24 tablet 3  . simvastatin (ZOCOR) 40 MG tablet TAKE 1 TABLET BY MOUTH EVERYDAY AT BEDTIME 90  tablet 0   No current facility-administered medications on file prior to visit.    There were no vitals taken for this visit.      Objective:   Physical Exam Vitals and nursing note reviewed.  Constitutional:      General: He is not in acute distress.    Appearance: Normal appearance. He is well-developed and normal weight.  HENT:     Head: Normocephalic and atraumatic.     Right Ear: Tympanic membrane, ear canal and external ear normal. There is no impacted cerumen.     Left Ear: Tympanic membrane, ear canal and external ear normal. There is no impacted cerumen.     Nose: Nose normal. No congestion or rhinorrhea.     Mouth/Throat:     Mouth: Mucous membranes are moist.     Pharynx: Oropharynx is clear. No oropharyngeal exudate or posterior oropharyngeal erythema.  Eyes:     General:        Right eye: No discharge.        Left eye: No discharge.     Extraocular Movements: Extraocular movements intact.     Conjunctiva/sclera: Conjunctivae normal.     Pupils: Pupils are equal, round, and reactive to light.  Neck:     Vascular: No carotid bruit.     Trachea: No tracheal deviation.  Cardiovascular:     Rate and Rhythm: Normal rate and regular rhythm.     Pulses: Normal pulses.     Heart sounds: Normal heart sounds. No murmur heard.  No friction rub. No gallop.   Pulmonary:     Effort: Pulmonary effort is normal. No respiratory distress.     Breath sounds: Normal breath sounds. No stridor. No wheezing, rhonchi or  rales.  Chest:     Chest wall: No tenderness.  Abdominal:     General: Bowel sounds are normal. There is no distension.     Palpations: Abdomen is soft. There is no mass.     Tenderness: There is no abdominal tenderness. There is no right CVA tenderness, left CVA tenderness, guarding or rebound.     Hernia: No hernia is present.  Musculoskeletal:        General: No swelling, tenderness, deformity or signs of injury. Normal range of motion.     Right lower leg: No edema.     Left lower leg: No edema.  Lymphadenopathy:     Cervical: No cervical adenopathy.  Skin:    General: Skin is warm and dry.     Capillary Refill: Capillary refill takes less than 2 seconds.     Coloration: Skin is not jaundiced or pale.     Findings: No bruising, erythema, lesion or rash.  Neurological:     General: No focal deficit present.     Mental Status: He is alert and oriented to person, place, and time.     Cranial Nerves: No cranial nerve deficit.     Sensory: No sensory deficit.     Motor: No weakness.     Coordination: Coordination normal.     Gait: Gait normal.     Deep Tendon Reflexes: Reflexes normal.  Psychiatric:        Mood and Affect: Mood normal.        Behavior: Behavior normal.        Thought Content: Thought content normal.        Judgment: Judgment normal.       Assessment & Plan:  1. Dyslipidemia - Consider increase  in stain  - CBC with Differential/Platelet; Future - Lipid panel; Future - TSH; Future - CMP with eGFR([facility]); Future  2. Seizure ([facility]) - Follow up with Neurology as directed - CBC with Differential/Platelet; Future - Lipid panel; Future - TSH; Future - CMP with eGFR([facility]); Future  3. OSA on CPAP - Continue with CPAP  4. BPH associated with nocturia  - PSA; Future  5. Transient insomnia  - diazepam (VALIUM) 5 MG tablet; Take 1 tablet (5 mg total) by mouth at bedtime as needed for anxiety.  Dispense: 30 tablet; Refill: 1   Film/video editor

## 2019-11-26 LAB — CBC WITH DIFFERENTIAL/PLATELET
Absolute Monocytes: 525 cells/uL (ref 200–950)
Basophils Absolute: 39 cells/uL (ref 0–200)
Basophils Relative: 0.9 %
Eosinophils Absolute: 172 cells/uL (ref 15–500)
Eosinophils Relative: 4 %
HCT: 39.3 % (ref 38.5–50.0)
Hemoglobin: 13.6 g/dL (ref 13.2–17.1)
Lymphs Abs: 1686 cells/uL (ref 850–3900)
MCH: 34.5 pg — ABNORMAL HIGH (ref 27.0–33.0)
MCHC: 34.6 g/dL (ref 32.0–36.0)
MCV: 99.7 fL (ref 80.0–100.0)
MPV: 11.7 fL (ref 7.5–12.5)
Monocytes Relative: 12.2 %
Neutro Abs: 1879 cells/uL (ref 1500–7800)
Neutrophils Relative %: 43.7 %
Platelets: 189 10*3/uL (ref 140–400)
RBC: 3.94 10*6/uL — ABNORMAL LOW (ref 4.20–5.80)
RDW: 12.5 % (ref 11.0–15.0)
Total Lymphocyte: 39.2 %
WBC: 4.3 10*3/uL (ref 3.8–10.8)

## 2019-11-26 LAB — TSH: TSH: 2.39 mIU/L (ref 0.40–4.50)

## 2019-11-26 LAB — LIPID PANEL
Cholesterol: 158 mg/dL (ref <200)
HDL: 53 mg/dL (ref >=40)
LDL Cholesterol (Calc): 84 mg/dL (calc)
Non-HDL Cholesterol (Calc): 105 mg/dL (calc) (ref <130)
Total CHOL/HDL Ratio: 3 (calc) (ref <5.0)
Triglycerides: 117 mg/dL (ref <150)

## 2019-11-26 LAB — COMPLETE METABOLIC PANEL WITH GFR
AG Ratio: 1.4 (calc) (ref 1.0–2.5)
ALT: 21 U/L (ref 9–46)
AST: 24 U/L (ref 10–35)
Albumin: 4.1 g/dL (ref 3.6–5.1)
Alkaline phosphatase (APISO): 54 U/L (ref 35–144)
BUN: 24 mg/dL (ref 7–25)
CO2: 27 mmol/L (ref 20–32)
Calcium: 9.4 mg/dL (ref 8.6–10.3)
Chloride: 102 mmol/L (ref 98–110)
Creat: 0.92 mg/dL (ref 0.70–1.25)
GFR, Est African American: 100 mL/min/{1.73_m2} (ref >=60)
GFR, Est Non African American: 86 mL/min/{1.73_m2} (ref >=60)
Globulin: 3 g/dL (calc) (ref 1.9–3.7)
Glucose, Bld: 102 mg/dL — ABNORMAL HIGH (ref 65–99)
Potassium: 4.1 mmol/L (ref 3.5–5.3)
Sodium: 137 mmol/L (ref 135–146)
Total Bilirubin: 1.3 mg/dL — ABNORMAL HIGH (ref 0.2–1.2)
Total Protein: 7.1 g/dL (ref 6.1–8.1)

## 2019-11-26 LAB — PSA: PSA: 0.86 ng/mL (ref <=4.0)

## 2019-12-02 DIAGNOSIS — Z23 Encounter for immunization: Secondary | ICD-10-CM | POA: Diagnosis not present

## 2019-12-04 DIAGNOSIS — D045 Carcinoma in situ of skin of trunk: Secondary | ICD-10-CM | POA: Diagnosis not present

## 2019-12-04 DIAGNOSIS — L905 Scar conditions and fibrosis of skin: Secondary | ICD-10-CM | POA: Diagnosis not present

## 2019-12-11 ENCOUNTER — Telehealth: Payer: Self-pay | Admitting: Adult Health

## 2019-12-11 MED ORDER — ATORVASTATIN CALCIUM 40 MG PO TABS: 40.0000 mg | ORAL_TABLET | Freq: Every day | ORAL | 3 refills | Status: DC

## 2019-12-11 NOTE — Telephone Encounter (Signed)
[facility] to [facility] preferences.

## 2019-12-11 NOTE — Telephone Encounter (Signed)
Pt needs Atorvastation called in to a new [facility] on [address]  Please change this to new pharmacy in record- [facility] out.

## 2020-01-05 ENCOUNTER — Telehealth: Payer: Self-pay | Admitting: *Deleted

## 2020-01-05 NOTE — Telephone Encounter (Signed)
Patient called stating he accidentally threw away his Lipitor and he wants to know if he can get another prescription, patient states it was just refilled. Patient also request that his diazepam to be refilled as well. Patient states he is using a new pharmacy to please start sending prescriptions to...     Pharmacy- [address]

## 2020-01-06 ENCOUNTER — Other Ambulatory Visit: Payer: Self-pay | Admitting: Adult Health

## 2020-01-06 DIAGNOSIS — F5102 Adjustment insomnia: Secondary | ICD-10-CM

## 2020-01-06 MED ORDER — DIAZEPAM 5 MG PO TABS: 5.0000 mg | ORAL_TABLET | Freq: Every evening | ORAL | 1 refills | Status: DC | PRN

## 2020-01-06 MED ORDER — ATORVASTATIN CALCIUM 40 MG PO TABS: 40.0000 mg | ORAL_TABLET | Freq: Every day | ORAL | 3 refills | Status: DC

## 2020-01-23 ENCOUNTER — Telehealth: Payer: Self-pay | Admitting: Adult Health

## 2020-01-23 NOTE — Telephone Encounter (Signed)
Pt is calling in stating that his CPAP machine has died and [facility] is suppose to be sending over information so that he can get approved for a new CPAP machine.  If you need to speak with him you can call him at his new number [phone].

## 2020-01-23 NOTE — Telephone Encounter (Signed)
fyi

## 2020-01-26 NOTE — Telephone Encounter (Signed)
Faxed and confirmed

## 2020-01-26 NOTE — Telephone Encounter (Signed)
[facility] Equipment Fax number: [phone]  They need the last office note from [facility] that discuss the CPAP machine so they can fax over the order for the sleep machine replacement.  Please advise

## 2020-03-02 ENCOUNTER — Telehealth: Payer: Self-pay | Admitting: Adult Health

## 2020-03-02 NOTE — Telephone Encounter (Signed)
Tried calling patient to  schedule Medicare Annual Wellness Visit (AWV) either virtually or in office.  No answer   Last AWVI   please schedule at anytime with [facility] Nurse Health Advisor 1 or 2   This should be a 45 minute visit.

## 2020-04-09 ENCOUNTER — Encounter: Payer: Self-pay | Admitting: Neurology

## 2020-04-09 ENCOUNTER — Ambulatory Visit (INDEPENDENT_AMBULATORY_CARE_PROVIDER_SITE_OTHER): Payer: Medicare Other | Admitting: Neurology

## 2020-04-09 ENCOUNTER — Other Ambulatory Visit: Payer: Self-pay

## 2020-04-09 VITALS — BP 144/77 | HR 70 | Ht 73.0 in | Wt 192.6 lb

## 2020-04-09 DIAGNOSIS — R561 Post traumatic seizures: Secondary | ICD-10-CM | POA: Diagnosis not present

## 2020-04-09 NOTE — Patient Instructions (Signed)
Good to see you! Have a safe hike, stay safe. Follow-up as needed, call for any changes   Seizure Precautions: 1. If medication has been prescribed for you to prevent seizures, take it exactly as directed.  Do not stop taking the medicine without talking to your doctor first, even if you have not had a seizure in a long time.   2. Avoid activities in which a seizure would cause danger to yourself or to others.  Don't operate dangerous machinery, swim alone, or climb in high or dangerous places, such as on ladders, roofs, or girders.  Do not drive unless your doctor says you may.  3. If you have any warning that you may have a seizure, lay down in a safe place where you can't hurt yourself.    4.  No driving for 6 months from last seizure, as per [facility].   Please refer to the following link on the [facility] website for more information: http://www.epilepsyfoundation.org/answerplace/Social/driving/drivingu.cfm   5.  Maintain good sleep hygiene. Avoid alcohol.  6.  Contact your doctor if you have any problems that may be related to the medicine you are taking.  7.  Call 911 and bring the patient back to the ED if:        A.  The seizure lasts longer than 5 minutes.       B.  The patient doesn't awaken shortly after the seizure  C.  The patient has new problems such as difficulty seeing, speaking or moving  D.  The patient was injured during the seizure  E.  The patient has a temperature over 102 F (39C)  F.  The patient vomited and now is having trouble breathing

## 2020-04-09 NOTE — Progress Notes (Signed)
NEUROLOGY FOLLOW UP OFFICE NOTE  Steven Byrd [id number] November 18, 1955  HISTORY OF PRESENT ILLNESS: I had the pleasure of seeing Steven Byrd in follow-up in the neurology clinic on 04/09/2020.  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 He is alone in the office today. He had a GTC in 09/2017 followed by recurrent episodes of transient aphasia. Symptoms resolved with initiation of oxcarbazepine, seizure-free since 09/2017. He expressed interest in weaning off medication after being seizure-free for 2 years, his EEG in July 2021 showed occasional focal slowing over the right temporal region and breach artifact in this region, no epileptiform discharges. He weaned off oxcarbazepine in August and denies any seizure recurrence off medication in the past 5 months. He denies any episodes of aphasia, no staring/unreponsiveness, gaps in time, olfactory/gustatory hallucinations, focal numbness/tingling/weakness, myoclonic jerks. No headaches, dizziness, falls. He has a history of benign fasciculations, no change over the years. He retired and stays active playing golf, he plans to go hiking this weekend.   History on Initial Assessment 09/17/2017: This is a pleasant 65 year old man with a history of hyperlipidemia, atrial flutter s/p ablation, presenting for new onset seizures that occurred in the setting of a subdural hematoma. He had fallen last May 2019 then 3 weeks later on 09/04/17, his friend came to his house and no one was answering the door. They broke through the door and found him under his desk face down convulsing until EMS arrived. There was note on admission that he had several other witnessed seizures and received several doses of Versed. He was brought the [facility] where head CT showed an acute mixed density subdural hematoma overlying the right cerebral convexity up to 10mm, with additional small acute subdural hemorrhage overlying the left frontal convexity  up to 46mm. There was extension along the falx and left tentorium with blood seen at the foramen magnum, mass effect with 64mm right to left shift. He underwent emergent right craniotomy for evacuation. He initially had left-sided weakness with significant improvement on hospital discharge on 09/10/17. He was back in the ER the next day after an episode of sudden inability to get his words out. This lasted 4-5 minutes. He was aware throughout and could think of the word but could not say it. No focal symptoms, no associated headache. Repeat head CT showed slightly decreased right subdural hematoma size, post-surgical changes, stable left frontal subdural hematoma, no midline shift. Keppra dose was increased to 1000mg  BID. Since then, he has had several more similar episode of transient aphasia, he had one on Friday, 2 on Saturday, then one last Sunday. He would be visibly upset because he cannot articulate his words. He has been more tired on the higher dose of Keppra, and his significant other is concerned about an itchy rash on his back and abdomen.   He and his significant other deny any staring/unresponsive episodes, no gaps in time, olfactory/gustatory hallucinations, deja vu, rising epigastric sensation, focal numbness/tingling/weakness, myoclonic jerks.[name]   Epilepsy Risk Factors:  Bilateral subdural hematoma s/p fall R>L, s/p right craniotomy. Otherwise he had a normal birth and early development.  There is no history of febrile convulsions, CNS infections such as meningitis/encephalitis.  Diagnostic Data: 1-hour EEG in 03/2018 showed occasional right temporal slowing, breach artifact over the right centrotemporoparietal region   PAST MEDICAL HISTORY: Past Medical History:  Diagnosis Date  . AAA (abdominal aortic aneurysm) ([place]) 2020   3.2 cm in 2020   .  Atrial flutter ([place])   . Dyslipidemia   . OSA on CPAP 01/20/2014  . Paroxysmal atrial fibrillation [facility])     MEDICATIONS: Current  Outpatient Medications on File Prior to Visit  Medication Sig Dispense Refill  . acyclovir (ZOVIRAX) 800 MG tablet TAKE 1 TABLET (800 MG TOTAL) BY MOUTH 3 (THREE) TIMES DAILY AS NEEDED. 30 tablet 3  . atorvastatin (LIPITOR) 40 MG tablet Take 1 tablet (40 mg total) by mouth daily. 90 tablet 3  . CALCIUM-MAGNESIUM-ZINC PO Take 1 capsule by mouth daily.    . cholecalciferol (VITAMIN D) 1000 units tablet Take 1,000 Units by mouth daily.    . Coenzyme Q10 (CO Q 10 PO) Take 1 capsule by mouth daily.    . diazepam (VALIUM) 5 MG tablet Take 1 tablet (5 mg total) by mouth at bedtime as needed for anxiety. 30 tablet 1  . Multiple Vitamin (MULTIVITAMIN) capsule Take 1 capsule by mouth daily.    . Probiotic Product (PROBIOTIC-10) CAPS Take by mouth.    . sildenafil (VIAGRA) 100 MG tablet Take 100 mg by mouth daily as needed for erectile dysfunction.  24 tablet 3   No current facility-administered medications on file prior to visit.    ALLERGIES: Allergies  Allergen Reactions  . Ativan [Lorazepam]     irritable and agitated    FAMILY HISTORY: Family History  Problem Relation Age of Onset  . Diabetes Father   . Stroke Father   . Hypertension Mother        ?  [name] Heart attack Maternal Grandfather   . Colon cancer Neg Hx     SOCIAL HISTORY: Social History   Socioeconomic History  . Marital status: Divorced    Spouse name: Not on file  . Number of children: Not on file  . Years of education: Not on file  . Highest education level: Not on file  Occupational History  . Not on file  Tobacco Use  . Smoking status: Former Smoker    Quit date: 10/16/1998    Years since quitting: 21.4  . Smokeless tobacco: Never Used  Vaping Use  . Vaping Use: Never used  Substance and Sexual Activity  . Alcohol use: Yes    Alcohol/week: 14.0 - 21.0 standard drinks    Types: 14 - 21 Glasses of wine per week    Comment: red wine  . Drug use: No  . Sexual activity: Yes    Partners: Female  Other Topics  Concern  . Not on file  Social History Narrative   Has a Licensed conveyancer company since 1978      Daughter who is 46 and a Son who is 19   - Daughter in [place], Son is in [place].       He likes to hike, scuba dive, and travel      Lives in single story home    [name] - significant other   4 year degree   Right handed    Social Determinants of Health   Financial Resource Strain: Not on file  Food Insecurity: Not on file  Transportation Needs: Not on file  Physical Activity: Not on file  Stress: Not on file  Social Connections: Not on file  Intimate Partner Violence: Not on file     PHYSICAL EXAM: Vitals:   04/09/20 1553  BP: (!) 144/77  Pulse: 70  SpO2: 98%   General: No acute distress Head:  Normocephalic/atraumatic Skin/Extremities: No rash, no edema Neurological Exam: alert and  awake. No aphasia or dysarthria. Fund of knowledge is appropriate.  Recent and remote memory are intact.  Attention and concentration are normal.   Cranial nerves: Pupils equal, round. Extraocular movements intact with no nystagmus. Visual fields full.  No facial asymmetry.  Motor: Bulk and tone normal, muscle strength 5/5 throughout with no pronator drift.   Finger to nose testing intact.  Gait narrow-based and steady, able to tandem walk adequately.  Romberg negative.  IMPRESSION: This is a pleasant 65 yo RH man with a history of history of hyperlipidemia, atrial flutter s/p ablation, who presented with a GTC on 09/04/17, found to have a subdural hematoma from fall 3 weeks prior. He underwent right craniotomy, repeat head imaging did not show any further residual subdural hematoma.He had episodes of transient aphasia that resolved with initiation of oxcarbazepine, none since 09/2017. He expressed interest in weaning off seizure medication, repeat EEG in 09/2019 showed right temporal slowing, no epileptiform discharges. He has been off oxcarbazepine with no seizure recurrence in the past 5 months. We  again discussed avoidance of seizure triggers. He is aware of [place] driving laws to stop driving after a seizure until 6 months seizure-free. Follow-up as needed, he knows to call for any changes.    Thank you for allowing me to participate in his care.  Please do not hesitate to call for any questions or concerns.   Ellouise Newer, M.D.   CC: Dorothyann Peng, NP

## 2020-05-10 ENCOUNTER — Telehealth: Payer: Self-pay

## 2020-05-10 ENCOUNTER — Ambulatory Visit
Admission: RE | Admit: 2020-05-10 | Discharge: 2020-05-10 | Disposition: A | Discharge status: Home / Self Care | Payer: Medicare Other | Source: Ambulatory Visit | Attending: Neurology | Admitting: Neurology

## 2020-05-10 ENCOUNTER — Telehealth: Payer: Self-pay | Admitting: Neurology

## 2020-05-10 ENCOUNTER — Other Ambulatory Visit: Payer: Self-pay | Admitting: Neurology

## 2020-05-10 DIAGNOSIS — W19XXXA Unspecified fall, initial encounter: Secondary | ICD-10-CM

## 2020-05-10 DIAGNOSIS — S0990XA Unspecified injury of head, initial encounter: Secondary | ICD-10-CM | POA: Diagnosis not present

## 2020-05-10 DIAGNOSIS — Z9181 History of falling: Secondary | ICD-10-CM

## 2020-05-10 NOTE — Telephone Encounter (Signed)
Patient called in and said he fell off a ladder about 12 feet high and hit his head on the ground on Thursday. He is not sure if he knocked himself out or not. He did not want to go to the emergency room. He said his family wanted him to call Dr. Delice Lesch to let her know and get any advice.

## 2020-05-10 NOTE — Telephone Encounter (Signed)
With his history of brain bleed, would do a head CT without contrast. Pls order. Also pls ask if he had any warning, was he aware before falling/aware of the fall, or is it possibly a seizure? Thanks

## 2020-05-10 NOTE — Telephone Encounter (Signed)
Spoke with Steven Byrd informed her that with his history of brain bleed, would do a head CT without contrast. Also pls ask if he had any warning, was he aware before falling/aware of the fall, or is it possibly a seizure? No seizure ladder placement was cause of the fall.

## 2020-05-10 NOTE — Telephone Encounter (Signed)
Pt called informed that Dr Delice Lesch wants him to have a CT scan, he needs to be at [facility] by 3pm. Pt stated that he would try to make it

## 2020-05-11 ENCOUNTER — Telehealth: Payer: Self-pay

## 2020-05-11 NOTE — Telephone Encounter (Signed)
-----   Message from Cameron Sprang, MD sent at 05/11/2020  8:59 AM EST ----- Pls let patient know the head CT looked fine, no evidence of bleed or fracture. Thanks

## 2020-05-11 NOTE — Telephone Encounter (Signed)
Pt called to go over CT results no answer left a voice mail to call office back

## 2020-05-11 NOTE — Telephone Encounter (Signed)
-----   Message from Steven Sprang, MD sent at 05/11/2020  8:59 AM EST ----- Pls let patient know the head CT looked fine, no evidence of bleed or fracture. Thanks

## 2020-05-11 NOTE — Telephone Encounter (Signed)
Pt called and informed that head CT looked fine, no evidence of bleed or fracture

## 2020-05-18 DIAGNOSIS — H43812 Vitreous degeneration, left eye: Secondary | ICD-10-CM | POA: Diagnosis not present

## 2020-05-18 DIAGNOSIS — H43393 Other vitreous opacities, bilateral: Secondary | ICD-10-CM | POA: Diagnosis not present

## 2020-05-18 DIAGNOSIS — H401131 Primary open-angle glaucoma, bilateral, mild stage: Secondary | ICD-10-CM | POA: Diagnosis not present

## 2020-05-28 ENCOUNTER — Ambulatory Visit: Payer: Medicare Other

## 2020-06-04 DIAGNOSIS — Z23 Encounter for immunization: Secondary | ICD-10-CM | POA: Diagnosis not present

## 2020-07-05 ENCOUNTER — Telehealth: Payer: Self-pay | Admitting: Adult Health

## 2020-07-05 NOTE — Telephone Encounter (Signed)
The patient has done something to his back again and would like to have [facility] to send him in a new Rx for:  cyclobenzaprine (FLEXERIL) 10 MG tablet   Ammie Ferrier [address] [place] - [address] Phone:  [phone]  Fax:  [phone]     He also wanted to let Tommi Rumps know that he had some blood work done and has sent the results to him.

## 2020-07-06 ENCOUNTER — Other Ambulatory Visit: Payer: Self-pay | Admitting: Adult Health

## 2020-07-06 MED ORDER — CYCLOBENZAPRINE HCL 10 MG PO TABS: 10.0000 mg | ORAL_TABLET | Freq: Three times a day (TID) | ORAL | 0 refills | Status: DC | PRN

## 2020-07-15 ENCOUNTER — Telehealth: Payer: Self-pay | Admitting: Adult Health

## 2020-07-15 NOTE — Telephone Encounter (Signed)
Left message for patient to call back and schedule Medicare Annual Wellness Visit (AWV) either virtually or in office.    AWV-I per [place] 07/05/19 please schedule at anytime with [facility] Nurse Health Advisor 1 or 2   This should be a 45 minute visit.  

## 2020-07-21 ENCOUNTER — Ambulatory Visit (INDEPENDENT_AMBULATORY_CARE_PROVIDER_SITE_OTHER): Payer: Medicare Other

## 2020-07-21 ENCOUNTER — Other Ambulatory Visit: Payer: Self-pay

## 2020-07-21 DIAGNOSIS — Z0000 Encounter for general adult medical examination without abnormal findings: Secondary | ICD-10-CM

## 2020-07-21 NOTE — Progress Notes (Addendum)
Virtual Visit via Telephone Note  I connected with  [name] on 07/21/20 at 11:00 AM EDT by telephone and verified that I am speaking with the correct person using two identifiers.  Medicare Annual Wellness visit completed telephonically due to Covid-19 pandemic.   Persons participating in this call: This Health Coach and this patient.   Location: Patient: Home Provider: Offfice   I discussed the limitations, risks, security and privacy concerns of performing an evaluation and management service by telephone and the availability of in person appointments. The patient expressed understanding and agreed to proceed.  Unable to perform video visit due to video visit attempted and failed and/or patient does not have video capability.   Some vital signs may be absent or patient reported.   Steven Brace, LPN    Subjective:   Steven Byrd is a 65 y.o. male who presents for an Initial Medicare Annual Wellness Visit.  Review of Systems     Cardiac Risk Factors include: advanced age (>39men, >64 women)     Objective:    There were no vitals filed for this visit. There is no height or weight on file to calculate BMI.  Advanced Directives 07/21/2020 04/09/2020 09/23/2019 05/23/2019 03/22/2017 12/26/2013 12/03/2013  Does Patient Have a Medical Advance Directive? Yes Yes Yes Yes Yes No No  Type of Academic librarian Living will [place];Living will;Out of facility DNR (pink MOST or yellow form) [place];Living will [place];Living will - [place];Living will  Does patient want to make changes to medical advance directive? - - - - No - Patient declined - -  Copy of [place] in Chart? No - copy requested - - - Yes - -  Would patient like information on creating a medical advance directive? - - - - - No - patient declined information -    Current  Medications (verified) Outpatient Encounter Medications as of 07/21/2020  Medication Sig  . acyclovir (ZOVIRAX) 800 MG tablet TAKE 1 TABLET (800 MG TOTAL) BY MOUTH 3 (THREE) TIMES DAILY AS NEEDED.  Steven Byrd atorvastatin (LIPITOR) 40 MG tablet Take 1 tablet (40 mg total) by mouth daily.  Steven Byrd CALCIUM-MAGNESIUM-ZINC PO Take 1 capsule by mouth daily.  . cholecalciferol (VITAMIN D) 1000 units tablet Take 1,000 Units by mouth daily.  . Coenzyme Q10 (CO Q 10 PO) Take 1 capsule by mouth daily.  . cyclobenzaprine (FLEXERIL) 10 MG tablet Take 1 tablet (10 mg total) by mouth 3 (three) times daily as needed for muscle spasms.  . Multiple Vitamin (MULTIVITAMIN) capsule Take 1 capsule by mouth daily.   No facility-administered encounter medications on file as of 07/21/2020.    Allergies (verified) Ativan [lorazepam]   History: Past Medical History:  Diagnosis Date  . AAA (abdominal aortic aneurysm) ([place]) 2020   3.2 cm in 2020   . Atrial flutter ([place])   . Dyslipidemia   . OSA on CPAP 01/20/2014  . Paroxysmal atrial fibrillation [facility])    Past Surgical History:  Procedure Laterality Date  . A-FLUTTER ABLATION N/A 03/22/2017   Procedure: A-FLUTTER ABLATION;  Surgeon: Thompson Grayer, MD;  Location: [place] [facility];  Service: Cardiovascular;  Laterality: N/A;  . CRANIOTOMY Right 09/04/2017   Procedure: CRANIOTOMY FOR SUBDURAL HEMATOMA;  Surgeon: Erline Levine, MD;  Location: [place];  Service: Neurosurgery;  Laterality: Right;   Family History  Problem Relation Age of Onset  . Diabetes  Father   . Stroke Father   . Hypertension Mother        ?  Steven Byrd Heart attack Maternal Grandfather   . Colon cancer Neg Hx    Social History   Socioeconomic History  . Marital status: Divorced    Spouse name: Not on file  . Number of children: Not on file  . Years of education: Not on file  . Highest education level: Not on file  Occupational History  . Not on file  Tobacco Use  . Smoking status: Former Smoker    Quit  date: 10/16/1998    Years since quitting: 21.7  . Smokeless tobacco: Never Used  Vaping Use  . Vaping Use: Never used  Substance and Sexual Activity  . Alcohol use: Yes    Alcohol/week: 14.0 - 21.0 standard drinks    Types: 14 - 21 Glasses of wine per week    Comment: red wine  . Drug use: No  . Sexual activity: Yes    Partners: Female  Other Topics Concern  . Not on file  Social History Narrative   Has a Licensed conveyancer company since 1978      Daughter who is 35 and a Son who is 52   - Daughter in [place], Son is in [place].       He likes to hike, scuba dive, and travel      Lives in single story home    Steven Byrd - significant other   4 year degree   Right handed    Social Determinants of Health   Financial Resource Strain: Not on file  Food Insecurity: Not on file  Transportation Needs: Not on file  Physical Activity: Sufficiently Active  . Days of Exercise per Week: 5 days  . Minutes of Exercise per Session: 120 min  Stress: No Stress Concern Present  . Feeling of Stress : Not at all  Social Connections: Moderately Integrated  . Frequency of Communication with Friends and Family: More than three times a week  . Frequency of Social Gatherings with Friends and Family: More than three times a week  . Attends Religious Services: Never  . Active Member of Clubs or Organizations: Yes  . Attends Archivist Meetings: 1 to 4 times per year  . Marital Status: Living with partner    Tobacco Counseling Counseling given: Not Answered   Clinical Intake:  Pre-visit preparation completed: Yes  Pain : No/denies pain     BMI - recorded: 25.42 Nutritional Status: BMI 25 -29 Overweight Nutritional Risks: None Diabetes: No  How often do you need to have someone help you when you read instructions, pamphlets, or other written materials from your doctor or pharmacy?: 1 - Never  Diabetic?No  Interpreter Needed?: No  Information entered by :: Charlott Rakes,  LPN   Activities of Daily Living In your present state of health, do you have any difficulty performing the following activities: 07/21/2020  Hearing? N  Vision? N  Difficulty concentrating or making decisions? Y  Comment memory at times  Walking or climbing stairs? N  Dressing or bathing? N  Doing errands, shopping? N  Preparing Food and eating ? N  Using the Toilet? N  Managing your Medications? N  Managing your Finances? N  Housekeeping or managing your Housekeeping? N  Some recent data might be hidden    Patient Care Team: Dorothyann Peng, NP as PCP - General (Family Medicine) Cameron Sprang, MD as Consulting Physician (Neurology)  Indicate any recent Medical Services you may have received from other than [facility] providers in the past year (date may be approximate).     Assessment:   This is a routine wellness examination for [place].  Hearing/Vision screen  Hearing Screening   125Hz  250Hz  500Hz  1000Hz  2000Hz  3000Hz  4000Hz  6000Hz  8000Hz   Right ear:           Left ear:           Comments: Pt denies any hearing issues  Vision Screening Comments: Pt follows up with Dr Kathlen Mody semi annually for eye exams   Dietary issues and exercise activities discussed: Current Exercise Habits: Home exercise routine, Type of exercise: walking (golf), Time (Minutes): > 60, Frequency (Times/Week): 5, Weekly Exercise (Minutes/Week): 0  Goals Addressed            This Visit's Progress   . Patient Stated       None at this time      Depression Screen PHQ 2/9 Scores 07/21/2020 07/26/2017  PHQ - 2 Score 0 0    Fall Risk Fall Risk  07/21/2020 04/09/2020 09/23/2019 05/23/2019 10/23/2018  Falls in the past year? 1 0 0 0 0  Number falls in past yr: 1 0 0 0 0  Comment - - - - -  Injury with Fall? 1 0 0 0 0  Comment fell off ladder scrapes - - - -  Risk for fall due to : Impaired vision - - - -  Follow up Falls prevention discussed - - - Falls evaluation completed    FALL RISK PREVENTION  PERTAINING TO THE HOME:  Any stairs in or around the home? Yes  If so, are there any without handrails? No  Home free of loose throw rugs in walkways, pet beds, electrical cords, etc? Yes  Adequate lighting in your home to reduce risk of falls? Yes   ASSISTIVE DEVICES UTILIZED TO PREVENT FALLS:  Life alert? No  Use of a cane, walker or w/c? No  Grab bars in the bathroom? Yes  Shower chair or bench in shower? Yes  Elevated toilet seat or a handicapped toilet? No   TIMED UP AND GO:  Was the test performed? No     Cognitive Function:   Montreal Cognitive Assessment  10/23/2018  Visuospatial/ Executive (0/5) 5  Naming (0/3) 3  Attention: Read list of digits (0/2) 2  Attention: Read list of letters (0/1) 1  Attention: Serial 7 subtraction starting at 100 (0/3) 3  Language: Repeat phrase (0/2) 2  Language : Fluency (0/1) 1  Abstraction (0/2) 2  Delayed Recall (0/5) 4  Orientation (0/6) 6  Total 29   6CIT Screen 07/21/2020  What Year? 0 points  What month? 0 points  Count back from 20 0 points  Months in reverse 0 points  Repeat phrase 0 points    Immunizations Immunization History  Administered Date(s) Administered  . Fluad Quad(high Dose 65+) 11/06/2018, 11/25/2019  . Hepatitis A, Adult 11/27/2014  . Influenza Inj Mdck Quad Pf 01/01/2017  . Influenza Split 01/18/2012, 01/17/2013  . Influenza Whole 01/12/2009, 02/08/2010  . Influenza,inj,Quad PF,6+ Mos 11/10/2014, 12/29/2015, 12/16/2017  . Influenza-Unspecified 01/07/2014  . PFIZER Comirnaty(Gray Top)Covid-19 Tri-Sucrose Vaccine 04/12/2019, 05/03/2019, 12/02/2019, 06/04/2020  . Pneumococcal Conjugate-13 11/06/2018  . Td 03/07/1999, 02/08/2010  . Tdap 11/25/2019  . Typhoid Inactivated 11/27/2014  . Zoster Recombinat (Shingrix) 04/28/2018, 09/15/2018    TDAP status: Up to date  Flu Vaccine status: Up to date  Pneumococcal vaccine  status: Due, Education has been provided regarding the importance of this vaccine.  Advised may receive this vaccine at local pharmacy or Health Dept. Aware to provide a copy of the vaccination record if obtained from local pharmacy or Health Dept. Verbalized acceptance and understanding.  Covid-19 vaccine status: Completed vaccines  Qualifies for Shingles Vaccine? Yes   Zostavax completed Yes   Shingrix Completed?: Yes  Screening Tests Health Maintenance  Topic Date Due  . PNA vac Low Risk Adult (2 of 2 - PPSV23) 11/06/2019  . INFLUENZA VACCINE  10/04/2020  . COLONOSCOPY (Pts 45-49yrs Insurance coverage will need to be confirmed)  11/13/2023  . TETANUS/TDAP  11/24/2029  . COVID-19 Vaccine  Completed  . Hepatitis C Screening  Completed  . HPV VACCINES  Aged Out    Health Maintenance  Health Maintenance Due  Topic Date Due  . PNA vac Low Risk Adult (2 of 2 - PPSV23) 11/06/2019    Colorectal cancer screening: Type of screening: Colonoscopy. Completed 11/12/13. Repeat every 10 years  Additional Screening:  Hepatitis C Screening:  Completed 07/26/17  Vision Screening: Recommended annual ophthalmology exams for early detection of glaucoma and other disorders of the eye. Is the patient up to date with their annual eye exam?  Yes  Who is the provider or what is the name of the office in which the patient attends annual eye exams? Semi annual Dr Kathlen Mody  If pt is not established with a provider, would they like to be referred to a provider to establish care? No .   Dental Screening: Recommended annual dental exams for proper oral hygiene  Community Resource Referral / Chronic Care Management: CRR required this visit?  No   CCM required this visit?  No      Plan:     I have personally reviewed and noted the following in the patient's chart:   . Medical and social history . Use of alcohol, tobacco or illicit drugs  . Current medications and supplements including opioid prescriptions. Patient is not currently taking opioid prescriptions. . Functional ability  and status . Nutritional status . Physical activity . Advanced directives . List of other physicians . Hospitalizations, surgeries, and ER visits in previous 12 months . Vitals . Screenings to include cognitive, depression, and falls . Referrals and appointments  In addition, I have reviewed and discussed with patient certain preventive protocols, quality metrics, and best practice recommendations. A written personalized care plan for preventive services as well as general preventive health recommendations were provided to patient.     Steven Brace, LPN   [id number]   Nurse Notes: None

## 2020-07-21 NOTE — Patient Instructions (Addendum)
Steven Byrd , Thank you for taking time to come for your Medicare Wellness Visit. I appreciate your ongoing commitment to your health goals. Please review the following plan we discussed and let me know if I can assist you in the future.   Screening recommendations/referrals: Colonoscopy: Done 11/12/13 Recommended yearly ophthalmology/optometry visit for glaucoma screening and checkupn Recommended yearly dental visit for hygiene and checkup  Vaccinations: Influenza vaccine: Up to date Pneumococcal vaccine: Due and discussed Tdap vaccine: Up to date Shingles vaccine: Completed 2/23 & 09/15/18   Covid-19: Completed  2/6, 2/27, 12/02/19 & 06/04/20  Advanced directives: Please bring a copy of your health care power of attorney and living will to the office at your convenience.  Conditions/risks identified: None at this time  Next appointment: Follow up in one year for your annual wellness visit.   Preventive Care 31 Years and Older, Male Preventive care refers to lifestyle choices and visits with your health care provider that can promote health and wellness. What does preventive care include?  A yearly physical exam. This is also called an annual well check.  Dental exams once or twice a year.  Routine eye exams. Ask your health care provider how often you should have your eyes checked.  Personal lifestyle choices, including:  Daily care of your teeth and gums.  Regular physical activity.  Eating a healthy diet.  Avoiding tobacco and drug use.  Limiting alcohol use.  Practicing safe sex.  Taking low doses of aspirin every day.  Taking vitamin and mineral supplements as recommended by your health care provider. What happens during an annual well check? The services and screenings done by your health care provider during your annual well check will depend on your age, overall health, lifestyle risk factors, and family history of disease. Counseling  Your health care provider  may ask you questions about your:  Alcohol use.  Tobacco use.  Drug use.  Emotional well-being.  Home and relationship well-being.  Sexual activity.  Eating habits.  History of falls.  Memory and ability to understand (cognition).  Work and work Statistician. Screening  You may have the following tests or measurements:  Height, weight, and BMI.  Blood pressure.  Lipid and cholesterol levels. These may be checked every 5 years, or more frequently if you are over 13 years old.  Skin check.  Lung cancer screening. You may have this screening every year starting at age 60 if you have a 30-pack-year history of smoking and currently smoke or have quit within the past 15 years.  Fecal occult blood test (FOBT) of the stool. You may have this test every year starting at age 40.  Flexible sigmoidoscopy or colonoscopy. You may have a sigmoidoscopy every 5 years or a colonoscopy every 10 years starting at age 66.  Prostate cancer screening. Recommendations will vary depending on your family history and other risks.  Hepatitis C blood test.  Hepatitis B blood test.  Sexually transmitted disease (STD) testing.  Diabetes screening. This is done by checking your blood sugar (glucose) after you have not eaten for a while (fasting). You may have this done every 1-3 years.  Abdominal aortic aneurysm (AAA) screening. You may need this if you are a current or former smoker.  Osteoporosis. You may be screened starting at age 100 if you are at high risk. Talk with your health care provider about your test results, treatment options, and if necessary, the need for more tests. Vaccines  Your health care provider  may recommend certain vaccines, such as:  Influenza vaccine. This is recommended every year.  Tetanus, diphtheria, and acellular pertussis (Tdap, Td) vaccine. You may need a Td booster every 10 years.  Zoster vaccine. You may need this after age 72.  Pneumococcal 13-valent  conjugate (PCV13) vaccine. One dose is recommended after age 33.  Pneumococcal polysaccharide (PPSV23) vaccine. One dose is recommended after age 71. Talk to your health care provider about which screenings and vaccines you need and how often you need them. This information is not intended to replace advice given to you by your health care provider. Make sure you discuss any questions you have with your health care provider. Document Released: 03/19/2015 Document Revised: 11/10/2015 Document Reviewed: 12/22/2014 Elsevier Interactive Patient Education  2017 [place] Prevention in the Home Falls can cause injuries. They can happen to people of all ages. There are many things you can do to make your home safe and to help prevent falls. What can I do on the outside of my home?  Regularly fix the edges of walkways and driveways and fix any cracks.  Remove anything that might make you trip as you walk through a door, such as a raised step or threshold.  Trim any bushes or trees on the path to your home.  Use bright outdoor lighting.  Clear any walking paths of anything that might make someone trip, such as rocks or tools.  Regularly check to see if handrails are loose or broken. Make sure that both sides of any steps have handrails.  Any raised decks and porches should have guardrails on the edges.  Have any leaves, snow, or ice cleared regularly.  Use sand or salt on walking paths during winter.  Clean up any spills in your garage right away. This includes oil or grease spills. What can I do in the bathroom?  Use night lights.  Install grab bars by the toilet and in the tub and shower. Do not use towel bars as grab bars.  Use non-skid mats or decals in the tub or shower.  If you need to sit down in the shower, use a plastic, non-slip stool.  Keep the floor dry. Clean up any water that spills on the floor as soon as it happens.  Remove soap buildup in the tub or  shower regularly.  Attach bath mats securely with double-sided non-slip rug tape.  Do not have throw rugs and other things on the floor that can make you trip. What can I do in the bedroom?  Use night lights.  Make sure that you have a light by your bed that is easy to reach.  Do not use any sheets or blankets that are too big for your bed. They should not hang down onto the floor.  Have a firm chair that has side arms. You can use this for support while you get dressed.  Do not have throw rugs and other things on the floor that can make you trip. What can I do in the kitchen?  Clean up any spills right away.  Avoid walking on wet floors.  Keep items that you use a lot in easy-to-reach places.  If you need to reach something above you, use a strong step stool that has a grab bar.  Keep electrical cords out of the way.  Do not use floor polish or wax that makes floors slippery. If you must use wax, use non-skid floor wax.  Do not have throw rugs  and other things on the floor that can make you trip. What can I do with my stairs?  Do not leave any items on the stairs.  Make sure that there are handrails on both sides of the stairs and use them. Fix handrails that are broken or loose. Make sure that handrails are as long as the stairways.  Check any carpeting to make sure that it is firmly attached to the stairs. Fix any carpet that is loose or worn.  Avoid having throw rugs at the top or bottom of the stairs. If you do have throw rugs, attach them to the floor with carpet tape.  Make sure that you have a light switch at the top of the stairs and the bottom of the stairs. If you do not have them, ask someone to add them for you. What else can I do to help prevent falls?  Wear shoes that:  Do not have high heels.  Have rubber bottoms.  Are comfortable and fit you well.  Are closed at the toe. Do not wear sandals.  If you use a stepladder:  Make sure that it is fully  opened. Do not climb a closed stepladder.  Make sure that both sides of the stepladder are locked into place.  Ask someone to hold it for you, if possible.  Clearly mark and make sure that you can see:  Any grab bars or handrails.  First and last steps.  Where the edge of each step is.  Use tools that help you move around (mobility aids) if they are needed. These include:  Canes.  Walkers.  Scooters.  Crutches.  Turn on the lights when you go into a dark area. Replace any light bulbs as soon as they burn out.  Set up your furniture so you have a clear path. Avoid moving your furniture around.  If any of your floors are uneven, fix them.  If there are any pets around you, be aware of where they are.  Review your medicines with your doctor. Some medicines can make you feel dizzy. This can increase your chance of falling. Ask your doctor what other things that you can do to help prevent falls. This information is not intended to replace advice given to you by your health care provider. Make sure you discuss any questions you have with your health care provider. Document Released: 12/17/2008 Document Revised: 07/29/2015 Document Reviewed: 03/27/2014 Elsevier Interactive Patient Education  2017 [employer].

## 2020-07-22 ENCOUNTER — Other Ambulatory Visit: Payer: Self-pay | Admitting: Adult Health

## 2020-07-27 NOTE — Telephone Encounter (Signed)
Okay for refill?  

## 2020-10-14 ENCOUNTER — Other Ambulatory Visit: Payer: Self-pay

## 2020-10-14 ENCOUNTER — Emergency Department (HOSPITAL_COMMUNITY)
Admission: EM | Admit: 2020-10-14 | Discharge: 2020-10-14 | Disposition: A | Discharge status: Home / Self Care | Payer: Medicare Other | Attending: Emergency Medicine | Admitting: Emergency Medicine

## 2020-10-14 ENCOUNTER — Telehealth: Payer: Self-pay | Admitting: Internal Medicine

## 2020-10-14 ENCOUNTER — Encounter (HOSPITAL_COMMUNITY): Payer: Self-pay | Admitting: Emergency Medicine

## 2020-10-14 DIAGNOSIS — R002 Palpitations: Secondary | ICD-10-CM

## 2020-10-14 DIAGNOSIS — I480 Paroxysmal atrial fibrillation: Secondary | ICD-10-CM | POA: Diagnosis not present

## 2020-10-14 DIAGNOSIS — Z87891 Personal history of nicotine dependence: Secondary | ICD-10-CM | POA: Diagnosis not present

## 2020-10-14 LAB — CBC WITH DIFFERENTIAL/PLATELET
Abs Immature Granulocytes: 0.03 10*3/uL (ref 0.00–0.07)
Basophils Absolute: 0 10*3/uL (ref 0.0–0.1)
Basophils Relative: 1 %
Eosinophils Absolute: 0.1 10*3/uL (ref 0.0–0.5)
Eosinophils Relative: 1 %
HCT: 40.3 % (ref 39.0–52.0)
Hemoglobin: 13.5 g/dL (ref 13.0–17.0)
Immature Granulocytes: 0 %
Lymphocytes Relative: 28 %
Lymphs Abs: 2.3 10*3/uL (ref 0.7–4.0)
MCH: 32.8 pg (ref 26.0–34.0)
MCHC: 33.5 g/dL (ref 30.0–36.0)
MCV: 98.1 fL (ref 80.0–100.0)
Monocytes Absolute: 1 10*3/uL (ref 0.1–1.0)
Monocytes Relative: 12 %
Neutro Abs: 4.8 10*3/uL (ref 1.7–7.7)
Neutrophils Relative %: 58 %
Platelets: 260 10*3/uL (ref 150–400)
RBC: 4.11 MIL/uL — ABNORMAL LOW (ref 4.22–5.81)
RDW: 13.2 % (ref 11.5–15.5)
WBC: 8.3 10*3/uL (ref 4.0–10.5)
nRBC: 0 % (ref 0.0–0.2)

## 2020-10-14 LAB — BASIC METABOLIC PANEL
Anion gap: 10 (ref 5–15)
BUN: 25 mg/dL — ABNORMAL HIGH (ref 8–23)
CO2: 23 mmol/L (ref 22–32)
Calcium: 9.6 mg/dL (ref 8.9–10.3)
Chloride: 101 mmol/L (ref 98–111)
Creatinine, Ser: 1.04 mg/dL (ref 0.61–1.24)
GFR, Estimated: >60 mL/min (ref >60)
Glucose, Bld: 95 mg/dL (ref 70–99)
Potassium: 3.7 mmol/L (ref 3.5–5.1)
Sodium: 134 mmol/L — ABNORMAL LOW (ref 135–145)

## 2020-10-14 LAB — MAGNESIUM: Magnesium: 1.9 mg/dL (ref 1.7–2.4)

## 2020-10-14 NOTE — Telephone Encounter (Signed)
Thanks for letting me know.  Dr. Debara Pickett

## 2020-10-14 NOTE — Telephone Encounter (Signed)
Patient c/o Palpitations:  High priority if patient c/o lightheadedness, shortness of breath, or chest pain  How long have you had palpitations/irregular HR/ Afib? Are you having the symptoms now? Started this morning 9am, yes  Are you currently experiencing lightheadedness, SOB or CP? no  Do you have a history of afib (atrial fibrillation) or irregular heart rhythm? yes  Have you checked your BP or HR? (document readings if available): HR 160 now, was 164 earlier  Are you experiencing any other symptoms? No    Patient's friend [name] states the patient's afib started this morning and the patient does not believe he has been in normal rhythm all day.

## 2020-10-14 NOTE — ED Provider Notes (Signed)
[facility] EMERGENCY DEPARTMENT Provider Note   CSN: [id number] Arrival date & time: 10/14/20  1757     History Chief Complaint  Patient presents with   Steven Byrd is a 65 y.o. male.   Steven Fibrillation Pertinent negatives include no chest pain, no abdominal pain and no shortness of breath.   65 year old male with a past medical history of paroxysmal Steven fibrillation status post ablation not on anticoagulation presenting to the emergency department for palpitations.  Patient reports that when he woke up this morning, he was having some palpitations.  He applied his apple watch in the device told him that his heart rate was around 160.  However, he did not have any chest pain or shortness of breath so he went out to play golf.  When he got back into his house, his significant other had called his cardiology office and they recommended that he go to the emergency department.  Patient reports that around when he checked into the emergency department, his heart rate normalized and he felt asymptomatic.  He currently denies any palpitations, chest pain, shortness of breath.  He denies any orthopnea or leg swelling.  He states that he recently got back from a cruise where he had above average consumption of alcohol and coffee, but he returned from this several days ago.  He denies any recent medication changes.  Past Medical History:  Diagnosis Date   AAA (abdominal aortic aneurysm) ([place]) 2020   3.2 cm in 2020    Steven flutter ([place])    Dyslipidemia    OSA on CPAP 01/20/2014   Paroxysmal Steven fibrillation [facility])     Patient Active Problem List   Diagnosis Date Noted   Subacute subdural hematoma ([place]) 09/04/2017   Seizure ([place]) 09/04/2017   Left hemiparesis ([place]) 09/04/2017   OSA on CPAP 01/20/2014   Snoring 10/15/2013   Witnessed apneic spells 10/15/2013   Typical Steven flutter ([place]) 09/29/2013   Erectile dysfunction  01/18/2012   Benign fasciculations 01/18/2012   Dyslipidemia 01/07/2007   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   Heart attack Maternal Grandfather    Colon cancer Neg Hx     Social History   Tobacco Use   Smoking status: Former    Types: Cigarettes    Quit date: 10/16/1998    Years since quitting: 22.0   Smokeless tobacco: Never  Vaping Use   Vaping Use: Never used  Substance Use Topics   Alcohol use: Yes    Alcohol/week: 14.0 - 21.0 standard drinks    Types: 14 - 21 Glasses of wine per week    Comment: red wine   Drug use: No    Home Medications Prior to Admission medications   Medication Sig Start Date End Date Taking? Authorizing Provider  acyclovir (ZOVIRAX) 800 MG tablet TAKE 1 TABLET (800 MG TOTAL)  BY MOUTH 3 (THREE) TIMES DAILY AS NEEDED. 11/15/18   Nafziger, Tommi Rumps, NP  atorvastatin (LIPITOR) 40 MG tablet Take 1 tablet (40 mg total) by mouth daily. 01/06/20   Nafziger, Tommi Rumps, NP  CALCIUM-MAGNESIUM-ZINC PO Take 1 capsule by mouth daily.    [provider]  cholecalciferol (VITAMIN D) 1000 units tablet Take 1,000 Units by mouth daily.    [provider]  Coenzyme Q10 (CO Q 10 PO) Take 1 capsule by mouth daily.    [provider]  cyclobenzaprine (FLEXERIL) 10 MG tablet TAKE ONE TABLET BY MOUTH THREE TIMES A DAY AS NEEDED FOR MUSCLE SPASMS 07/27/20   Nafziger, Tommi Rumps, NP  Multiple Vitamin (MULTIVITAMIN) capsule Take 1  capsule by mouth daily.    [provider]    Allergies    Ativan [lorazepam]  Review of Systems   Review of Systems  Constitutional:  Negative for chills and fever.  HENT:  Negative for ear pain and sore throat.   Eyes:  Negative for pain and visual disturbance.  Respiratory:  Negative for cough and shortness of breath.   Cardiovascular:  Positive for palpitations. Negative for chest pain and leg swelling.  Gastrointestinal:  Negative for abdominal pain and vomiting.  Genitourinary:  Negative for dysuria and hematuria.  Musculoskeletal:  Negative for arthralgias and back pain.  Skin:  Negative for color change and rash.  Neurological:  Negative for seizures and syncope.  All other systems reviewed and are negative.  Physical Exam Updated Vital Signs BP (!) 117/98   Pulse 73   Temp 98.2 F (36.8 C)   Resp 13   SpO2 98%   Physical Exam Vitals and nursing note reviewed.  Constitutional:      General: He is not in acute distress.    Appearance: Normal appearance. He is well-developed and normal weight. He is not ill-appearing or toxic-appearing.  HENT:     Head: Normocephalic and atraumatic.  Eyes:     Conjunctiva/sclera: Conjunctivae normal.  Cardiovascular:     Rate and Rhythm: Normal rate and regular rhythm.     Heart sounds: No murmur heard. Pulmonary:     Effort: Pulmonary effort is normal. No respiratory distress.     Breath sounds: Normal breath sounds.  Abdominal:     Palpations: Abdomen is soft.     Tenderness: There is no abdominal tenderness. There is no guarding or rebound.  Musculoskeletal:     Cervical back: Neck supple.     Right lower leg: No edema.     Left lower leg: No edema.  Skin:    General: Skin is warm and dry.     Capillary Refill: Capillary refill takes less than 2 seconds.  Neurological:     Mental Status: He is alert and oriented to person, place, and time.    ED Results / Procedures / Treatments   Labs (all labs ordered  are listed, but only abnormal results are displayed) Labs Reviewed  BASIC METABOLIC PANEL - Abnormal; Notable for the following components:      Result Value   Sodium 134 (*)    BUN 25 (*)    All other components within normal limits  CBC WITH DIFFERENTIAL/PLATELET - Abnormal; Notable for the following components:   RBC 4.11 (*)    All other components within normal limits  MAGNESIUM    EKG None  Radiology No results found.  Procedures Procedures   Medications Ordered in ED Medications - No data to display  ED  Course  I have reviewed the triage vital signs and the nursing notes.  Pertinent labs & imaging results that were available during my care of the patient were reviewed by me and considered in my medical decision making (see chart for details).    MDM Rules/Calculators/A&P                           65 year old male with a past medical history of Steven fibrillation status post ablation presenting to the emergency department with palpitations.  Vital signs reviewed, within acceptable limits.  Currently in the ED, the patient's heart rate is 73.  He has a regular rate and rhythm on my exam.  EKG obtained, demonstrates a normal sinus rhythm with an appropriate rate, appropriate intervals.  No anatomic ST elevation or ST depression to be concerning for acute ischemia.  CBC within acceptable limits.  He denies any recent infectious symptoms.  He has no significant electrolyte derangement that would explain his arrhythmia. No edema or JVD. It seems as though the patient possibly had a brief recurrence of his paroxysmal Steven fibrillation, possibly with a rate in the 160s as read by his apple watch.  However, he is currently asymptomatic and normal sinus rhythm and he has been monitored in the emergency department for several hours and he remained stable.  Given this, I believe that he is stable for discharge in the emergency department for outpatient follow-up with his  cardiologist.  I do not believe that anticoagulation or additional rate control agents are indicated at this time.  Patient is comfortable with the plan, strict return precautions discussed.  Final Clinical Impression(s) / ED Diagnoses Final diagnoses:  Palpitations    Rx / DC Orders ED Discharge Orders          Ordered    Amb referral to AFIB Clinic        10/14/20 1811             Claud Kelp, MD 10/14/20 2015    Noemi Chapel, MD 10/16/20 1230

## 2020-10-14 NOTE — ED Provider Notes (Signed)
Emergency Medicine Provider Triage Evaluation Note  Steven Byrd , a 65 y.o. male  was evaluated in triage.  Pt complains of palpitations and tachycardia.  He reports that his symptoms began earlier today.  Feels like his symptoms have improved.  He has a history of atrial fibrillation status post ablation 4 years prior.  She is not on any blood thinners.  Review of Systems  Positive: Palpitations, tachycardia Negative: Chest pain, shortness of breath, unilateral leg swelling or tenderness, fevers, chills  Physical Exam  BP 117/82 (BP Location: Left Arm)   Pulse 81   Temp 98.2 F (36.8 C)   Resp 12   SpO2 97%  Gen:   Awake, no distress   Resp:  Normal effort, lungs clear to auscultation bilaterally MSK:   Moves extremities without difficulty  Other:  S1, S2 present with no murmurs rubs or gallops, regular rhythm. +2 radial pulse bilaterally.  Medical Decision Making  Medically screening exam initiated at 6:11 PM.  Appropriate orders placed.  Steven Byrd was informed that the remainder of the evaluation will be completed by another provider, this initial triage assessment does not replace that evaluation, and the importance of remaining in the ED until their evaluation is complete.  The patient appears stable so that the remainder of the work up may be completed by another provider.      Steven Beckwith, PA-C 10/14/20 1812    Steven Freeze, MD 10/14/20 [phone]

## 2020-10-14 NOTE — ED Provider Notes (Signed)
I saw and evaluated the patient, reviewed the resident's note and I agree with the findings and plan.  Pertinent History: palpitations after golf today - HR was elevated at home =- hx of paroxysmal afib - s/p ablation - not anticoagulated - he feels better now -   Pertinent Exam findings: in NSR - feeling well - looking well - no murmum, no edema, no JVD, clear lungs.     EKG Interpretation  Date/Time:  Thursday October 14 2020 18:03:39 EDT Ventricular Rate:  83 PR Interval:  142 QRS Duration: 74 QT Interval:  340 QTC Calculation: 399 R Axis:   61 Text Interpretation: Normal sinus rhythm Normal ECG Confirmed by Noemi Chapel [phone]) on 10/14/2020 8:14:26 PM       I was personally present and directly supervised the following procedures:  Medical evaluation Well appearing for d/c  I personally interpreted the EKG as well as the resident and agree with the interpretation on the resident's chart.  Final diagnoses:  Palpitations      Noemi Chapel, MD 10/16/20 1230

## 2020-10-14 NOTE — ED Triage Notes (Signed)
Pt states he is in afib, hx of the same, states he had a ablation x 4 years ago. Denies chest pain/shortness of breath, states he has palpitations.

## 2020-10-14 NOTE — Telephone Encounter (Signed)
Received stat call into triage from patient and patients friend [name] (okay per [facility]) who states that patient went into A-fib this morning with rates in the 130's to 170's according to his apple watch. Patients last OV with Dr. Debara Pickett was 01/09/2018. Patient has a history of Aflutter/Afib and had a previous ablation in 2019 with no recurrent episodes until now. Patient states he went to play golf today but states that he was unable to walk the golf course and reports that he had to ride due to being tired/worn out. Patient reports that his BP is 95/66 and recheck was 122/69 though they were having trouble with BP cuff. Per patient he recently went on an [place] and states that he did not wear his Cpap but overall no other changes. Patient states he only takes Atorvastatin daily. Advised patient that he should report to the ER for evaluation and EKG, patient and patients friend is reluctant to this and requested I speak with DOD. Spoke with Dr. Ellyn Hack (DOD) who states patient should report to the ER for evaluation. Advised patient and patients friend of this recommendation and they both verbalized understanding. Patient will go now. Will forward to Dr. Debara Pickett to make aware.

## 2020-10-15 NOTE — Telephone Encounter (Signed)
Patient scheduled for 10/27/20 appointment with Butch Penny NP (AFib Clinic)

## 2020-10-27 ENCOUNTER — Ambulatory Visit (HOSPITAL_COMMUNITY)
Admission: RE | Admit: 2020-10-27 | Discharge: 2020-10-27 | Disposition: A | Discharge status: Home / Self Care | Payer: Medicare Other | Source: Ambulatory Visit | Attending: Nurse Practitioner | Admitting: Nurse Practitioner

## 2020-10-27 ENCOUNTER — Encounter (HOSPITAL_COMMUNITY): Payer: Self-pay | Admitting: Nurse Practitioner

## 2020-10-27 ENCOUNTER — Other Ambulatory Visit: Payer: Self-pay

## 2020-10-27 VITALS — BP 110/66 | HR 67 | Ht 73.0 in | Wt 181.0 lb

## 2020-10-27 DIAGNOSIS — I4892 Unspecified atrial flutter: Secondary | ICD-10-CM | POA: Diagnosis not present

## 2020-10-27 DIAGNOSIS — Z8249 Family history of ischemic heart disease and other diseases of the circulatory system: Secondary | ICD-10-CM | POA: Insufficient documentation

## 2020-10-27 DIAGNOSIS — Z87891 Personal history of nicotine dependence: Secondary | ICD-10-CM | POA: Diagnosis not present

## 2020-10-27 DIAGNOSIS — I471 Supraventricular tachycardia: Secondary | ICD-10-CM

## 2020-10-27 DIAGNOSIS — E785 Hyperlipidemia, unspecified: Secondary | ICD-10-CM | POA: Diagnosis not present

## 2020-10-27 DIAGNOSIS — I480 Paroxysmal atrial fibrillation: Secondary | ICD-10-CM | POA: Insufficient documentation

## 2020-10-27 MED ORDER — DILTIAZEM HCL 30 MG PO TABS: ORAL_TABLET | ORAL | 1 refills | Status: DC

## 2020-10-27 NOTE — Progress Notes (Signed)
Primary Care Physician: Dorothyann Peng, NP Referring Physician: [facility] f/u  EP: Dr. Gigi Gin Steven Byrd is a 65 y.o. male with a h/o typical atrial flutter ablation, 03/12/2017, that had a visit to the ER 8/11,  2/2 to noticing a HR of 160-170 bpm via his apple watch that am. Prior to playing golf, and after finishing playing 18 holes of golf and the HR still not back to normal, proceeded to the ER. He reports that he felt pretty comfortable playing golf with his elevated HR. On walking into the ER he went back into normal rhythm. He has not had any further heart rate issues. This is the first episode since aflutter ablation.   He states the trigger may have been not using his cpap machine, as he had just come off an [place] and got out of the habit using it, as well as alcohol the night before and extra caffeine. He had a CHA2DS2VASc  score of 1 and is not on anticoagulation. He feels the episode lasted 12 hours.   Today, he denies symptoms of palpitations, chest pain, shortness of breath, orthopnea, PND, lower extremity edema, dizziness, presyncope, syncope, or neurologic sequela. The patient is tolerating medications without difficulties and is otherwise without complaint today.   Past Medical History:  Diagnosis Date   AAA (abdominal aortic aneurysm) ([place]) 2020   3.2 cm in 2020    Atrial flutter (HCC)    Dyslipidemia    OSA on CPAP 01/20/2014   Paroxysmal atrial fibrillation [facility])    Past Surgical History:  Procedure Laterality Date   A-FLUTTER ABLATION N/A 03/22/2017   Procedure: A-FLUTTER ABLATION;  Surgeon: Thompson Grayer, MD;  Location: [place] [facility];  Service: Cardiovascular;  Laterality: N/A;   CRANIOTOMY Right 09/04/2017   Procedure: CRANIOTOMY FOR SUBDURAL HEMATOMA;  Surgeon: Erline Levine, MD;  Location: [place];  Service: Neurosurgery;  Laterality: Right;    Current Outpatient Medications  Medication Sig Dispense Refill   acyclovir (ZOVIRAX) 800 MG tablet  TAKE 1 TABLET (800 MG TOTAL) BY MOUTH 3 (THREE) TIMES DAILY AS NEEDED. 30 tablet 3   atorvastatin (LIPITOR) 40 MG tablet Take 1 tablet (40 mg total) by mouth daily. 90 tablet 3   CALCIUM-MAGNESIUM-ZINC PO Take 1 capsule by mouth daily.     cholecalciferol (VITAMIN D) 1000 units tablet Take 1,000 Units by mouth daily.     Coenzyme Q10 (CO Q 10 PO) Take 1 capsule by mouth daily.     cyclobenzaprine (FLEXERIL) 10 MG tablet TAKE ONE TABLET BY MOUTH THREE TIMES A DAY AS NEEDED FOR MUSCLE SPASMS 15 tablet 0   diltiazem (CARDIZEM) 30 MG tablet Take 1 tablet every 4 hours AS NEEDED for heart rate >100 30 tablet 1   Multiple Vitamin (MULTIVITAMIN) capsule Take 1 capsule by mouth daily.     No current facility-administered medications for this encounter.    Allergies  Allergen Reactions   Ativan [Lorazepam]     irritable and agitated    Social History   Socioeconomic History   Marital status: Divorced    Spouse name: Not on file   Number of children: Not on file   Years of education: Not on file   Highest education level: Not on file  Occupational History   Not on file  Tobacco Use   Smoking status: Former    Types: Cigarettes    Quit date: 10/16/1998    Years since quitting: 22.0   Smokeless tobacco:  Never  Vaping Use   Vaping Use: Never used  Substance and Sexual Activity   Alcohol use: Yes    Alcohol/week: 14.0 - 21.0 standard drinks    Types: 14 - 21 Glasses of wine per week    Comment: red wine   Drug use: No   Sexual activity: Yes    Partners: Female  Other Topics Concern   Not on file  Social History Narrative   Has a Licensed conveyancer company since 1978      Daughter who is 61 and a Son who is 76   - Daughter in [place], Son is in [place].       He likes to hike, scuba dive, and travel      Lives in single story home    [name] - significant other   4 year degree   Right handed    Social Determinants of Health   Financial Resource Strain: Not on file  Food  Insecurity: Not on file  Transportation Needs: Not on file  Physical Activity: Sufficiently Active   Days of Exercise per Week: 5 days   Minutes of Exercise per Session: 120 min  Stress: No Stress Concern Present   Feeling of Stress : Not at all  Social Connections: Moderately Integrated   Frequency of Communication with Friends and Family: More than three times a week   Frequency of Social Gatherings with Friends and Family: More than three times a week   Attends Religious Services: Never   Marine scientist or Organizations: Yes   Attends Archivist Meetings: 1 to 4 times per year   Marital Status: Living with partner  Intimate Partner Violence: Not At Risk   Fear of Current or Ex-Partner: No   Emotionally Abused: No   Physically Abused: No   Sexually Abused: No    Family History  Problem Relation Age of Onset   Diabetes Father    Stroke Father    Hypertension Mother        ?   Heart attack Maternal Grandfather    Colon cancer Neg Hx     ROS- All systems are reviewed and negative except as per the HPI above  Physical Exam: Vitals:   10/27/20 1031  BP: 110/66  Pulse: 67  Weight: 82.1 kg  Height: '6\' 1"'$  (1.854 m)   Wt Readings from Last 3 Encounters:  10/27/20 82.1 kg  04/09/20 87.4 kg  11/25/19 84.4 kg    Labs: Lab Results  Component Value Date   NA 134 (L) 10/14/2020   K 3.7 10/14/2020   CL 101 10/14/2020   CO2 23 10/14/2020   GLUCOSE 95 10/14/2020   BUN 25 (H) 10/14/2020   CREATININE 1.04 10/14/2020   CALCIUM 9.6 10/14/2020   PHOS 3.5 09/09/2017   MG 1.9 10/14/2020   Lab Results  Component Value Date   INR 1.00 09/11/2017   Lab Results  Component Value Date   CHOL 158 11/25/2019   HDL 53 11/25/2019   LDLCALC 84 11/25/2019   TRIG 117 11/25/2019     GEN- The patient is well appearing, alert and oriented x 3 today.   Head- normocephalic, atraumatic Eyes-  Sclera clear, conjunctiva pink Ears- hearing intact Oropharynx-  clear Neck- supple, no JVP Lymph- no cervical lymphadenopathy Lungs- Clear to ausculation bilaterally, normal work of breathing Heart- Regular rate and rhythm, no murmurs, rubs or gallops, PMI not laterally displaced GI- soft, NT, ND, + BS Extremities- no clubbing,  cyanosis, or edema MS- no significant deformity or atrophy Skin- no rash or lesion Psych- euthymic mood, full affect Neuro- strength and sensation are intact  ER Ekg 8/11 revealed SR   EKG-Vent. rate 67 BPM PR interval 142 ms QRS duration 80 ms QT/QTcB 374/395 ms P-R-T axes 78 47 -13 Normal sinus rhythm Abnormal QRS-T angle, consider primary T wave abnormality Abnormal ECG    Assessment and Plan:  1. SVT Did not get to document arrhythmia.  Lasted 12 hours with v rates 160-170 bpm   Spontaneously broke on entrance to the ER  Trigger may have alcohol, caffeine and lack of use of CPAP Avoid alcohol, excessive caffeine  Use CPAP I will rx 30 mg Cardizem if has recurrent episodes If he does, he needs to get an EKG first so the arrhythmia can be documented  If recurrent, he may need to f/u with Dr. Rayann Heman   2. CHA2DS2VASc  score of 1 Does not need to go on anticoagulation by guidelines    Afib clinic as needed   Butch Penny C. Kyser Wandel, [facility] [address] [place], [place] [address] [phone]

## 2020-11-15 DIAGNOSIS — L821 Other seborrheic keratosis: Secondary | ICD-10-CM | POA: Diagnosis not present

## 2020-11-15 DIAGNOSIS — L578 Other skin changes due to chronic exposure to nonionizing radiation: Secondary | ICD-10-CM | POA: Diagnosis not present

## 2020-11-15 DIAGNOSIS — L570 Actinic keratosis: Secondary | ICD-10-CM | POA: Diagnosis not present

## 2020-11-15 DIAGNOSIS — Z85828 Personal history of other malignant neoplasm of skin: Secondary | ICD-10-CM | POA: Diagnosis not present

## 2020-11-15 DIAGNOSIS — D225 Melanocytic nevi of trunk: Secondary | ICD-10-CM | POA: Diagnosis not present

## 2020-11-15 DIAGNOSIS — L814 Other melanin hyperpigmentation: Secondary | ICD-10-CM | POA: Diagnosis not present

## 2020-11-18 ENCOUNTER — Other Ambulatory Visit: Payer: Self-pay | Admitting: Adult Health

## 2020-12-06 DIAGNOSIS — H353131 Nonexudative age-related macular degeneration, bilateral, early dry stage: Secondary | ICD-10-CM | POA: Diagnosis not present

## 2020-12-06 DIAGNOSIS — H04123 Dry eye syndrome of bilateral lacrimal glands: Secondary | ICD-10-CM | POA: Diagnosis not present

## 2020-12-06 DIAGNOSIS — H2513 Age-related nuclear cataract, bilateral: Secondary | ICD-10-CM | POA: Diagnosis not present

## 2020-12-06 DIAGNOSIS — H401131 Primary open-angle glaucoma, bilateral, mild stage: Secondary | ICD-10-CM | POA: Diagnosis not present

## 2020-12-08 ENCOUNTER — Encounter: Payer: Self-pay | Admitting: Adult Health

## 2020-12-08 ENCOUNTER — Other Ambulatory Visit: Payer: Self-pay

## 2020-12-08 ENCOUNTER — Ambulatory Visit (INDEPENDENT_AMBULATORY_CARE_PROVIDER_SITE_OTHER): Payer: Medicare Other | Admitting: Adult Health

## 2020-12-08 VITALS — BP 118/82 | HR 53 | Temp 97.6°F | Ht 72.0 in | Wt 178.0 lb

## 2020-12-08 DIAGNOSIS — B009 Herpesviral infection, unspecified: Secondary | ICD-10-CM

## 2020-12-08 DIAGNOSIS — I483 Typical atrial flutter: Secondary | ICD-10-CM

## 2020-12-08 DIAGNOSIS — E785 Hyperlipidemia, unspecified: Secondary | ICD-10-CM | POA: Diagnosis not present

## 2020-12-08 DIAGNOSIS — G4733 Obstructive sleep apnea (adult) (pediatric): Secondary | ICD-10-CM | POA: Diagnosis not present

## 2020-12-08 DIAGNOSIS — Z125 Encounter for screening for malignant neoplasm of prostate: Secondary | ICD-10-CM | POA: Diagnosis not present

## 2020-12-08 DIAGNOSIS — Z9989 Dependence on other enabling machines and devices: Secondary | ICD-10-CM

## 2020-12-08 LAB — CBC WITH DIFFERENTIAL/PLATELET
Basophils Absolute: 0 10*3/uL (ref 0.0–0.1)
Basophils Relative: 1 % (ref 0.0–3.0)
Eosinophils Absolute: 0.2 10*3/uL (ref 0.0–0.7)
Eosinophils Relative: 3.8 % (ref 0.0–5.0)
HCT: 41.9 % (ref 39.0–52.0)
Hemoglobin: 14 g/dL (ref 13.0–17.0)
Lymphocytes Relative: 36.7 % (ref 12.0–46.0)
Lymphs Abs: 1.6 10*3/uL (ref 0.7–4.0)
MCHC: 33.4 g/dL (ref 30.0–36.0)
MCV: 102.5 fl — ABNORMAL HIGH (ref 78.0–100.0)
Monocytes Absolute: 0.6 10*3/uL (ref 0.1–1.0)
Monocytes Relative: 14.6 % — ABNORMAL HIGH (ref 3.0–12.0)
Neutro Abs: 1.9 10*3/uL (ref 1.4–7.7)
Neutrophils Relative %: 43.9 % (ref 43.0–77.0)
Platelets: 194 10*3/uL (ref 150.0–400.0)
RBC: 4.09 Mil/uL — ABNORMAL LOW (ref 4.22–5.81)
RDW: 13.9 % (ref 11.5–15.5)
WBC: 4.4 10*3/uL (ref 4.0–10.5)

## 2020-12-08 LAB — COMPREHENSIVE METABOLIC PANEL
ALT: 28 U/L (ref 0–53)
AST: 28 U/L (ref 0–37)
Albumin: 4.5 g/dL (ref 3.5–5.2)
Alkaline Phosphatase: 52 U/L (ref 39–117)
BUN: 24 mg/dL — ABNORMAL HIGH (ref 6–23)
CO2: 29 mEq/L (ref 19–32)
Calcium: 9.9 mg/dL (ref 8.4–10.5)
Chloride: 98 mEq/L (ref 96–112)
Creatinine, Ser: 0.93 mg/dL (ref 0.40–1.50)
GFR: 85.06 mL/min (ref >60.00)
Glucose, Bld: 90 mg/dL (ref 70–99)
Potassium: 4.3 mEq/L (ref 3.5–5.1)
Sodium: 135 mEq/L (ref 135–145)
Total Bilirubin: 1.5 mg/dL — ABNORMAL HIGH (ref 0.2–1.2)
Total Protein: 7.6 g/dL (ref 6.0–8.3)

## 2020-12-08 LAB — PSA: PSA: 1.03 ng/mL (ref 0.10–4.00)

## 2020-12-08 LAB — LIPID PANEL
Cholesterol: 158 mg/dL (ref 0–200)
HDL: 55.8 mg/dL (ref >39.00)
LDL Cholesterol: 88 mg/dL (ref 0–99)
NonHDL: 101.88
Total CHOL/HDL Ratio: 3
Triglycerides: 68 mg/dL (ref 0.0–149.0)
VLDL: 13.6 mg/dL (ref 0.0–40.0)

## 2020-12-08 LAB — TSH: TSH: 2.37 u[IU]/mL (ref 0.35–5.50)

## 2020-12-08 MED ORDER — ACYCLOVIR 800 MG PO TABS: 800.0000 mg | ORAL_TABLET | Freq: Three times a day (TID) | ORAL | 3 refills | Status: DC | PRN

## 2020-12-08 MED ORDER — CYCLOBENZAPRINE HCL 10 MG PO TABS: ORAL_TABLET | ORAL | 0 refills | Status: DC

## 2020-12-08 NOTE — Patient Instructions (Signed)
It was great seeing you today   We will follow up with you regarding your blood work   I will see you back in one year or sooner if needed 

## 2020-12-08 NOTE — Progress Notes (Signed)
Subjective:    Patient ID: Steven Byrd, male    DOB: 1955-05-12, 65 y.o.   MRN: [id number]  HPI Patient presents for yearly preventative medicine examination. He is a pleasant 65 year old male who  has a past medical history of AAA (abdominal aortic aneurysm) (2020), Atrial flutter ([place]), Dyslipidemia, OSA on CPAP (01/20/2014), and Paroxysmal atrial fibrillation ([place]).  Hyperlipidemia-tcontrolled with Lipitor 40 mg daily.  He denies myalgia or fatigue Lab Results  Component Value Date   CHOL 158 11/25/2019   HDL 53 11/25/2019   LDLCALC 84 11/25/2019   LDLDIRECT 126.8 02/01/2010   TRIG 117 11/25/2019   CHOLHDL 3.0 11/25/2019   OSA-uses CPAP nightly.  Denies daytime somnolence, waking up feeling fatigued, or the need for naps.  H/o Atrial Flutter -status post ablation in 2019.  He was seen in the emergency room on 10/14/2020 after noticing heart rate of 160 to 170 bpm via his apple watch that morning.  This was prior to playing golf and after finishing playing 18 holes of golf his heart rate had not returned to normal, he proceeded to the emergency room.  On walking to the ER he went back into normal sinus rhythm.  He did follow-up with cardiology on 10/27/2020.  Reports trigger may have been alcohol, caffeine, and lack of CPAP use while on a [place] cruise.  He was prescribed a prescription for 30 mg of Cardizem if he has recurrent symptoms.  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  Appropriate screening laboratory values were ordered for the patient including screening of hyperlipidemia, renal function and hepatic function. If indicated by BPH, a PSA was ordered.  Medication reconciliation,  past medical history, social history, problem list and allergies were reviewed in detail with the patient  Goals were established with regard  to weight loss, exercise, and  diet in compliance with medications. He is going to the gym 3 days a week and has taken up hiking and golfing.   Wt Readings from Last 3 Encounters:  12/08/20 178 lb (80.7 kg)  10/27/20 181 lb (82.1 kg)  04/09/20 192 lb 9.6 oz (87.4 kg)    Review of Systems  Constitutional: Negative.   HENT: Negative.    Eyes: Negative.   Respiratory: Negative.    Cardiovascular: Negative.   Gastrointestinal: Negative.   Endocrine: Negative.   Genitourinary: Negative.   Musculoskeletal: Negative.   Skin: Negative.   Allergic/Immunologic: Negative.   Neurological: Negative.   Hematological: Negative.   Psychiatric/Behavioral: Negative.    All other systems reviewed and are negative.  Past Medical History:  Diagnosis Date   AAA (abdominal aortic aneurysm) 2020   3.2 cm in 2020    Atrial flutter (HCC)    Dyslipidemia    OSA on CPAP 01/20/2014   Paroxysmal atrial fibrillation (HCC)     Social History   Socioeconomic History   Marital status: Divorced    Spouse name: Not on file   Number of children: Not on file   Years of education: Not on file   Highest education level: Not on file  Occupational History   Not on file  Tobacco Use   Smoking status: Former    Types: Cigarettes    Quit date: 10/16/1998    Years since quitting: 22.1   Smokeless tobacco: Never  Vaping Use  Vaping Use: Never used  Substance and Sexual Activity   Alcohol use: Yes    Alcohol/week: 14.0 - 21.0 standard drinks    Types: 14 - 21 Glasses of wine per week    Comment: red wine   Drug use: No   Sexual activity: Yes    Partners: Female  Other Topics Concern   Not on file  Social History Narrative   Has a Licensed conveyancer company since 1978      Daughter who is 55 and a Son who is 45   - Daughter in [place], Son is in [place].       He likes to hike, scuba dive, and travel      Lives in single story home    [name] - significant other   4 year degree   Right handed     Social Determinants of Health   Financial Resource Strain: Not on file  Food Insecurity: Not on file  Transportation Needs: Not on file  Physical Activity: Sufficiently Active   Days of Exercise per Week: 5 days   Minutes of Exercise per Session: 120 min  Stress: No Stress Concern Present   Feeling of Stress : Not at all  Social Connections: Moderately Integrated   Frequency of Communication with Friends and Family: More than three times a week   Frequency of Social Gatherings with Friends and Family: More than three times a week   Attends Religious Services: Never   Marine scientist or Organizations: Yes   Attends Archivist Meetings: 1 to 4 times per year   Marital Status: Living with partner  Intimate Partner Violence: Not At Risk   Fear of Current or Ex-Partner: No   Emotionally Abused: No   Physically Abused: No   Sexually Abused: No    Past Surgical History:  Procedure Laterality Date   A-FLUTTER ABLATION N/A 03/22/2017   Procedure: A-FLUTTER ABLATION;  Surgeon: Thompson Grayer, MD;  Location: [place] [facility];  Service: Cardiovascular;  Laterality: N/A;   CRANIOTOMY Right 09/04/2017   Procedure: CRANIOTOMY FOR SUBDURAL HEMATOMA;  Surgeon: Erline Levine, MD;  Location: [facility];  Service: Neurosurgery;  Laterality: Right;    Family History  Problem Relation Age of Onset   Diabetes Father    Stroke Father    Hypertension Mother        ?   Heart attack Maternal Grandfather    Colon cancer Neg Hx     Allergies  Allergen Reactions   Ativan [Lorazepam]     irritable and agitated    Current Outpatient Medications on File Prior to Visit  Medication Sig Dispense Refill   acyclovir (ZOVIRAX) 800 MG tablet TAKE 1 TABLET (800 MG TOTAL) BY MOUTH 3 (THREE) TIMES DAILY AS NEEDED. 30 tablet 3   atorvastatin (LIPITOR) 40 MG tablet TAKE ONE TABLET BY MOUTH DAILY 90 tablet 3   CALCIUM-MAGNESIUM-ZINC PO Take 1 capsule by mouth daily.     cholecalciferol (VITAMIN  D) 1000 units tablet Take 1,000 Units by mouth daily.     Coenzyme Q10 (CO Q 10 PO) Take 1 capsule by mouth daily.     cyclobenzaprine (FLEXERIL) 10 MG tablet TAKE ONE TABLET BY MOUTH THREE TIMES A DAY AS NEEDED FOR MUSCLE SPASMS 15 tablet 0   diltiazem (CARDIZEM) 30 MG tablet Take 1 tablet every 4 hours AS NEEDED for heart rate >100 30 tablet 1   Multiple Vitamin (MULTIVITAMIN) capsule Take 1 capsule by mouth daily.  No current facility-administered medications on file prior to visit.    BP 118/82   Pulse (!) 53   Temp 97.6 F (36.4 C) (Oral)   Ht 6' (1.829 m)   Wt 178 lb (80.7 kg)   SpO2 99%   BMI 24.14 kg/m        Objective:   Physical Exam Vitals and nursing note reviewed.  Constitutional:      General: He is not in acute distress.    Appearance: Normal appearance. He is well-developed and normal weight.  HENT:     Head: Normocephalic and atraumatic.     Right Ear: Tympanic membrane, ear canal and external ear normal. There is no impacted cerumen.     Left Ear: Tympanic membrane, ear canal and external ear normal. There is no impacted cerumen.     Nose: Nose normal. No congestion or rhinorrhea.     Mouth/Throat:     Mouth: Mucous membranes are moist.     Pharynx: Oropharynx is clear. No oropharyngeal exudate or posterior oropharyngeal erythema.  Eyes:     General:        Right eye: No discharge.        Left eye: No discharge.     Extraocular Movements: Extraocular movements intact.     Conjunctiva/sclera: Conjunctivae normal.     Pupils: Pupils are equal, round, and reactive to light.  Neck:     Vascular: No carotid bruit.     Trachea: No tracheal deviation.  Cardiovascular:     Rate and Rhythm: Normal rate and regular rhythm.     Pulses: Normal pulses.     Heart sounds: Normal heart sounds. No murmur heard.   No friction rub. No gallop.  Pulmonary:     Effort: Pulmonary effort is normal. No respiratory distress.     Breath sounds: Normal breath sounds. No  stridor. No wheezing, rhonchi or rales.  Chest:     Chest wall: No tenderness.  Abdominal:     General: Bowel sounds are normal. There is no distension.     Palpations: Abdomen is soft. There is no mass.     Tenderness: There is no abdominal tenderness. There is no right CVA tenderness, left CVA tenderness, guarding or rebound.     Hernia: No hernia is present.  Musculoskeletal:        General: No swelling, tenderness, deformity or signs of injury. Normal range of motion.     Right lower leg: No edema.     Left lower leg: No edema.  Lymphadenopathy:     Cervical: No cervical adenopathy.  Skin:    General: Skin is warm and dry.     Capillary Refill: Capillary refill takes less than 2 seconds.     Coloration: Skin is not jaundiced or pale.     Findings: No bruising, erythema, lesion or rash.  Neurological:     General: No focal deficit present.     Mental Status: He is alert and oriented to person, place, and time.     Cranial Nerves: No cranial nerve deficit.     Sensory: No sensory deficit.     Motor: No weakness.     Coordination: Coordination normal.     Gait: Gait normal.     Deep Tendon Reflexes: Reflexes normal.  Psychiatric:        Mood and Affect: Mood normal.        Behavior: Behavior normal.        Thought Content: Thought  content normal.        Judgment: Judgment normal.      Assessment & Plan:  1. Dyslipidemia - Continue with statin  - Follow up in one year or sooner if needed - Continue to stay active and eat healthy  - CBC with Differential/Platelet; Future - Comprehensive metabolic panel; Future - Lipid panel; Future - TSH; Future  2. Prostate cancer screening  - PSA; Future  3. OSA on CPAP - Continue to use CPAP nightly.  - CBC with Differential/Platelet; Future - Comprehensive metabolic panel; Future - Lipid panel; Future - TSH; Future  4. Typical atrial flutter (HCC)  - CBC with Differential/Platelet; Future - Comprehensive metabolic panel;  Future - Lipid panel; Future - TSH; Future   5. HSV-1 (herpes simplex virus 1) infection  - acyclovir (ZOVIRAX) 800 MG tablet; Take 1 tablet (800 mg total) by mouth 3 (three) times daily as needed.  Dispense: 30 tablet; Refill: 3  Dorothyann Peng, NP

## 2020-12-23 ENCOUNTER — Telehealth: Payer: Self-pay | Admitting: Internal Medicine

## 2020-12-23 DIAGNOSIS — E785 Hyperlipidemia, unspecified: Secondary | ICD-10-CM

## 2020-12-23 NOTE — Telephone Encounter (Signed)
Patient requesting to have a calcium score test. He says he asked his PCP and they told him to ask his cardiologist.

## 2020-12-23 NOTE — Telephone Encounter (Signed)
Will forward to Dr Debara Pickett for recommendations .Adonis Housekeeper

## 2020-12-28 NOTE — Telephone Encounter (Signed)
Calcium score test ordered Patient notified and advised he will get a call to schedule

## 2020-12-28 NOTE — Telephone Encounter (Signed)
Ok to order a calcium score for him under my name  Dr Lemmie Evens

## 2021-01-24 ENCOUNTER — Ambulatory Visit
Admission: RE | Admit: 2021-01-24 | Discharge: 2021-01-24 | Disposition: A | Discharge status: Home / Self Care | Payer: Self-pay | Source: Ambulatory Visit | Attending: Internal Medicine | Admitting: Internal Medicine

## 2021-01-24 ENCOUNTER — Other Ambulatory Visit: Payer: Self-pay

## 2021-01-24 DIAGNOSIS — E785 Hyperlipidemia, unspecified: Secondary | ICD-10-CM

## 2021-02-07 DIAGNOSIS — Z23 Encounter for immunization: Secondary | ICD-10-CM | POA: Diagnosis not present

## 2021-02-09 ENCOUNTER — Other Ambulatory Visit: Payer: Self-pay | Admitting: Adult Health

## 2021-02-09 DIAGNOSIS — B009 Herpesviral infection, unspecified: Secondary | ICD-10-CM

## 2021-02-17 ENCOUNTER — Ambulatory Visit (HOSPITAL_BASED_OUTPATIENT_CLINIC_OR_DEPARTMENT_OTHER): Payer: Medicare Other | Admitting: Internal Medicine

## 2021-02-21 NOTE — Progress Notes (Signed)
Office Visit    Patient Name: Steven Byrd Date of Encounter: 02/22/2021  PCP:  Dorothyann Peng, NP   [place]  Cardiologist:  Pixie Casino, MD  Advanced Practice Provider:  No care team member to display Electrophysiologist:  None      Chief Complaint    Steven Byrd is a 65 y.o. male with a hx of coronary artery disease, hyperlipidemia, atrial flutter s/p ablation presents today for follow up after coronary calcium scoring   Past Medical History    Past Medical History:  Diagnosis Date   AAA (abdominal aortic aneurysm) 2020   3.2 cm in 2020    Atrial flutter ([place])    Dyslipidemia    OSA on CPAP 01/20/2014   Paroxysmal atrial fibrillation ([place])    Past Surgical History:  Procedure Laterality Date   A-FLUTTER ABLATION N/A 03/22/2017   Procedure: A-FLUTTER ABLATION;  Surgeon: Thompson Grayer, MD;  Location: [facility];  Service: Cardiovascular;  Laterality: N/A;   CRANIOTOMY Right 09/04/2017   Procedure: CRANIOTOMY FOR SUBDURAL HEMATOMA;  Surgeon: Erline Levine, MD;  Location: [facility];  Service: Neurosurgery;  Laterality: Right;    Allergies  Allergies  Allergen Reactions   Ativan [Lorazepam]     irritable and agitated    History of Present Illness    Steven Byrd is a 65 y.o. male with a hx of coronary artery disease, hyperlipidemia, atrial flutter s/p ablation 03/12/17, OSA last seen 10/27/20 by atrial fibrillation clinic.  He had ED visit 10/2020 due to palpitations. His heart rate was 160-170 bpm in setting of playing golf, not using CPAP, alcohol the evening before, and increased caffeine. The arrhythmia was not able to be documented by EKG in the ED. Presumed episode of SVT. He was provided PRN Diltiazem.   He requested coronary calcium score as multiple friends had cardiac events. Coronary calcium score of 1517 placing him in the 94th percentile for age, race, and sex matched controls. Very pleasant gentleman who  presents today for follow up. Reviewed coronary calcium score and secondary prevention of coronary disease. He just returned from a trip to [place] visiting two of his grandchildren. 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 Recently [facility] without chest pain nor exertional dyspnea. He also does strength training a couple times per week at the gym. He endorses following a heart healthy diet. He and his significant other cook mostly at home. Reports no shortness of breath nor dyspnea on exertion. Reports no chest pain, pressure, or tightness. No edema, orthopnea, PND. Reports no palpitations.     EKGs/Labs/Other Studies Reviewed:   The following studies were reviewed today:  Coronary calcium scoring FINDINGS: Coronary Calcium Score:   Left main: 0   Left anterior descending artery: 394   Left circumflex artery: 98   Right coronary artery: 1024   Total: 1517   Percentile: 94th   Pericardium: Normal.   Non-cardiac: See separate report from [facility].   IMPRESSION: Coronary calcium score of 1517. This was 94th percentile for age-, race-, and sex-matched controls.  EKG:  EKG is ordered today.  The ekg ordered today demonstrates SB 57 bpm with no acute ST/T wave changes.   Recent Labs: 10/14/2020: Magnesium 1.9 12/08/2020: ALT 28; BUN 24; Creatinine, Ser 0.93; Hemoglobin 14.0; Platelets 194.0; Potassium 4.3; Sodium 135; TSH 2.37  Recent Lipid Panel    Component Value Date/Time  CHOL 158 12/08/2020 0934   TRIG 68.0 12/08/2020 0934   HDL 55.80 12/08/2020 0934   CHOLHDL 3 12/08/2020 0934   VLDL 13.6 12/08/2020 0934   LDLCALC 88 12/08/2020 0934   LDLCALC 84 11/25/2019 0835   LDLDIRECT 126.8 02/01/2010 0923    Home Medications   Current Meds  Medication Sig   acyclovir (ZOVIRAX) 800 MG tablet TAKE ONE TABLET BY MOUTH THREE TIMES A DAY AS NEEDED   aspirin EC 81 MG tablet Take 1 tablet (81 mg total) by  mouth daily. Swallow whole.   CALCIUM-MAGNESIUM-ZINC PO Take 1 capsule by mouth daily.   cholecalciferol (VITAMIN D) 1000 units tablet Take 1,000 Units by mouth daily.   Coenzyme Q10 (CO Q 10 PO) Take 1 capsule by mouth daily.   cyclobenzaprine (FLEXERIL) 10 MG tablet TAKE ONE TABLET BY MOUTH THREE TIMES A DAY AS NEEDED FOR MUSCLE SPASMS   diltiazem (CARDIZEM) 30 MG tablet Take 1 tablet every 4 hours AS NEEDED for heart rate >100   metoprolol tartrate (LOPRESSOR) 25 MG tablet Take one tablet two hours prior to cardiac CTA.   Multiple Vitamin (MULTIVITAMIN) capsule Take 1 capsule by mouth daily.   nitroGLYCERIN (NITROSTAT) 0.4 MG SL tablet Place 1 tablet (0.4 mg total) under the tongue every 5 (five) minutes as needed for chest pain (If you take 3 tablets without resolution of chest pain, proceed to ER for evaluation.).   rosuvastatin (CRESTOR) 40 MG tablet Take 1 tablet (40 mg total) by mouth daily.   [DISCONTINUED] atorvastatin (LIPITOR) 40 MG tablet TAKE ONE TABLET BY MOUTH DAILY     Review of Systems      All other systems reviewed and are otherwise negative except as noted above.  Physical Exam    VS:  BP 116/74    Pulse (!) 57    Ht 6' (1.829 m)    Wt 183 lb (83 kg)    BMI 24.82 kg/m  , BMI Body mass index is 24.82 kg/m.  Wt Readings from Last 3 Encounters:  02/22/21 183 lb (83 kg)  12/08/20 178 lb (80.7 kg)  10/27/20 181 lb (82.1 kg)     GEN: Well nourished, well developed, in no acute distress. HEENT: normal. Neck: Supple, no JVD, carotid bruits, or masses. Cardiac: RRR, no murmurs, rubs, or gallops. No clubbing, cyanosis, edema.  Radials/PT 2+ and equal bilaterally.  Respiratory:  Respirations regular and unlabored, clear to auscultation bilaterally. GI: Soft, nontender, nondistended. MS: No deformity or atrophy. Skin: Warm and dry, no rash. Neuro:  Strength and sensation are intact. Psych: Normal affect.  Assessment & Plan    CAD - Coronary calcium score of 1517  placing him in 94th percentile. He reports no chest pain nor dyspnea. Exercises regularly without difficulty. Plan for cardiac CTA to further evaluate coronary arteries and rule out ischemia. Metoprolol 25mg  two hours prior to cardiac CT. BMP today to assess renal function prior to scan. If cardiac CT with evidence of reduced FFR, plan for cardiac cath. For optimization of GDMT start Aspirin 81mg  QD and transition Atorvastatin to Rosuvastatin. No beta blocker due to baseline bradycardia. 150 minutes of exercise each week per AHA guidelines and heart healthy diet recommended.   HLD, LDL goal <70 - 12/08/20 total cholesterol 158, HDL 55.8, LDL 88, triglycerides 68. He requests NMR profile for LDL particle size and lipoprotein a to assess for familial hyperlipidemia. Prefers to avoid injectable therapy. Will stop Atorvastatin 40mg  daily and start Rosuvastatin 40mg  daily. Repeat  CMP, lipid panel in 6 weeks. If LDL not at goal at that time plan to add Zetia 10mg  QD.   Paroxysmal atrial flutter - S/p ablation 2019. Episode of SVT 10/2020 in setting of caffeine, etoh, not using CPAP. Continue PRN Diltiazem. No recurrent palpitations. He has CHADS2VASc of 2 (>65, CAD) but without evidence of recurrent atrila flutter no indication for anticoagulation.   OSA - CPAP compliance encouraged.   Disposition: Follow up in 2 month(s) with Pixie Casino, MD or APP.  Signed, Loel Dubonnet, NP 02/22/2021, 8:46 PM  [facility]

## 2021-02-22 ENCOUNTER — Other Ambulatory Visit: Payer: Self-pay

## 2021-02-22 ENCOUNTER — Encounter (HOSPITAL_BASED_OUTPATIENT_CLINIC_OR_DEPARTMENT_OTHER): Payer: Self-pay | Admitting: Family

## 2021-02-22 ENCOUNTER — Ambulatory Visit (INDEPENDENT_AMBULATORY_CARE_PROVIDER_SITE_OTHER): Payer: Medicare Other | Admitting: Family

## 2021-02-22 VITALS — BP 116/74 | HR 57 | Ht 72.0 in | Wt 183.0 lb

## 2021-02-22 DIAGNOSIS — E7849 Other hyperlipidemia: Secondary | ICD-10-CM

## 2021-02-22 DIAGNOSIS — I2510 Atherosclerotic heart disease of native coronary artery without angina pectoris: Secondary | ICD-10-CM

## 2021-02-22 DIAGNOSIS — E785 Hyperlipidemia, unspecified: Secondary | ICD-10-CM

## 2021-02-22 DIAGNOSIS — R931 Abnormal findings on diagnostic imaging of heart and coronary circulation: Secondary | ICD-10-CM | POA: Diagnosis not present

## 2021-02-22 DIAGNOSIS — I483 Typical atrial flutter: Secondary | ICD-10-CM

## 2021-02-22 MED ORDER — METOPROLOL TARTRATE 25 MG PO TABS: ORAL_TABLET | ORAL | 0 refills | Status: DC

## 2021-02-22 MED ORDER — NITROGLYCERIN 0.4 MG SL SUBL: 0.4000 mg | SUBLINGUAL_TABLET | SUBLINGUAL | 3 refills | Status: DC | PRN

## 2021-02-22 MED ORDER — ROSUVASTATIN CALCIUM 40 MG PO TABS: 40.0000 mg | ORAL_TABLET | Freq: Every day | ORAL | 3 refills | Status: DC

## 2021-02-22 MED ORDER — ASPIRIN EC 81 MG PO TBEC: 81.0000 mg | DELAYED_RELEASE_TABLET | Freq: Every day | ORAL | 3 refills | Status: AC

## 2021-02-22 NOTE — Patient Instructions (Addendum)
Medication Instructions:  Your physician has recommended you make the following change in your medication:    START Aspirin 81mg  daily   STOP Atorvastatin  START Rosuvastatin (Crestor) one 40mg  tablet daily  Take one 25mg  tablet of Metoprolol two hours prior to your cardiac CTA  *If you need a refill on your cardiac medications before your next appointment, please call your pharmacy*   Lab Work Your physician recommends that you return for lab work today: lipoprotein a, lipid panel, CMP, NMR profile  Your physician recommends that you return for lab work in 6 weeks for fasting lipid panel, CMP   If you have labs (blood work) drawn today and your tests are completely normal, you will receive your results only by: [place] (if you have MyChart) OR A paper copy in the mail If you have any lab test that is abnormal or we need to change your treatment, we will call you to review the results.   Testing/Procedures: Your physician has requested that you have cardiac CT. Cardiac computed tomography (CT) is a painless test that uses an x-ray machine to take clear, detailed pictures of your heart. Please follow instruction sheet as given.   Follow-Up: At [facility], you and your health needs are our priority.  As part of our continuing mission to provide you with exceptional heart care, we have created designated Provider Care Teams.  These Care Teams include your primary Cardiologist (physician) and Advanced Practice Providers (APPs -  Physician Assistants and Nurse Practitioners) who all work together to provide you with the care you need, when you need it.  We recommend signing up for the patient portal called "MyChart".  Sign up information is provided on this After Visit Summary.  MyChart is used to connect with patients for Virtual Visits (Telemedicine).  Patients are able to view lab/test results, encounter notes, upcoming appointments, etc.  Non-urgent messages can be sent to  your provider as well.   To learn more about what you can do with MyChart, go to [email or web address].    Your next appointment:   2 month(s)  The format for your next appointment:   In Person  Provider:   Pixie Casino, MD or Loel Dubonnet, NP    Other Instructions  Heart Healthy Diet Recommendations: A low-salt diet is recommended. Meats should be grilled, baked, or boiled. Avoid fried foods. Focus on lean protein sources like fish or chicken with vegetables and fruits. The American Heart Association is a Microbiologist!  American Heart Association Diet and Lifeystyle Recommendations    Exercise recommendations: The American Heart Association recommends 150 minutes of moderate intensity exercise weekly. Try 30 minutes of moderate intensity exercise 4-5 times per week. This could include walking, jogging, or swimming.      Your cardiac CT will be scheduled at:  [facility] [address] [place], [place] [address] [phone]  If scheduled at [facility], please arrive at the [facility] main entrance (entrance A) of [facility] 30 minutes prior to test start time. You can use the FREE valet parking offered at the main entrance (encouraged to control the heart rate for the test) Proceed to the [facility] Radiology Department (first floor) to check-in and test prep.  Please follow these instructions carefully (unless otherwise directed):  Hold all erectile dysfunction medications at least 3 days (72 hrs) prior to test.  On the Night Before the Test: Be sure to Drink plenty of water. Do  not consume any caffeinated/decaffeinated beverages or chocolate 12 hours prior to your test. Do not take any antihistamines 12 hours prior to your test.  On the Day of the Test: Drink plenty of water until 1 hour prior to the test. Do not eat any food 4 hours prior to the test. You may take your regular medications prior to the test.  Take  metoprolol (Lopressor) two hours prior to test.  After the Test: Drink plenty of water. After receiving IV contrast, you may experience a mild flushed feeling. This is normal. On occasion, you may experience a mild rash up to 24 hours after the test. This is not dangerous. If this occurs, you can take Benadryl 25 mg and increase your fluid intake. If you experience trouble breathing, this can be serious. If it is severe call 911 IMMEDIATELY. If it is mild, please call our office. If you take any of these medications: Glipizide/Metformin, Avandament, Glucavance, please do not take 48 hours after completing test unless otherwise instructed.  Please allow 2-4 weeks for scheduling of routine cardiac CTs. Some insurance companies require a pre-authorization which may delay scheduling of this test.   For non-scheduling related questions, please contact the cardiac imaging nurse navigator should you have any questions/concerns: Marchia Bond, Cardiac Imaging Nurse Navigator Gordy Clement, Cardiac Imaging Nurse Navigator [facility] Direct Office Dial: [phone]   For scheduling needs, including cancellations and rescheduling, please call [place], [phone].

## 2021-02-23 LAB — COMPREHENSIVE METABOLIC PANEL
ALT: 21 IU/L (ref 0–44)
AST: 24 IU/L (ref 0–40)
Albumin/Globulin Ratio: 1.8 (ref 1.2–2.2)
Albumin: 4.8 g/dL (ref 3.8–4.8)
Alkaline Phosphatase: 69 IU/L (ref 44–121)
BUN/Creatinine Ratio: 19 (ref 10–24)
BUN: 22 mg/dL (ref 8–27)
Bilirubin Total: 0.9 mg/dL (ref 0.0–1.2)
CO2: 19 mmol/L — ABNORMAL LOW (ref 20–29)
Calcium: 10.1 mg/dL (ref 8.6–10.2)
Chloride: 104 mmol/L (ref 96–106)
Creatinine, Ser: 1.15 mg/dL (ref 0.76–1.27)
Globulin, Total: 2.7 g/dL (ref 1.5–4.5)
Glucose: 118 mg/dL — ABNORMAL HIGH (ref 70–99)
Potassium: 5.2 mmol/L (ref 3.5–5.2)
Sodium: 145 mmol/L — ABNORMAL HIGH (ref 134–144)
Total Protein: 7.5 g/dL (ref 6.0–8.5)
eGFR: 70 mL/min/{1.73_m2} (ref >59)

## 2021-02-23 LAB — NMR, LIPOPROFILE
Cholesterol, Total: 158 mg/dL (ref 100–199)
HDL Particle Number: 44.9 umol/L (ref >=30.5)
HDL-C: 60 mg/dL (ref >39)
LDL Particle Number: 1139 nmol/L — ABNORMAL HIGH (ref <1000)
LDL Size: 20.9 nm (ref >20.5)
LDL-C (NIH Calc): 85 mg/dL (ref 0–99)
LP-IR Score: 44 (ref <=45)
Small LDL Particle Number: 518 nmol/L (ref <=527)
Triglycerides: 69 mg/dL (ref 0–149)

## 2021-02-23 LAB — LIPID PANEL
Chol/HDL Ratio: 2.7 ratio (ref 0.0–5.0)
Cholesterol, Total: 187 mg/dL (ref 100–199)
HDL: 69 mg/dL (ref >39)
LDL Chol Calc (NIH): 106 mg/dL — ABNORMAL HIGH (ref 0–99)
Triglycerides: 66 mg/dL (ref 0–149)
VLDL Cholesterol Cal: 12 mg/dL (ref 5–40)

## 2021-02-23 LAB — LIPOPROTEIN A (LPA): Lipoprotein (a): 36 nmol/L (ref <75.0)

## 2021-04-04 ENCOUNTER — Encounter (HOSPITAL_BASED_OUTPATIENT_CLINIC_OR_DEPARTMENT_OTHER): Payer: Self-pay

## 2021-04-05 DIAGNOSIS — E785 Hyperlipidemia, unspecified: Secondary | ICD-10-CM | POA: Diagnosis not present

## 2021-04-05 DIAGNOSIS — I2510 Atherosclerotic heart disease of native coronary artery without angina pectoris: Secondary | ICD-10-CM | POA: Diagnosis not present

## 2021-04-05 DIAGNOSIS — E7849 Other hyperlipidemia: Secondary | ICD-10-CM | POA: Diagnosis not present

## 2021-04-06 ENCOUNTER — Telehealth (HOSPITAL_BASED_OUTPATIENT_CLINIC_OR_DEPARTMENT_OTHER): Payer: Self-pay

## 2021-04-06 DIAGNOSIS — E785 Hyperlipidemia, unspecified: Secondary | ICD-10-CM

## 2021-04-06 LAB — COMPREHENSIVE METABOLIC PANEL
ALT: 22 IU/L (ref 0–44)
AST: 20 IU/L (ref 0–40)
Albumin/Globulin Ratio: 1.5 (ref 1.2–2.2)
Albumin: 4.4 g/dL (ref 3.8–4.8)
Alkaline Phosphatase: 74 IU/L (ref 44–121)
BUN/Creatinine Ratio: 23 (ref 10–24)
BUN: 22 mg/dL (ref 8–27)
Bilirubin Total: 0.8 mg/dL (ref 0.0–1.2)
CO2: 25 mmol/L (ref 20–29)
Calcium: 9.4 mg/dL (ref 8.6–10.2)
Chloride: 103 mmol/L (ref 96–106)
Creatinine, Ser: 0.97 mg/dL (ref 0.76–1.27)
Globulin, Total: 2.9 g/dL (ref 1.5–4.5)
Glucose: 106 mg/dL — ABNORMAL HIGH (ref 70–99)
Potassium: 4.9 mmol/L (ref 3.5–5.2)
Sodium: 142 mmol/L (ref 134–144)
Total Protein: 7.3 g/dL (ref 6.0–8.5)
eGFR: 86 mL/min/{1.73_m2} (ref >59)

## 2021-04-06 LAB — LIPID PANEL
Chol/HDL Ratio: 3.2 ratio (ref 0.0–5.0)
Cholesterol, Total: 169 mg/dL (ref 100–199)
HDL: 53 mg/dL (ref >39)
LDL Chol Calc (NIH): 100 mg/dL — ABNORMAL HIGH (ref 0–99)
Triglycerides: 84 mg/dL (ref 0–149)
VLDL Cholesterol Cal: 16 mg/dL (ref 5–40)

## 2021-04-06 MED ORDER — EZETIMIBE 10 MG PO TABS: 10.0000 mg | ORAL_TABLET | Freq: Every day | ORAL | 11 refills | Status: DC

## 2021-04-06 NOTE — Telephone Encounter (Addendum)
Results called to patient who verbalizes understanding! Patient agreeable to labs and trying Zetia 10mg . Medications ordered to preferred pharmacy and lab slips placed in the mail!         ----- Message from Loel Dubonnet, NP sent at 04/06/2021 10:36 AM EST ----- Normal kidneys, liver, electrolytes.  LDL of 100 not at goal of <70. Please ensure taking Crestor 40mg  daily. Recommend addition of Zetia 10mg  daily with repeat FLP/LFT in 8 weeks.

## 2021-04-20 ENCOUNTER — Encounter (HOSPITAL_BASED_OUTPATIENT_CLINIC_OR_DEPARTMENT_OTHER): Payer: Self-pay

## 2021-04-20 ENCOUNTER — Telehealth (HOSPITAL_BASED_OUTPATIENT_CLINIC_OR_DEPARTMENT_OTHER): Payer: Self-pay

## 2021-04-20 NOTE — Telephone Encounter (Signed)
Patient CT was never scheduled. Working with scheduling team now. Once we have a date RN to order labs and send new instructions to patient!

## 2021-04-25 ENCOUNTER — Ambulatory Visit (HOSPITAL_BASED_OUTPATIENT_CLINIC_OR_DEPARTMENT_OTHER): Payer: Medicare Other | Admitting: Family

## 2021-04-29 ENCOUNTER — Telehealth (HOSPITAL_COMMUNITY): Payer: Self-pay | Admitting: *Deleted

## 2021-04-29 NOTE — Telephone Encounter (Signed)
Reaching out to patient to offer assistance regarding upcoming cardiac imaging study; pt verbalizes understanding of appt date/time, parking situation and where to check in, pre-test NPO status and medications ordered, and verified current allergies; name and call back number provided for further questions should they arise  Gordy Clement RN Navigator Cardiac Imaging [facility] [phone] office [phone] cell  Patient to take 25mg  metoprolol tartrate two hours prior to his cardiac CT scan. He is aware to arrive at 11am for his 11:30am scan.

## 2021-05-02 ENCOUNTER — Ambulatory Visit (HOSPITAL_COMMUNITY)
Admission: RE | Admit: 2021-05-02 | Discharge: 2021-05-02 | Disposition: A | Discharge status: Home / Self Care | Payer: Medicare Other | Source: Ambulatory Visit | Attending: Family | Admitting: Family

## 2021-05-02 ENCOUNTER — Other Ambulatory Visit: Payer: Self-pay

## 2021-05-02 ENCOUNTER — Telehealth (HOSPITAL_BASED_OUTPATIENT_CLINIC_OR_DEPARTMENT_OTHER): Payer: Self-pay

## 2021-05-02 DIAGNOSIS — I77810 Thoracic aortic ectasia: Secondary | ICD-10-CM | POA: Diagnosis not present

## 2021-05-02 DIAGNOSIS — I2510 Atherosclerotic heart disease of native coronary artery without angina pectoris: Secondary | ICD-10-CM | POA: Insufficient documentation

## 2021-05-02 DIAGNOSIS — R931 Abnormal findings on diagnostic imaging of heart and coronary circulation: Secondary | ICD-10-CM | POA: Insufficient documentation

## 2021-05-02 MED ORDER — IOHEXOL 350 MG/ML SOLN
100.0000 mL | Freq: Once | INTRAVENOUS | Status: AC | PRN
  Administered 2021-05-02: 100 mL via INTRAVENOUS

## 2021-05-02 MED ORDER — NITROGLYCERIN 0.4 MG SL SUBL
SUBLINGUAL_TABLET | SUBLINGUAL | Status: AC
  Filled 2021-05-02: qty 2

## 2021-05-02 MED ORDER — NITROGLYCERIN 0.4 MG SL SUBL
0.8000 mg | SUBLINGUAL_TABLET | Freq: Once | SUBLINGUAL | Status: AC
  Administered 2021-05-02: 0.8 mg via SUBLINGUAL

## 2021-05-02 NOTE — Telephone Encounter (Signed)
Results released to mychart and orders placed

## 2021-05-10 ENCOUNTER — Encounter (HOSPITAL_BASED_OUTPATIENT_CLINIC_OR_DEPARTMENT_OTHER): Payer: Self-pay | Admitting: Family

## 2021-05-10 ENCOUNTER — Ambulatory Visit (INDEPENDENT_AMBULATORY_CARE_PROVIDER_SITE_OTHER): Payer: Medicare Other | Admitting: Family

## 2021-05-10 ENCOUNTER — Other Ambulatory Visit: Payer: Self-pay

## 2021-05-10 VITALS — BP 124/76 | HR 69 | Ht 73.0 in | Wt 190.8 lb

## 2021-05-10 DIAGNOSIS — I471 Supraventricular tachycardia: Secondary | ICD-10-CM

## 2021-05-10 DIAGNOSIS — E785 Hyperlipidemia, unspecified: Secondary | ICD-10-CM

## 2021-05-10 DIAGNOSIS — G4733 Obstructive sleep apnea (adult) (pediatric): Secondary | ICD-10-CM

## 2021-05-10 DIAGNOSIS — I25118 Atherosclerotic heart disease of native coronary artery with other forms of angina pectoris: Secondary | ICD-10-CM

## 2021-05-10 NOTE — Progress Notes (Signed)
Office Visit    Patient Name: Steven Byrd Date of Encounter: 05/10/2021  PCP:  Dorothyann Peng, NP   [place]  Cardiologist:  Pixie Casino, MD  Advanced Practice Provider:  No care team member to display Electrophysiologist:  None      Chief Complaint    Steven Byrd is a 66 y.o. male with a hx of coronary artery disease, hyperlipidemia, atrial flutter s/p ablation presents today for follow up after cardiac CTA  Past Medical History    Past Medical History:  Diagnosis Date   AAA (abdominal aortic aneurysm) 2020   3.2 cm in 2020    Atrial flutter ([place])    Dyslipidemia    OSA on CPAP 01/20/2014   Paroxysmal atrial fibrillation ([place])    Past Surgical History:  Procedure Laterality Date   A-FLUTTER ABLATION N/A 03/22/2017   Procedure: A-FLUTTER ABLATION;  Surgeon: Thompson Grayer, MD;  Location: [facility];  Service: Cardiovascular;  Laterality: N/A;   CRANIOTOMY Right 09/04/2017   Procedure: CRANIOTOMY FOR SUBDURAL HEMATOMA;  Surgeon: Erline Levine, MD;  Location: [facility];  Service: Neurosurgery;  Laterality: Right;    Allergies  Allergies  Allergen Reactions   Ativan [Lorazepam]     irritable and agitated    History of Present Illness    Steven Byrd is a 66 y.o. male with a hx of coronary artery disease, hyperlipidemia, atrial flutter s/p ablation 03/12/17, OSA last seen 02/22/2021  He had ED visit 10/2020 due to palpitations. His heart rate was 160-170 bpm in setting of playing golf, not using CPAP, alcohol the evening before, and increased caffeine. The arrhythmia was not able to be documented by EKG in the ED. Presumed episode of SVT. He was provided PRN Diltiazem.   I requested coronary calcium score as multiple friends had cardiac events.  Coronary calcium score was 1517 placing him in the 94th percentile for age sex matched control.  He was seen 02/2021 to review and we recommended cardiac CTA for further  clarification of coronary disease.  He was without anginal symptoms.  Cardiac CTA performed 05/02/2021 showed coronary calcium score 1461 placing him in the 93rd percentile.  He had minimal nonobstructive disease (1-24%) in the LAD, Cx, RCA.  Also noted to have mild thoracic aortic dilation of 3.9 cm recommended for repeat imaging in 1 year.  He presents today for follow-up with his wife.  Reviewed cardiac CTA in detail.  He is reassured by the result.  Reports no shortness of breath nor dyspnea on exertion. Reports no chest pain, pressure, or tightness. No edema, orthopnea, PND. Reports no palpitations.  Stays active hiking, strength training at the gym, as well as spending time with grandchildren [place], [place], [place], [place].  EKGs/Labs/Other Studies Reviewed:   The following studies were reviewed today:  Cardiac CTA 05/02/21 FINDINGS: Scan was triggered in the descending thoracic aorta. Axial non-contrast 3 mm slices were carried out through the heart. The data set was analyzed on a dedicated work station and scored using the [place]. Gantry rotation speed was 250 msecs and collimation was .6 mm. 0.8 mg of sl NTG was given. The 3D data set was reconstructed in 5% intervals of the 67-82 % of the R-R cycle. Diastolic phases were analyzed on a dedicated work station using MPR, MIP and VRT modes. The patient received 100 cc of contrast.   Aorta: 40 mm ascending aorta on double oblique assessment (mildly enlarged).  Main Pulmonary Artery: Normal size of the pulmonary artery.   Aortic Valve:  Tri-leaflet.  No calcifications.   Coronary Arteries:  Normal coronary origin.  Right dominance.   Coronary Calcium Score:   Left main: 0   Left anterior descending artery: 389   Left circumflex artery: 79   Right coronary artery: 993   Ramus intermedius artery: 0   Total: 1461   Percentile: 93rd for age, sex, and race matched control.   RCA is a large right dominant  artery that gives rise to PDA and PLA. There is minimal calcified plaque throughout the proximal, mid, distal vessel and PDA.   Left main is a large artery that gives rise to LAD and LCX arteries. There is no significant plaque.   LAD is a large vessel that gives rise to one large D1 Branch. There is minimal calcified plaque in the proximal and mid LAD. Minimal calcified plaque in the D1.   LCX is a non-dominant artery that gives rise to one large OM1 branch. There is minimal calcified plaque in the proximal LCX.   There is a ramus intermedius vessel with minimal soft plaque.   Other findings:   Normal pulmonary vein drainage into the left atrium.   Large left atrial appendage without a thrombus.   Extra-cardiac findings: See attached radiology report for non-cardiac structures.   Body motion artifact.   IMPRESSION: 1. Coronary calcium was 93rd percentile for age, sex, and race matched control, similar to 2022 coronary artery calcium score.   2. Normal coronary origin with right dominance.   3. CAD-RADS 1. Minimal non-obstructive CAD (1-24%). Consider non-atherosclerotic causes of chest pain. Consider preventive therapy and risk factor modification.   4. Mild thoracic aortic dilation. Consider one year secondary imaging modality (echocardiogram, CTA Aorta Protocol, MRA Aorta Protocol) for screening. Coronary calcium scoring FINDINGS: Coronary Calcium Score:   Left main: 0   Left anterior descending artery: 394   Left circumflex artery: 98   Right coronary artery: 1024   Total: 1517   Percentile: 94th   Pericardium: Normal.   Non-cardiac: See separate report from [facility].   IMPRESSION: Coronary calcium score of 1517. This was 94th percentile for age-, race-, and sex-matched controls.  EKG:  EKG is ordered today.  The ekg ordered today demonstrates SB 57 bpm with no acute ST/T wave changes.   Recent Labs: 10/14/2020: Magnesium  1.9 12/08/2020: Hemoglobin 14.0; Platelets 194.0; TSH 2.37 04/05/2021: ALT 22; BUN 22; Creatinine, Ser 0.97; Potassium 4.9; Sodium 142  Recent Lipid Panel    Component Value Date/Time   CHOL 169 04/05/2021 0936   TRIG 84 04/05/2021 0936   HDL 53 04/05/2021 0936   CHOLHDL 3.2 04/05/2021 0936   CHOLHDL 3 12/08/2020 0934   VLDL 13.6 12/08/2020 0934   LDLCALC 100 (H) 04/05/2021 0936   LDLCALC 84 11/25/2019 0835   LDLDIRECT 126.8 02/01/2010 0923    Home Medications   Current Meds  Medication Sig   acyclovir (ZOVIRAX) 800 MG tablet TAKE ONE TABLET BY MOUTH THREE TIMES A DAY AS NEEDED   aspirin EC 81 MG tablet Take 1 tablet (81 mg total) by mouth daily. Swallow whole.   CALCIUM-MAGNESIUM-ZINC PO Take 1 capsule by mouth daily.   cholecalciferol (VITAMIN D) 1000 units tablet Take 1,000 Units by mouth daily.   Coenzyme Q10 (CO Q 10 PO) Take 1 capsule by mouth daily.   cyclobenzaprine (FLEXERIL) 10 MG tablet TAKE ONE TABLET BY MOUTH THREE TIMES A DAY AS  NEEDED FOR MUSCLE SPASMS   diltiazem (CARDIZEM) 30 MG tablet Take 1 tablet every 4 hours AS NEEDED for heart rate >100   ezetimibe (ZETIA) 10 MG tablet Take 1 tablet (10 mg total) by mouth daily.   Multiple Vitamin (MULTIVITAMIN) capsule Take 1 capsule by mouth daily.   nitroGLYCERIN (NITROSTAT) 0.4 MG SL tablet Place 1 tablet (0.4 mg total) under the tongue every 5 (five) minutes as needed for chest pain (If you take 3 tablets without resolution of chest pain, proceed to ER for evaluation.).   rosuvastatin (CRESTOR) 40 MG tablet Take 1 tablet (40 mg total) by mouth daily.     Review of Systems      All other systems reviewed and are otherwise negative except as noted above.  Physical Exam    VS:  BP 124/76    Pulse 69    Ht '6\' 1"'$  (1.854 m)    Wt 190 lb 12.8 oz (86.5 kg)    SpO2 97%    BMI 25.17 kg/m  , BMI Body mass index is 25.17 kg/m.  Wt Readings from Last 3 Encounters:  05/10/21 190 lb 12.8 oz (86.5 kg)  02/22/21 183 lb (83 kg)   12/08/20 178 lb (80.7 kg)     GEN: Well nourished, well developed, in no acute distress. HEENT: normal. Neck: Supple, no JVD, carotid bruits, or masses. Cardiac: RRR, no murmurs, rubs, or gallops. No clubbing, cyanosis, edema.  Radials/PT 2+ and equal bilaterally.  Respiratory:  Respirations regular and unlabored, clear to auscultation bilaterally. GI: Soft, nontender, nondistended. MS: No deformity or atrophy. Skin: Warm and dry, no rash. Neuro:  Strength and sensation are intact. Psych: Normal affect.  Assessment & Plan    CAD -cardiac CTA 04/2021 with minimal nonobstructive coronary disease (1-24%).  Recommended for medical management.  Continue aspirin, rosuvastatin, Zetia, as needed nitroglycerin.  He is without anginal symptoms.  Heart healthy diet and regular cardiovascular exercise encouraged.    HLD, LDL goal <70 - 12/08/20 total cholesterol 158, HDL 55.8, LDL 88, triglycerides 68.  02/2021 lipoprotein a 36, NMR profile with LDL particle number 1139, LDL 85, LDL size 20.9. Zetia '10mg'$  QD was added. He has upcoming lipid panel first week of April. If LDL still above goal of 70 consider PCSK9i.  Paroxysmal atrial flutter - S/p ablation 2019. Episode of SVT 10/2020 in setting of caffeine, etoh, not using CPAP. Continue PRN Diltiazem. No recurrent palpitations. He has CHADS2VASc of 2 (>65, CAD) but without evidence of recurrent atrila flutter no indication for anticoagulation.   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  Repeat CT aorta 04/2022 for monitoring.  Continue optimal blood pressure control, rosuvastatin.   OSA - CPAP compliance encouraged.   Disposition: Follow up with Pixie Casino, MD. first available July 2023, patient prefers sooner appointment-will route message to Dr. Debara Pickett and his nurse for assistance.  Signed, Loel Dubonnet, NP 05/10/2021, 10:16 AM [place]

## 2021-05-10 NOTE — Patient Instructions (Signed)
Medication Instructions:  ?Continue your current medications.  ? ?*If you need a refill on your cardiac medications before your next appointment, please call your pharmacy* ? ? ?Lab Work: ?Your physician recommends that you return for lab work the first week of April for fasting lipid panel, CMP.  ? ?Please return for Lab work. You may come to the...  ? ?[facility] (3rd floor) ?[address] [place], [place]  ?Open: 8am-Noon and 1pm-4:30pm  ? ?[facility] ?[place]  ? ?[employer]- Any location ? ?**no appointments needed**  ? ?If you have labs (blood work) drawn today and your tests are completely normal, you will receive your results only by: ?MyChart Message (if you have MyChart) OR ?A paper copy in the mail ?If you have any lab test that is abnormal or we need to change your treatment, we will call you to review the results. ? ? ?Testing/Procedures: ?CT aorta 04/2022 for monitoring.  ? ? ?Follow-Up: ?At [facility], you and your health needs are our priority.  As part of our continuing mission to provide you with exceptional heart care, we have created designated Provider Care Teams.  These Care Teams include your primary Cardiologist (physician) and Advanced Practice Providers (APPs -  Physician Assistants and Nurse Practitioners) who all work together to provide you with the care you need, when you need it. ? ?We recommend signing up for the patient portal called "MyChart".  Sign up information is provided on this After Visit Summary.  MyChart is used to connect with patients for Virtual Visits (Telemedicine).  Patients are able to view lab/test results, encounter notes, upcoming appointments, etc.  Non-urgent messages can be sent to your provider as well.   ?To learn more about what you can do with MyChart, go to [email or web address].   ? ?Your next appointment:   ?Steven Dubonnet, NP will reach out to Dr. Debara Pickett about a follow up  appointment with him.   ? ? ?Other Instructions ? ?Heart Healthy Diet Recommendations: ?A low-salt diet is recommended. Meats should be grilled, baked, or boiled. Avoid fried foods. Focus on lean protein sources like fish or chicken with vegetables and fruits. The American Heart Association is a Microbiologist!  American Heart Association Diet and Lifeystyle Recommendations   ? ?Exercise recommendations: ?The American Heart Association recommends 150 minutes of moderate intensity exercise weekly. ?Try 30 minutes of moderate intensity exercise 4-5 times per week. ?This could include walking, jogging, or swimming. ? ?  ?

## 2021-05-25 ENCOUNTER — Encounter (HOSPITAL_BASED_OUTPATIENT_CLINIC_OR_DEPARTMENT_OTHER): Payer: Self-pay

## 2021-05-27 DIAGNOSIS — E785 Hyperlipidemia, unspecified: Secondary | ICD-10-CM | POA: Diagnosis not present

## 2021-05-28 LAB — HEPATIC FUNCTION PANEL
ALT: 34 IU/L (ref 0–44)
AST: 37 IU/L (ref 0–40)
Albumin: 4.5 g/dL (ref 3.8–4.8)
Alkaline Phosphatase: 82 IU/L (ref 44–121)
Bilirubin Total: 0.9 mg/dL (ref 0.0–1.2)
Bilirubin, Direct: 0.25 mg/dL (ref 0.00–0.40)
Total Protein: 7.2 g/dL (ref 6.0–8.5)

## 2021-05-28 LAB — LIPID PANEL
Chol/HDL Ratio: 2.2 ratio (ref 0.0–5.0)
Cholesterol, Total: 138 mg/dL (ref 100–199)
HDL: 64 mg/dL (ref >39)
LDL Chol Calc (NIH): 63 mg/dL (ref 0–99)
Triglycerides: 49 mg/dL (ref 0–149)
VLDL Cholesterol Cal: 11 mg/dL (ref 5–40)

## 2021-06-03 ENCOUNTER — Encounter (HOSPITAL_BASED_OUTPATIENT_CLINIC_OR_DEPARTMENT_OTHER): Payer: Self-pay

## 2021-06-27 DIAGNOSIS — Z23 Encounter for immunization: Secondary | ICD-10-CM | POA: Diagnosis not present

## 2021-07-22 ENCOUNTER — Ambulatory Visit (INDEPENDENT_AMBULATORY_CARE_PROVIDER_SITE_OTHER): Payer: Medicare Other

## 2021-07-22 VITALS — Ht 72.0 in | Wt 180.0 lb

## 2021-07-22 DIAGNOSIS — Z0000 Encounter for general adult medical examination without abnormal findings: Secondary | ICD-10-CM | POA: Diagnosis not present

## 2021-07-22 NOTE — Progress Notes (Signed)
Subjective:   Steven Byrd is a 66 y.o. male who presents for Medicare Annual/Subsequent preventive examination.  Review of Systems    Virtual Visit via Telephone Note  I connected with  EDWEN MCLESTER on 07/22/21 at  3:15 PM EDT by telephone and verified that I am speaking with the correct person using two identifiers.  Location: Patient: Home Provider: Office Persons participating in the virtual visit: patient/Nurse Health Advisor   I discussed the limitations, risks, security and privacy concerns of performing an evaluation and management service by telephone and the availability of in person appointments. The patient expressed understanding and agreed to proceed.  Interactive audio and video telecommunications were attempted between this nurse and patient, however failed, due to patient having technical difficulties OR patient did not have access to video capability.  We continued and completed visit with audio only.  Some vital signs may be absent or patient reported.   Criselda Peaches, LPN  Cardiac Risk Factors include: advanced age (>96mn, >>54women);male gender     Objective:    Today's Vitals   07/22/21 1518  Weight: 180 lb (81.6 kg)  Height: 6' (1.829 m)   Body mass index is 24.41 kg/m.     07/22/2021    3:29 PM 07/21/2020   11:15 AM 04/09/2020    3:56 PM 09/23/2019    2:08 PM 05/23/2019    9:03 AM 03/22/2017    6:38 AM 12/26/2013    9:44 PM  Advanced Directives  Does Patient Have a Medical Advance Directive? Yes Yes Yes Yes Yes Yes No  Type of AParamedicof [place] will Healthcare Power of ABoy[place]Living will HC[place]Living will;Out of facility DNR (pink MOST or yellow form) HVer[place]Living will HF[place] ChaseLiving will   Does patient want to make changes to medical advance directive? No - Patient declined     No - Patient declined   Copy of HAnchor Pointin Chart? No - copy requested No - copy requested    Yes   Would patient like information on creating a medical advance directive?       No - patient declined information    Current Medications (verified) Outpatient Encounter Medications as of 07/22/2021  Medication Sig   acyclovir (ZOVIRAX) 800 MG tablet TAKE ONE TABLET BY MOUTH THREE TIMES A DAY AS NEEDED   aspirin EC 81 MG tablet Take 1 tablet (81 mg total) by mouth daily. Swallow whole.   CALCIUM-MAGNESIUM-ZINC PO Take 1 capsule by mouth daily.   cholecalciferol (VITAMIN D) 1000 units tablet Take 1,000 Units by mouth daily.   Coenzyme Q10 (CO Q 10 PO) Take 1 capsule by mouth daily.   cyclobenzaprine (FLEXERIL) 10 MG tablet TAKE ONE TABLET BY MOUTH THREE TIMES A DAY AS NEEDED FOR MUSCLE SPASMS   diltiazem (CARDIZEM) 30 MG tablet Take 1 tablet every 4 hours AS NEEDED for heart rate >100   ezetimibe (ZETIA) 10 MG tablet Take 1 tablet (10 mg total) by mouth daily.   Multiple Vitamin (MULTIVITAMIN) capsule Take 1 capsule by mouth daily.   nitroGLYCERIN (NITROSTAT) 0.4 MG SL tablet Place 1 tablet (0.4 mg total) under the tongue every 5 (five) minutes as needed for chest pain (If you take 3 tablets without resolution of chest pain, proceed to ER for evaluation.).   rosuvastatin (CRESTOR) 40 MG tablet Take 1 tablet (40 mg total) by mouth daily.   No facility-administered  encounter medications on file as of 07/22/2021.    Allergies (verified) Ativan [lorazepam]   History: Past Medical History:  Diagnosis Date   AAA (abdominal aortic aneurysm) ([place]) 2020   3.2 cm in 2020    Atrial flutter (HCC)    Dyslipidemia    OSA on CPAP 01/20/2014   Paroxysmal atrial fibrillation ([place])    Past Surgical History:  Procedure Laterality Date   A-FLUTTER ABLATION N/A 03/22/2017   Procedure: A-FLUTTER ABLATION;  Surgeon: Thompson Grayer, MD;  Location: [facility];  Service: Cardiovascular;  Laterality: N/A;   CRANIOTOMY Right 09/04/2017    Procedure: CRANIOTOMY FOR SUBDURAL HEMATOMA;  Surgeon: Erline Levine, MD;  Location: [facility];  Service: Neurosurgery;  Laterality: Right;   Family History  Problem Relation Age of Onset   Diabetes Father    Stroke Father    Hypertension Mother        ?   Heart attack Maternal Grandfather    Colon cancer Neg Hx    Social History   Socioeconomic History   Marital status: Divorced    Spouse name: Not on file   Number of children: Not on file   Years of education: Not on file   Highest education level: Not on file  Occupational History   Not on file  Tobacco Use   Smoking status: Former    Types: Cigarettes    Quit date: 10/16/1998    Years since quitting: 22.7   Smokeless tobacco: Never  Vaping Use   Vaping Use: Never used  Substance and Sexual Activity   Alcohol use: Yes    Alcohol/week: 14.0 - 21.0 standard drinks    Types: 14 - 21 Glasses of wine per week    Comment: red wine   Drug use: No   Sexual activity: Yes    Partners: Female  Other Topics Concern   Not on file  Social History Narrative   Has a Licensed conveyancer company since 1978      Daughter who is 33 and a Son who is 39   - Daughter in [place], Son is in [place].       He likes to hike, scuba dive, and travel      Lives in single story home    [name] - significant other   4 year degree   Right handed    Social Determinants of Health   Financial Resource Strain: Low Risk    Difficulty of Paying Living Expenses: Not hard at all  Food Insecurity: No Food Insecurity   Worried About Charity fundraiser in the Last Year: Never true   [place] in the Last Year: Never true  Transportation Needs: No Transportation Needs   Lack of Transportation (Medical): No   Lack of Transportation (Non-Medical): No  Physical Activity: Sufficiently Active   Days of Exercise per Week: 5 days   Minutes of Exercise per Session: 60 min  Stress: No Stress Concern Present   Feeling of Stress : Not at all  Social  Connections: Moderately Integrated   Frequency of Communication with Friends and Family: More than three times a week   Frequency of Social Gatherings with Friends and Family: More than three times a week   Attends Religious Services: More than 4 times per year   Active Member of [employer] or Organizations: Yes   Attends Music therapist: More than 4 times per year   Marital Status: Divorced  Clinical Intake:   Diabetic?  No    Activities of Daily Living    07/22/2021    3:27 PM 12/08/2020    8:40 AM  In your present state of health, do you have any difficulty performing the following activities:  Hearing? 0 0  Vision? 0 0  Difficulty concentrating or making decisions? 0 0  Walking or climbing stairs? 0 0  Dressing or bathing? 0 0  Doing errands, shopping? 0 0  Preparing Food and eating ? N   Using the Toilet? N   In the past six months, have you accidently leaked urine? N   Do you have problems with loss of bowel control? N   Managing your Medications? N   Managing your Finances? N   Housekeeping or managing your Housekeeping? N     Patient Care Team: Dorothyann Peng, NP as PCP - General (Family Medicine) Debara Pickett Nadean Corwin, MD as PCP - Cardiology (Cardiology) Cameron Sprang, MD as Consulting Physician (Neurology)  Indicate any recent Medical Services you may have received from other than [facility] providers in the past year (date may be approximate).     Assessment:   This is a routine wellness examination for [place].  Hearing/Vision screen Hearing Screening - Comments:: No hearing difficulty Vision Screening - Comments:: Wears glasses. Followed by Dr Herbert Deaner  Dietary issues and exercise activities discussed: Exercise limited by: None identified   Goals Addressed               This Visit's Progress     Patient stated (pt-stated)        Travel more and visit kids and grandchildren.       Depression Screen    07/22/2021    3:24 PM 07/21/2020    11:15 AM 07/26/2017    2:32 PM  PHQ 2/9 Scores  PHQ - 2 Score 0 0 0    Fall Risk    07/22/2021    3:28 PM 07/21/2020   11:17 AM 04/09/2020    3:57 PM 09/23/2019    2:08 PM 05/23/2019    9:00 AM  [place] in the past year? 0 1 0 0 0  Number falls in past yr: 0 1 0 0 0  Injury with Fall? 0 1 0 0 0  Comment  fell off ladder scrapes     Risk for fall due to : No Fall Risks Impaired vision     Follow up  Falls prevention discussed       FALL RISK PREVENTION PERTAINING TO THE HOME:  Any stairs in or around the home? Yes  If so, are there any without handrails? No  Home free of loose throw rugs in walkways, pet beds, electrical cords, etc? Yes  Adequate lighting in your home to reduce risk of falls? Yes   ASSISTIVE DEVICES UTILIZED TO PREVENT FALLS:  Life alert? No  Use of a cane, walker or w/c? No  Grab bars in the bathroom? Yes  Shower chair or bench in shower? No  Elevated toilet seat or a handicapped toilet? No   TIMED UP AND GO:  Was the test performed? No . Audio Visit  Cognitive Function:      10/23/2018   10:00 AM  Montreal Cognitive Assessment   Visuospatial/ Executive (0/5) 5  Naming (0/3) 3  Attention: Read list of digits (0/2) 2  Attention: Read list of letters (0/1) 1  Attention: Serial 7 subtraction starting at 100 (0/3) 3  Language: Repeat phrase (0/2) 2  Language : Fluency (0/1) 1  Abstraction (0/2) 2  Delayed Recall (0/5) 4  Orientation (0/6) 6  Total 29      07/22/2021    3:29 PM 07/21/2020   11:19 AM  6CIT Screen  What Year? 0 points 0 points  What month? 0 points 0 points  What time? 0 points   Count back from 20 0 points 0 points  Months in reverse 0 points 0 points  Repeat phrase 0 points 0 points  Total Score 0 points     Immunizations Immunization History  Administered Date(s) Administered   Fluad Quad(high Dose 65+) 11/06/2018, 11/25/2019   Hepatitis A, Adult 11/27/2014   Influenza Inj Mdck Quad Pf 01/01/2017    Influenza Split 01/18/2012, 01/17/2013   Influenza Whole 01/12/2009, 02/08/2010   Influenza,inj,Quad PF,6+ Mos 11/10/2014, 12/29/2015, 12/16/2017   Influenza-Unspecified 01/07/2014   PFIZER Comirnaty(Gray Top)Covid-19 Tri-Sucrose Vaccine 04/12/2019, 05/03/2019, 12/02/2019, 06/04/2020   Pneumococcal Conjugate-13 11/06/2018   Td 03/07/1999, 02/08/2010   Tdap 11/25/2019   Typhoid Inactivated 11/27/2014   Zoster Recombinat (Shingrix) 04/28/2018, 09/15/2018    TDAP status: Up to date  Flu Vaccine status: Due, Education has been provided regarding the importance of this vaccine. Advised may receive this vaccine at local pharmacy or Health Dept. Aware to provide a copy of the vaccination record if obtained from local pharmacy or Health Dept. Verbalized acceptance and understanding.  Pneumococcal vaccine status: Up to date  Covid-19 vaccine status: Completed vaccines  Qualifies for Shingles Vaccine? Yes   Zostavax completed Yes   Shingrix Completed?: Yes  Screening Tests Health Maintenance  Topic Date Due   Pneumonia Vaccine 36+ Years old (2 - PPSV23 if available, else PCV20) 07/23/2022 (Originally 11/06/2019)   INFLUENZA VACCINE  10/04/2021   COLONOSCOPY (Pts 45-40yr Insurance coverage will need to be confirmed)  11/13/2023   TETANUS/TDAP  11/24/2029   Hepatitis C Screening  Completed   Zoster Vaccines- Shingrix  Completed   HPV VACCINES  Aged Out   COVID-19 Vaccine  Discontinued    Health Maintenance  There are no preventive care reminders to display for this patient.   Colorectal cancer screening: Type of screening: Colonoscopy. Completed 11/12/13. Repeat every 10 years  Lung Cancer Screening: (Low Dose CT Chest recommended if Age 584-80years, 30 pack-year currently smoking OR have quit w/in 15years.) does not qualify.     Additional Screening:  Hepatitis C Screening: does qualify; Completed 07/26/17  Vision Screening: Recommended annual ophthalmology exams for early  detection of glaucoma and other disorders of the eye. Is the patient up to date with their annual eye exam?  Yes  Who is the provider or what is the name of the office in which the patient attends annual eye exams? Dr HHerbert DeanerIf pt is not established with a provider, would they like to be referred to a provider to establish care? No .   Dental Screening: Recommended annual dental exams for proper oral hygiene  Community Resource Referral / Chronic Care Management:  CRR required this visit?  No   CCM required this visit?  No      Plan:     I have personally reviewed and noted the following in the patient's chart:   Medical and social history Use of alcohol, tobacco or illicit drugs  Current medications and supplements including opioid prescriptions. Patient is not currently taking opioid prescriptions. Functional ability and status Nutritional status Physical activity Advanced directives List of other physicians Hospitalizations,  surgeries, and ER visits in previous 12 months Vitals Screenings to include cognitive, depression, and falls Referrals and appointments  In addition, I have reviewed and discussed with patient certain preventive protocols, quality metrics, and best practice recommendations. A written personalized care plan for preventive services as well as general preventive health recommendations were provided to patient.     Criselda Peaches, LPN   06/30/8339   Nurse Notes: None

## 2021-07-22 NOTE — Patient Instructions (Addendum)
Mr. Steven Byrd , Thank you for taking time to come for your Medicare Wellness Visit. I appreciate your ongoing commitment to your health goals. Please review the following plan we discussed and let me know if I can assist you in the future.   These are the goals we discussed:  Goals       Patient Stated      None at this time      Patient stated (pt-stated)      Travel more and visit kids and grandchildren.        This is a list of the screening recommended for you and due dates:  Health Maintenance  Topic Date Due   Pneumonia Vaccine (2 - PPSV23 if available, else PCV20) 07/23/2022*   Flu Shot  10/04/2021   Colon Cancer Screening  11/13/2023   Tetanus Vaccine  11/24/2029   Hepatitis C Screening: USPSTF Recommendation to screen - Ages 18-79 yo.  Completed   Zoster (Shingles) Vaccine  Completed   HPV Vaccine  Aged Out   COVID-19 Vaccine  Discontinued  *Topic was postponed. The date shown is not the original due date.   Advanced directives: Yes   Conditions/risks identified: None  Next appointment: Follow up in one year for your annual wellness visit.    Preventive Care 76 Years and Older, Male Preventive care refers to lifestyle choices and visits with your health care provider that can promote health and wellness. What does preventive care include? A yearly physical exam. This is also called an annual well check. Dental exams once or twice a year. Routine eye exams. Ask your health care provider how often you should have your eyes checked. Personal lifestyle choices, including: Daily care of your teeth and gums. Regular physical activity. Eating a healthy diet. Avoiding tobacco and drug use. Limiting alcohol use. Practicing safe sex. Taking low doses of aspirin every day. Taking vitamin and mineral supplements as recommended by your health care provider. What happens during an annual well check? The services and screenings done by your health care provider during  your annual well check will depend on your age, overall health, lifestyle risk factors, and family history of disease. Counseling  Your health care provider may ask you questions about your: Alcohol use. Tobacco use. Drug use. Emotional well-being. Home and relationship well-being. Sexual activity. Eating habits. History of falls. Memory and ability to understand (cognition). Work and work Statistician. Screening  You may have the following tests or measurements: Height, weight, and BMI. Blood pressure. Lipid and cholesterol levels. These may be checked every 5 years, or more frequently if you are over 61 years old. Skin check. Lung cancer screening. You may have this screening every year starting at age 62 if you have a 30-pack-year history of smoking and currently smoke or have quit within the past 15 years. Fecal occult blood test (FOBT) of the stool. You may have this test every year starting at age 32. Flexible sigmoidoscopy or colonoscopy. You may have a sigmoidoscopy every 5 years or a colonoscopy every 10 years starting at age 35. Prostate cancer screening. Recommendations will vary depending on your family history and other risks. Hepatitis C blood test. Hepatitis B blood test. Sexually transmitted disease (STD) testing. Diabetes screening. This is done by checking your blood sugar (glucose) after you have not eaten for a while (fasting). You may have this done every 1-3 years. Abdominal aortic aneurysm (AAA) screening. You may need this if you are a current or former smoker.  Osteoporosis. You may be screened starting at age 64 if you are at high risk. Talk with your health care provider about your test results, treatment options, and if necessary, the need for more tests. Vaccines  Your health care provider may recommend certain vaccines, such as: Influenza vaccine. This is recommended every year. Tetanus, diphtheria, and acellular pertussis (Tdap, Td) vaccine. You may need  a Td booster every 10 years. Zoster vaccine. You may need this after age 5. Pneumococcal 13-valent conjugate (PCV13) vaccine. One dose is recommended after age 45. Pneumococcal polysaccharide (PPSV23) vaccine. One dose is recommended after age 62. Talk to your health care provider about which screenings and vaccines you need and how often you need them. This information is not intended to replace advice given to you by your health care provider. Make sure you discuss any questions you have with your health care provider. Document Released: 03/19/2015 Document Revised: 11/10/2015 Document Reviewed: 12/22/2014 Elsevier Interactive Patient Education  2017 [employer] Prevention in the Home Falls can cause injuries. They can happen to people of all ages. There are many things you can do to make your home safe and to help prevent falls. What can I do on the outside of my home? Regularly fix the edges of walkways and driveways and fix any cracks. Remove anything that might make you trip as you walk through a door, such as a raised step or threshold. Trim any bushes or trees on the path to your home. Use bright outdoor lighting. Clear any walking paths of anything that might make someone trip, such as rocks or tools. Regularly check to see if handrails are loose or broken. Make sure that both sides of any steps have handrails. Any raised decks and porches should have guardrails on the edges. Have any leaves, snow, or ice cleared regularly. Use sand or salt on walking paths during winter. Clean up any spills in your garage right away. This includes oil or grease spills. What can I do in the bathroom? Use night lights. Install grab bars by the toilet and in the tub and shower. Do not use towel bars as grab bars. Use non-skid mats or decals in the tub or shower. If you need to sit down in the shower, use a plastic, non-slip stool. Keep the floor dry. Clean up any water that spills on the  floor as soon as it happens. Remove soap buildup in the tub or shower regularly. Attach bath mats securely with double-sided non-slip rug tape. Do not have throw rugs and other things on the floor that can make you trip. What can I do in the bedroom? Use night lights. Make sure that you have a light by your bed that is easy to reach. Do not use any sheets or blankets that are too big for your bed. They should not hang down onto the floor. Have a firm chair that has side arms. You can use this for support while you get dressed. Do not have throw rugs and other things on the floor that can make you trip. What can I do in the kitchen? Clean up any spills right away. Avoid walking on wet floors. Keep items that you use a lot in easy-to-reach places. If you need to reach something above you, use a strong step stool that has a grab bar. Keep electrical cords out of the way. Do not use floor polish or wax that makes floors slippery. If you must use wax, use non-skid floor  wax. Do not have throw rugs and other things on the floor that can make you trip. What can I do with my stairs? Do not leave any items on the stairs. Make sure that there are handrails on both sides of the stairs and use them. Fix handrails that are broken or loose. Make sure that handrails are as long as the stairways. Check any carpeting to make sure that it is firmly attached to the stairs. Fix any carpet that is loose or worn. Avoid having throw rugs at the top or bottom of the stairs. If you do have throw rugs, attach them to the floor with carpet tape. Make sure that you have a light switch at the top of the stairs and the bottom of the stairs. If you do not have them, ask someone to add them for you. What else can I do to help prevent falls? Wear shoes that: Do not have high heels. Have rubber bottoms. Are comfortable and fit you well. Are closed at the toe. Do not wear sandals. If you use a stepladder: Make sure that  it is fully opened. Do not climb a closed stepladder. Make sure that both sides of the stepladder are locked into place. Ask someone to hold it for you, if possible. Clearly mark and make sure that you can see: Any grab bars or handrails. First and last steps. Where the edge of each step is. Use tools that help you move around (mobility aids) if they are needed. These include: Canes. Walkers. Scooters. Crutches. Turn on the lights when you go into a dark area. Replace any light bulbs as soon as they burn out. Set up your furniture so you have a clear path. Avoid moving your furniture around. If any of your floors are uneven, fix them. If there are any pets around you, be aware of where they are. Review your medicines with your doctor. Some medicines can make you feel dizzy. This can increase your chance of falling. Ask your doctor what other things that you can do to help prevent falls. This information is not intended to replace advice given to you by your health care provider. Make sure you discuss any questions you have with your health care provider. Document Released: 12/17/2008 Document Revised: 07/29/2015 Document Reviewed: 03/27/2014 Elsevier Interactive Patient Education  2017 [employer].

## 2021-07-26 ENCOUNTER — Ambulatory Visit (INDEPENDENT_AMBULATORY_CARE_PROVIDER_SITE_OTHER): Payer: Medicare Other | Admitting: Internal Medicine

## 2021-07-26 ENCOUNTER — Telehealth: Payer: Self-pay | Admitting: Internal Medicine

## 2021-07-26 ENCOUNTER — Encounter: Payer: Self-pay | Admitting: Internal Medicine

## 2021-07-26 VITALS — BP 108/72 | HR 65 | Ht 72.0 in | Wt 185.2 lb

## 2021-07-26 DIAGNOSIS — E7849 Other hyperlipidemia: Secondary | ICD-10-CM

## 2021-07-26 DIAGNOSIS — Z8679 Personal history of other diseases of the circulatory system: Secondary | ICD-10-CM | POA: Diagnosis not present

## 2021-07-26 DIAGNOSIS — E785 Hyperlipidemia, unspecified: Secondary | ICD-10-CM

## 2021-07-26 DIAGNOSIS — E7801 Familial hypercholesterolemia: Secondary | ICD-10-CM | POA: Diagnosis not present

## 2021-07-26 DIAGNOSIS — I77819 Aortic ectasia, unspecified site: Secondary | ICD-10-CM | POA: Diagnosis not present

## 2021-07-26 DIAGNOSIS — Z9889 Other specified postprocedural states: Secondary | ICD-10-CM | POA: Diagnosis not present

## 2021-07-26 DIAGNOSIS — I2510 Atherosclerotic heart disease of native coronary artery without angina pectoris: Secondary | ICD-10-CM | POA: Diagnosis not present

## 2021-07-26 NOTE — Telephone Encounter (Signed)
Genetic test for dyslipidemia/ASCVD panel ordered ([facility]) Cheek swab completed in office Specimen and necessary paperwork mailed. ID: [id number]

## 2021-07-26 NOTE — Patient Instructions (Signed)
Medication Instructions:  NO CHANGES  *If you need a refill on your cardiac medications before your next appointment, please call your pharmacy*   Lab Work: FASTING lab work to check cholesterol in about 4 months  If you have labs (blood work) drawn today and your tests are completely normal, you will receive your results only by: [place] (if you have MyChart) OR A paper copy in the mail If you have any lab test that is abnormal or we need to change your treatment, we will call you to review the results.   Testing/Procedures: NONE   Follow-Up: At [facility], you and your health needs are our priority.  As part of our continuing mission to provide you with exceptional heart care, we have created designated Provider Care Teams.  These Care Teams include your primary Cardiologist (physician) and Advanced Practice Providers (APPs -  Physician Assistants and Nurse Practitioners) who all work together to provide you with the care you need, when you need it.  We recommend signing up for the patient portal called "MyChart".  Sign up information is provided on this After Visit Summary.  MyChart is used to connect with patients for Virtual Visits (Telemedicine).  Patients are able to view lab/test results, encounter notes, upcoming appointments, etc.  Non-urgent messages can be sent to your provider as well.   To learn more about what you can do with MyChart, go to [email or web address].    Your next appointment:   4 month(s)  The format for your next appointment:   In Person  Provider:   Pixie Casino, MD [facility]

## 2021-07-26 NOTE — Progress Notes (Signed)
OFFICE NOTE  Chief Complaint:  Routine follow-up  Primary Care Physician: Dorothyann Peng, NP  HPI:  Steven Byrd is a 66 y.o. male with a past medical history significant for hyperlipidemia. He has been having recurrent palpitations over the past month, but worse in the past 2 days. He denies chest pain or dyspnea. He has been able to exercise without much difficulty, however, he noted his HR was higher than normal. Typically his resting HR is in the 50-60 range. He went to his PCP who referred him to the ER for an SVT. He was noted to be in 2:1 atrial flutter in the ER. He was given adenosine in the ER which caused a pause, but did not slow his rhythm. He was then given 15 mg IV cardizem and eventually converted back to sinus rhythm. His CHADSVASC score is 0. At discharge I recommended he stay on aspirin and start on long-acting Cardizem. He said that he wished to only take medication as needed. I warned him that he may have recurrent atrial flutter and affect did have a couple of episodes. He called the office and was eventually placed on Cardizem 120 mg 3 times a day. He's been taking this dose and has noted no recurrence of his atrial flutter. He had several questions today about management of atrial flutter and is pretty clearly is not what take medications for long periods of time. In addition he reports that in the past he's had an informal sleep study and underwent a uvulopalatoplasty, but still reports that he is told that he snores, stops breathing and may very well have apnea. Of course this may be a risk factor for his atrial flutter. He did undergo an echocardiogram which showed a normal EF, normal wall motion and normal diastolic function. Wall thickness is also normal. Chamber sizes are normal. There was a top normal descending aortic diameter. Otherwise agree benign echocardiogram. As mentioned in my hospital consult note, he is very athletic, exercises a number days a week  including long-distance running and cycling and he denies any anginal symptoms.  07/23/2017  Steven Byrd returns today for follow-up of atrial flutter.  He recently was evaluated by Dr. Rayann Heman and determined to be candidate for atrial flutter ablation.  He underwent that procedure in January.  This was successful and resolve the symptoms.  He is CHADSVASC score is 0 and he was instructed to come off of aspirin, diltiazem and wean metoprolol.  He is now off of all medications and doing well.  Blood pressures well controlled.  He denies any chest pain or shortness of breath or recurrent palpitations.  01/09/2018  Steven Byrd was seen today in routine follow-up.  Unfortunately he was recently hospitalized and had some paroxysmal atrial fibrillation.  This was related to a fall which involved a subdural hematoma.  He was placed on Cardizem but cannot be anticoagulated.  He followed up in the A. fib clinic and had been did not to have any recurrent A. fib.  He follows up with me today.  Overall he seems to be doing well.  He is rehabilitating and has follow-up with neurology.  PMHx:  Past Medical History:  Diagnosis Date   AAA (abdominal aortic aneurysm) ([place]) 2020   3.2 cm in 2020    Atrial flutter (HCC)    Dyslipidemia    OSA on CPAP 01/20/2014   Paroxysmal atrial fibrillation [facility])     Past Surgical History:  Procedure Laterality Date  A-FLUTTER ABLATION N/A 03/22/2017   Procedure: A-FLUTTER ABLATION;  Surgeon: Thompson Grayer, MD;  Location: [place] CV [facility];  Service: Cardiovascular;  Laterality: N/A;   CRANIOTOMY Right 09/04/2017   Procedure: CRANIOTOMY FOR SUBDURAL HEMATOMA;  Surgeon: Erline Levine, MD;  Location: [place];  Service: Neurosurgery;  Laterality: Right;    FAMHx:  Family History  Problem Relation Age of Onset   Diabetes Father    Stroke Father    Hypertension Mother        ?   Heart attack Maternal Grandfather    Colon cancer Neg Hx     SOCHx:   reports that he  quit smoking about 22 years ago. His smoking use included cigarettes. He has never used smokeless tobacco. He reports current alcohol use of about 14.0 - 21.0 standard drinks per week. He reports that he does not use drugs.  ALLERGIES:  Allergies  Allergen Reactions   Ativan [Lorazepam]     irritable and agitated    ROS: Pertinent items noted in HPI and remainder of comprehensive ROS otherwise negative.  HOME MEDS: Current Outpatient Medications  Medication Sig Dispense Refill   acyclovir (ZOVIRAX) 800 MG tablet TAKE ONE TABLET BY MOUTH THREE TIMES A DAY AS NEEDED 30 tablet 3   aspirin EC 81 MG tablet Take 1 tablet (81 mg total) by mouth daily. Swallow whole. 90 tablet 3   CALCIUM-MAGNESIUM-ZINC PO Take 1 capsule by mouth daily.     cholecalciferol (VITAMIN D) 1000 units tablet Take 1,000 Units by mouth daily.     Coenzyme Q10 (CO Q 10 PO) Take 1 capsule by mouth daily.     cyclobenzaprine (FLEXERIL) 10 MG tablet TAKE ONE TABLET BY MOUTH THREE TIMES A DAY AS NEEDED FOR MUSCLE SPASMS 15 tablet 0   diltiazem (CARDIZEM) 30 MG tablet Take 1 tablet every 4 hours AS NEEDED for heart rate >100 30 tablet 1   ezetimibe (ZETIA) 10 MG tablet Take 1 tablet (10 mg total) by mouth daily. 30 tablet 11   Multiple Vitamin (MULTIVITAMIN) capsule Take 1 capsule by mouth daily.     nitroGLYCERIN (NITROSTAT) 0.4 MG SL tablet Place 1 tablet (0.4 mg total) under the tongue every 5 (five) minutes as needed for chest pain (If you take 3 tablets without resolution of chest pain, proceed to ER for evaluation.). 30 tablet 3   rosuvastatin (CRESTOR) 40 MG tablet Take 1 tablet (40 mg total) by mouth daily. 90 tablet 3   No current facility-administered medications for this visit.    LABS/IMAGING: No results found for this or any previous visit (from the past 48 hour(s)). No results found.  VITALS: BP 108/72   Pulse 65   Ht 6' (1.829 m)   Wt 185 lb 3.2 oz (84 kg)   SpO2 97%   BMI 25.12 kg/m    EXAM: General appearance: alert and no distress Neck: no carotid bruit, no JVD and thyroid not enlarged, symmetric, no tenderness/mass/nodules Lungs: clear to auscultation bilaterally Heart: regular rate and rhythm, S1, S2 normal, no murmur, click, rub or gallop Abdomen: soft, non-tender; bowel sounds normal; no masses,  no organomegaly Extremities: extremities normal, atraumatic, no cyanosis or edema Pulses: 2+ and symmetric Skin: Skin color, texture, turgor normal. 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   Mr.  Byrd not had recurrent atrial fibrillation after suffering a traumatic subdural hematoma.  He does have a history of successful atrial flutter ablation earlier this year.  He is compliant with CPAP.  He possibly could have recurrent A. fib because it is unusual that he developed this.  Although it may have been related to his trauma, it is also strange that he developed a subdural hematoma apparently a month after having had unintentional head injury.  Plan follow-up with me as needed.  Pixie Casino, MD, [facility], [place] Director of the Advanced Lipid Disorders &  Cardiovascular Risk Reduction Clinic Diplomate of the American Board of Clinical Lipidology Attending Cardiologist  Direct Dial: [phone]  Fax: [phone]  Website:  [email or web address] 07/26/2021, 4:18 PM

## 2021-07-27 ENCOUNTER — Ambulatory Visit: Payer: Medicare Other

## 2021-07-28 DIAGNOSIS — H401131 Primary open-angle glaucoma, bilateral, mild stage: Secondary | ICD-10-CM | POA: Diagnosis not present

## 2021-08-16 ENCOUNTER — Encounter: Payer: Self-pay | Admitting: Internal Medicine

## 2021-11-16 DIAGNOSIS — L578 Other skin changes due to chronic exposure to nonionizing radiation: Secondary | ICD-10-CM | POA: Diagnosis not present

## 2021-11-16 DIAGNOSIS — L814 Other melanin hyperpigmentation: Secondary | ICD-10-CM | POA: Diagnosis not present

## 2021-11-16 DIAGNOSIS — D225 Melanocytic nevi of trunk: Secondary | ICD-10-CM | POA: Diagnosis not present

## 2021-11-16 DIAGNOSIS — C44612 Basal cell carcinoma of skin of right upper limb, including shoulder: Secondary | ICD-10-CM | POA: Diagnosis not present

## 2021-11-16 DIAGNOSIS — L821 Other seborrheic keratosis: Secondary | ICD-10-CM | POA: Diagnosis not present

## 2021-11-16 DIAGNOSIS — Z85828 Personal history of other malignant neoplasm of skin: Secondary | ICD-10-CM | POA: Diagnosis not present

## 2021-11-16 DIAGNOSIS — D485 Neoplasm of uncertain behavior of skin: Secondary | ICD-10-CM | POA: Diagnosis not present

## 2021-11-16 DIAGNOSIS — L570 Actinic keratosis: Secondary | ICD-10-CM | POA: Diagnosis not present

## 2021-11-25 DIAGNOSIS — Z23 Encounter for immunization: Secondary | ICD-10-CM | POA: Diagnosis not present

## 2021-12-01 DIAGNOSIS — I2510 Atherosclerotic heart disease of native coronary artery without angina pectoris: Secondary | ICD-10-CM | POA: Diagnosis not present

## 2021-12-02 LAB — NMR, LIPOPROFILE
Cholesterol, Total: 131 mg/dL (ref 100–199)
HDL Particle Number: 42.2 umol/L (ref >=30.5)
HDL-C: 59 mg/dL (ref >39)
LDL Particle Number: 657 nmol/L (ref <1000)
LDL Size: 20.1 nm — ABNORMAL LOW (ref >20.5)
LDL-C (NIH Calc): 54 mg/dL (ref 0–99)
LP-IR Score: 49 — ABNORMAL HIGH (ref <=45)
Small LDL Particle Number: 456 nmol/L (ref <=527)
Triglycerides: 97 mg/dL (ref 0–149)

## 2021-12-02 LAB — LIPOPROTEIN A (LPA): Lipoprotein (a): 62.8 nmol/L (ref <75.0)

## 2021-12-05 ENCOUNTER — Other Ambulatory Visit (HOSPITAL_COMMUNITY): Payer: Self-pay | Admitting: Nurse Practitioner

## 2021-12-09 ENCOUNTER — Ambulatory Visit: Payer: Medicare Other | Attending: Internal Medicine | Admitting: Internal Medicine

## 2021-12-09 ENCOUNTER — Encounter: Payer: Self-pay | Admitting: Internal Medicine

## 2021-12-09 VITALS — BP 108/68 | HR 58 | Ht 72.0 in | Wt 187.0 lb

## 2021-12-09 DIAGNOSIS — E785 Hyperlipidemia, unspecified: Secondary | ICD-10-CM | POA: Diagnosis not present

## 2021-12-09 DIAGNOSIS — E7849 Other hyperlipidemia: Secondary | ICD-10-CM | POA: Diagnosis not present

## 2021-12-09 DIAGNOSIS — I2510 Atherosclerotic heart disease of native coronary artery without angina pectoris: Secondary | ICD-10-CM

## 2021-12-09 DIAGNOSIS — I77819 Aortic ectasia, unspecified site: Secondary | ICD-10-CM | POA: Insufficient documentation

## 2021-12-09 DIAGNOSIS — I25118 Atherosclerotic heart disease of native coronary artery with other forms of angina pectoris: Secondary | ICD-10-CM | POA: Diagnosis not present

## 2021-12-09 NOTE — Progress Notes (Signed)
OFFICE NOTE  Chief Complaint:  Routine follow-up  Primary Care Physician: Dorothyann Peng, NP  HPI:  Steven Byrd is a 66 y.o. male with a past medical history significant for hyperlipidemia. He has been having recurrent palpitations over the past month, but worse in the past 2 days. He denies chest pain or dyspnea. He has been able to exercise without much difficulty, however, he noted his HR was higher than normal. Typically his resting HR is in the 50-60 range. He went to his PCP who referred him to the ER for an SVT. He was noted to be in 2:1 atrial flutter in the ER. He was given adenosine in the ER which caused a pause, but did not slow his rhythm. He was then given 15 mg IV cardizem and eventually converted back to sinus rhythm. His CHADSVASC score is 0. At discharge I recommended he stay on aspirin and start on long-acting Cardizem. He said that he wished to only take medication as needed. I warned him that he may have recurrent atrial flutter and affect did have a couple of episodes. He called the office and was eventually placed on Cardizem 120 mg 3 times a day. He's been taking this dose and has noted no recurrence of his atrial flutter. He had several questions today about management of atrial flutter and is pretty clearly is not what take medications for long periods of time. In addition he reports that in the past he's had an informal sleep study and underwent a uvulopalatoplasty, but still reports that he is told that he snores, stops breathing and may very well have apnea. Of course this may be a risk factor for his atrial flutter. He did undergo an echocardiogram which showed a normal EF, normal wall motion and normal diastolic function. Wall thickness is also normal. Chamber sizes are normal. There was a top normal descending aortic diameter. Otherwise agree benign echocardiogram. As mentioned in my hospital consult note, he is very athletic, exercises a number days a week  including long-distance running and cycling and he denies any anginal symptoms.  07/23/2017  Steven Byrd returns today for follow-up of atrial flutter.  He recently was evaluated by Dr. Rayann Heman and determined to be candidate for atrial flutter ablation.  He underwent that procedure in January.  This was successful and resolve the symptoms.  He is CHADSVASC score is 0 and he was instructed to come off of aspirin, diltiazem and wean metoprolol.  He is now off of all medications and doing well.  Blood pressures well controlled.  He denies any chest pain or shortness of breath or recurrent palpitations.  01/09/2018  Steven Byrd was seen today in routine follow-up.  Unfortunately he was recently hospitalized and had some paroxysmal atrial fibrillation.  This was related to a fall which involved a subdural hematoma.  He was placed on Cardizem but cannot be anticoagulated.  He followed up in the A. fib clinic and had been did not to have any recurrent A. fib.  He follows up with me today.  Overall he seems to be doing well.  He is rehabilitating and has follow-up with neurology.  07/27/2021  Steven Byrd returns today for follow-up.  He was recently seen by Laurann Montana, NP.  He had had a calcium score because of concern about coronary artery disease and some recurrent palpitations.  This was markedly elevated at 1517, 94th percentile for age and sex matched control.  Ultimately a CT coronary angiogram was  pursued to rule out any obstructive coronary disease.  This demonstrated again a very high calcium score 1004-61, 93rd percentile for age and sex matched control however only minimal nonobstructive coronary disease which is reassuring.  Today he returns and remains asymptomatic.  He is somewhat concerned as to why he may have had such an early onset significant coronary calcification.  I suspect there is a genetic etiology for this.  Based on this information, he is lipid-lowering was intensified and  currently he is on rosuvastatin 40 and ezetimibe 10 mg daily.  Total cholesterol now 138, triglycerides 49, HDL 64 and LDL 63 which appears to be at target.  12/09/2021  Steven Byrd returns today for follow-up.  Since I last saw him I recommended adding ezetimibe to his statin for better lipid lowering.  So far he seems to be tolerating that well.  He is LDL particle number has come down significantly from 1139-657.  LDL-C now 54 down from 85.  His triglycerides are normal.  Small LDL particle number is low as well.  His LP(a) has been negative.  He denies any recurrent palpitations or atrial fibrillation.  He has had no worsening shortness of breath or chest pain.  He did undergo genetic testing which showed several variants of unknown significance including abnormalities in APO A5 and APO B.  PMHx:  Past Medical History:  Diagnosis Date   AAA (abdominal aortic aneurysm) ([place]) 2020   3.2 cm in 2020    Atrial flutter (HCC)    Dyslipidemia    OSA on CPAP 01/20/2014   Paroxysmal atrial fibrillation ([place])     Past Surgical History:  Procedure Laterality Date   A-FLUTTER ABLATION N/A 03/22/2017   Procedure: A-FLUTTER ABLATION;  Surgeon: Thompson Grayer, MD;  Location: [facility];  Service: Cardiovascular;  Laterality: N/A;   CRANIOTOMY Right 09/04/2017   Procedure: CRANIOTOMY FOR SUBDURAL HEMATOMA;  Surgeon: Erline Levine, MD;  Location: [facility];  Service: Neurosurgery;  Laterality: Right;    FAMHx:  Family History  Problem Relation Age of Onset   Diabetes Father    Stroke Father    Hypertension Mother        ?   Heart attack Maternal Grandfather    Colon cancer Neg Hx     SOCHx:   reports that he quit smoking about 23 years ago. His smoking use included cigarettes. He has never used smokeless tobacco. He reports current alcohol use of about 14.0 - 21.0 standard drinks of alcohol per week. He reports that he does not use drugs.  ALLERGIES:  Allergies  Allergen Reactions    Ativan [Lorazepam]     irritable and agitated    ROS: Pertinent items noted in HPI and remainder of comprehensive ROS otherwise negative.  HOME MEDS: Current Outpatient Medications  Medication Sig Dispense Refill   acyclovir (ZOVIRAX) 800 MG tablet TAKE ONE TABLET BY MOUTH THREE TIMES A DAY AS NEEDED 30 tablet 3   aspirin EC 81 MG tablet Take 1 tablet (81 mg total) by mouth daily. Swallow whole. 90 tablet 3   CALCIUM-MAGNESIUM-ZINC PO Take 1 capsule by mouth daily.     cholecalciferol (VITAMIN D) 1000 units tablet Take 1,000 Units by mouth daily.     Coenzyme Q10 (CO Q 10 PO) Take 1 capsule by mouth daily.     cyclobenzaprine (FLEXERIL) 10 MG tablet TAKE ONE TABLET BY MOUTH THREE TIMES A DAY AS NEEDED FOR MUSCLE SPASMS 15 tablet 0   diltiazem (CARDIZEM)  30 MG tablet TAKE 1 TABLET BY MOUTH EVERY 4 HOURS AS NEEDED FOR HEART RATE >100 30 tablet 2   Multiple Vitamin (MULTIVITAMIN) capsule Take 1 capsule by mouth daily.     rosuvastatin (CRESTOR) 40 MG tablet Take 1 tablet (40 mg total) by mouth daily. 90 tablet 3   ezetimibe (ZETIA) 10 MG tablet Take 1 tablet (10 mg total) by mouth daily. 30 tablet 11   nitroGLYCERIN (NITROSTAT) 0.4 MG SL tablet Place 1 tablet (0.4 mg total) under the tongue every 5 (five) minutes as needed for chest pain (If you take 3 tablets without resolution of chest pain, proceed to ER for evaluation.). 30 tablet 3   No current facility-administered medications for this visit.    LABS/IMAGING: No results found for this or any previous visit (from the past 48 hour(s)). No results found.  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  PLAN: 1.   Steven Byrd is now at target with his lipids.  We will continue her current therapies.  Plan a repeat lipid profile in 1 year and follow-up with me at that time.  He will have a repeat CT scan of the aorta to assess his aortic aneurysm in February.  Pixie Casino, MD, [facility], [place] Director of the Advanced Lipid Disorders &  Cardiovascular Risk Reduction Clinic Diplomate of the American Board of Clinical Lipidology Attending Cardiologist  Direct Dial: [phone]  Fax: [phone]  Website:  [email or web address] 12/09/2021, 11:40 AM

## 2021-12-09 NOTE — Patient Instructions (Signed)
Medication Instructions:  Your physician recommends that you continue on your current medications as directed. Please refer to the Current Medication list given to you today.  *If you need a refill on your cardiac medications before your next appointment, please call your pharmacy*  Testing/Procedures: CTA chest/aorta due in February 2024 --lab work needed 1-2 weeks prior  Follow-Up: At [facility], you and your health needs are our priority.  As part of our continuing mission to provide you with exceptional heart care, we have created designated Provider Care Teams.  These Care Teams include your primary Cardiologist (physician) and Advanced Practice Providers (APPs -  Physician Assistants and Nurse Practitioners) who all work together to provide you with the care you need, when you need it.  We recommend signing up for the patient portal called "MyChart".  Sign up information is provided on this After Visit Summary.  MyChart is used to connect with patients for Virtual Visits (Telemedicine).  Patients are able to view lab/test results, encounter notes, upcoming appointments, etc.  Non-urgent messages can be sent to your provider as well.   To learn more about what you can do with MyChart, go to [email or web address].    Your next appointment:   12 month(s)  The format for your next appointment:   In Person  Provider:   Pixie Casino, MD

## 2021-12-15 NOTE — Addendum Note (Signed)
Addended by: Fidel Levy on: 12/15/2021 08:31 AM   Modules accepted: Orders

## 2021-12-20 DIAGNOSIS — C44612 Basal cell carcinoma of skin of right upper limb, including shoulder: Secondary | ICD-10-CM | POA: Diagnosis not present

## 2021-12-21 ENCOUNTER — Encounter: Payer: Self-pay | Admitting: Adult Health

## 2021-12-21 ENCOUNTER — Ambulatory Visit (INDEPENDENT_AMBULATORY_CARE_PROVIDER_SITE_OTHER): Payer: Medicare Other | Admitting: Adult Health

## 2021-12-21 VITALS — BP 120/70 | HR 55 | Temp 98.2°F | Ht 72.0 in | Wt 193.0 lb

## 2021-12-21 DIAGNOSIS — I483 Typical atrial flutter: Secondary | ICD-10-CM | POA: Diagnosis not present

## 2021-12-21 DIAGNOSIS — G4733 Obstructive sleep apnea (adult) (pediatric): Secondary | ICD-10-CM | POA: Diagnosis not present

## 2021-12-21 DIAGNOSIS — B009 Herpesviral infection, unspecified: Secondary | ICD-10-CM

## 2021-12-21 DIAGNOSIS — F5102 Adjustment insomnia: Secondary | ICD-10-CM | POA: Diagnosis not present

## 2021-12-21 DIAGNOSIS — Z23 Encounter for immunization: Secondary | ICD-10-CM

## 2021-12-21 DIAGNOSIS — E785 Hyperlipidemia, unspecified: Secondary | ICD-10-CM

## 2021-12-21 DIAGNOSIS — I2510 Atherosclerotic heart disease of native coronary artery without angina pectoris: Secondary | ICD-10-CM | POA: Diagnosis not present

## 2021-12-21 DIAGNOSIS — N400 Enlarged prostate without lower urinary tract symptoms: Secondary | ICD-10-CM

## 2021-12-21 LAB — COMPREHENSIVE METABOLIC PANEL
ALT: 34 U/L (ref 0–53)
AST: 25 U/L (ref 0–37)
Albumin: 4.2 g/dL (ref 3.5–5.2)
Alkaline Phosphatase: 56 U/L (ref 39–117)
BUN: 23 mg/dL (ref 6–23)
CO2: 28 mEq/L (ref 19–32)
Calcium: 9 mg/dL (ref 8.4–10.5)
Chloride: 105 mEq/L (ref 96–112)
Creatinine, Ser: 1.04 mg/dL (ref 0.40–1.50)
GFR: 73.85 mL/min (ref >60.00)
Glucose, Bld: 93 mg/dL (ref 70–99)
Potassium: 4.4 mEq/L (ref 3.5–5.1)
Sodium: 140 mEq/L (ref 135–145)
Total Bilirubin: 0.7 mg/dL (ref 0.2–1.2)
Total Protein: 7 g/dL (ref 6.0–8.3)

## 2021-12-21 LAB — CBC WITH DIFFERENTIAL/PLATELET
Basophils Absolute: 0 10*3/uL (ref 0.0–0.1)
Basophils Relative: 0.8 % (ref 0.0–3.0)
Eosinophils Absolute: 0.2 10*3/uL (ref 0.0–0.7)
Eosinophils Relative: 3.2 % (ref 0.0–5.0)
HCT: 38.2 % — ABNORMAL LOW (ref 39.0–52.0)
Hemoglobin: 12.7 g/dL — ABNORMAL LOW (ref 13.0–17.0)
Lymphocytes Relative: 34.5 % (ref 12.0–46.0)
Lymphs Abs: 1.9 10*3/uL (ref 0.7–4.0)
MCHC: 33.1 g/dL (ref 30.0–36.0)
MCV: 101.4 fl — ABNORMAL HIGH (ref 78.0–100.0)
Monocytes Absolute: 0.6 10*3/uL (ref 0.1–1.0)
Monocytes Relative: 11.4 % (ref 3.0–12.0)
Neutro Abs: 2.8 10*3/uL (ref 1.4–7.7)
Neutrophils Relative %: 50.1 % (ref 43.0–77.0)
Platelets: 205 10*3/uL (ref 150.0–400.0)
RBC: 3.77 Mil/uL — ABNORMAL LOW (ref 4.22–5.81)
RDW: 12.9 % (ref 11.5–15.5)
WBC: 5.5 10*3/uL (ref 4.0–10.5)

## 2021-12-21 LAB — LIPID PANEL
Cholesterol: 155 mg/dL (ref 0–200)
HDL: 55.5 mg/dL (ref >39.00)
LDL Cholesterol: 79 mg/dL (ref 0–99)
NonHDL: 99.26
Total CHOL/HDL Ratio: 3
Triglycerides: 102 mg/dL (ref 0.0–149.0)
VLDL: 20.4 mg/dL (ref 0.0–40.0)

## 2021-12-21 LAB — PSA: PSA: 0.48 ng/mL (ref 0.10–4.00)

## 2021-12-21 LAB — TSH: TSH: 1.79 u[IU]/mL (ref 0.35–5.50)

## 2021-12-21 MED ORDER — ACYCLOVIR 800 MG PO TABS: 800.0000 mg | ORAL_TABLET | Freq: Three times a day (TID) | ORAL | 3 refills | Status: AC | PRN

## 2021-12-21 MED ORDER — DIAZEPAM 5 MG PO TABS: 5.0000 mg | ORAL_TABLET | Freq: Every evening | ORAL | 0 refills | Status: DC | PRN

## 2021-12-21 NOTE — Progress Notes (Signed)
Subjective:    Patient ID: Steven Byrd, male    DOB: 1955/10/08, 66 y.o.   MRN: [id number]  HPI Patient presents for yearly preventative medicine examination. He is a pleasant 66 year old male who  has a past medical history of AAA (abdominal aortic aneurysm) ([place]) (2020), Atrial flutter ([place]), Dyslipidemia, OSA on CPAP (01/20/2014), and Paroxysmal atrial fibrillation ([place]).  He is retired and is doing a lot of international travel.   Familial dyslipidemia - - managed with Crestor 40 mg daily and Zetia 10 mg daily.[name] He denies myalgia or fatigue.  He is seen by cardiology.  Had a EAC score 1461 which put him in the 93rd percentile for age and sex, however had minimal nonobstructive coronary artery disease in February 2023.  Lab Results  Component Value Date   CHOL 138 05/27/2021   HDL 64 05/27/2021   LDLCALC 63 05/27/2021   LDLDIRECT 126.8 02/01/2010   TRIG 49 05/27/2021   CHOLHDL 2.2 05/27/2021   OSA-uses CPAP nightly.  Denies daytime somnolence, waking up feeling fatigue, or the need for naps  History of atrial flutter-status post ablation in 2019.  He was seen in the emergency room in August 2022 after noticing that his heart rate was 1 60-1 70s via his Apple Watch.  On going to the emergency room he was back into normal sinus rhythm.  He followed up with cardiology later that month and was prescribed a prescription for Cardizem 30 mg as needed if he has recurrent symptoms. 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  Insomnia - takes valium periodically.   BPH - asymptomatic   Appropriate screening laboratory values were ordered for the patient including screening of hyperlipidemia, renal function and hepatic function. If indicated by BPH, a PSA was ordered.  Medication reconciliation,   past medical history, social history, problem list and allergies were reviewed in detail with the patient  Goals were established with regard to weight loss, exercise, and  diet in compliance with medications. He is going to the gym and golfing a few times a week  Wt Readings from Last 3 Encounters:  12/21/21 193 lb (87.5 kg)  12/09/21 187 lb (84.8 kg)  07/26/21 185 lb 3.2 oz (84 kg)   He is up to date on routine colon cancer screening and has had his yearly skin cancer screening.  He has no acute complaints today   Review of Systems  Constitutional: Negative.   HENT: Negative.    Eyes: Negative.   Respiratory: Negative.    Cardiovascular: Negative.   Gastrointestinal: Negative.   Endocrine: Negative.   Genitourinary: Negative.   Musculoskeletal: Negative.   Skin: Negative.   Allergic/Immunologic: Negative.   Neurological: Negative.   Hematological: Negative.   Psychiatric/Behavioral: Negative.    All other systems reviewed and are negative.  Past Medical History:  Diagnosis Date   AAA (abdominal aortic aneurysm) ([place]) 2020   3.2 cm in 2020    Atrial flutter (HCC)    Dyslipidemia    OSA on CPAP 01/20/2014   Paroxysmal atrial fibrillation (HCC)     Social History   Socioeconomic History   Marital status: Divorced    Spouse name: Not on file   Number of children: Not on file   Years of education: Not on file  Highest education level: Not on file  Occupational History   Not on file  Tobacco Use   Smoking status: Former    Types: Cigarettes    Quit date: 10/16/1998    Years since quitting: 23.1   Smokeless tobacco: Never  Vaping Use   Vaping Use: Never used  Substance and Sexual Activity   Alcohol use: Yes    Alcohol/week: 14.0 - 21.0 standard drinks of alcohol    Types: 14 - 21 Glasses of wine per week    Comment: red wine   Drug use: No   Sexual activity: Yes    Partners: Female  Other Topics Concern   Not on file  Social History Narrative   Has a  Licensed conveyancer company since 1978      Daughter who is 65 and a Son who is 43   - Daughter in [place], Son is in [place].       He likes to hike, scuba dive, and travel      Lives in single story home    [name] - significant other   4 year degree   Right handed    Social Determinants of Health   Financial Resource Strain: Low Risk  (07/22/2021)   Overall Financial Resource Strain (CARDIA)    Difficulty of Paying Living Expenses: Not hard at all  Food Insecurity: No Food Insecurity (07/22/2021)   Hunger Vital Sign    Worried About Running Out of Food in the Last Year: Never true    Ran Out of Food in the Last Year: Never true  Transportation Needs: No Transportation Needs (07/22/2021)   PRAPARE - Hydrologist (Medical): No    Lack of Transportation (Non-Medical): No  Physical Activity: Sufficiently Active (07/22/2021)   Exercise Vital Sign    Days of Exercise per Week: 5 days    Minutes of Exercise per Session: 60 min  Stress: No Stress Concern Present (07/22/2021)   [place]    Feeling of Stress : Not at all  Social Connections: Moderately Integrated (07/22/2021)   Social Connection and Isolation Panel [NHANES]    Frequency of Communication with Friends and Family: More than three times a week    Frequency of Social Gatherings with Friends and Family: More than three times a week    Attends Religious Services: More than 4 times per year    Active Member of Clubs or Organizations: Yes    Attends Archivist Meetings: More than 4 times per year    Marital Status: Divorced  Intimate Partner Violence: Not At Risk (07/22/2021)   Humiliation, Afraid, Rape, and Kick questionnaire    Fear of Current or Ex-Partner: No    Emotionally Abused: No    Physically Abused: No    Sexually Abused: No    Past Surgical History:  Procedure Laterality Date   A-FLUTTER ABLATION N/A 03/22/2017    Procedure: A-FLUTTER ABLATION;  Surgeon: Thompson Grayer, MD;  Location: [facility];  Service: Cardiovascular;  Laterality: N/A;   CRANIOTOMY Right 09/04/2017   Procedure: CRANIOTOMY FOR SUBDURAL HEMATOMA;  Surgeon: Erline Levine, MD;  Location: [facility];  Service: Neurosurgery;  Laterality: Right;    Family History  Problem Relation Age of Onset   Diabetes Father    Stroke Father    Hypertension Mother        ?   Heart attack Maternal Grandfather    Colon  cancer Neg Hx     Allergies  Allergen Reactions   Ativan [Lorazepam]     irritable and agitated    Current Outpatient Medications on File Prior to Visit  Medication Sig Dispense Refill   aspirin EC 81 MG tablet Take 1 tablet (81 mg total) by mouth daily. Swallow whole. 90 tablet 3   Bacillus Coagulans-Inulin (PROBIOTIC) 1-250 BILLION-MG CAPS as directed Orally     CALCIUM-MAGNESIUM-ZINC PO Take 1 capsule by mouth daily.     cholecalciferol (VITAMIN D) 1000 units tablet Take 1,000 Units by mouth daily.     Coenzyme Q10 (CO Q 10 PO) Take 1 capsule by mouth daily.     Coenzyme Q10 (COQ-10) 100 MG CAPS CoQ-10     [employer] SUNSCREEN SPF 30 EX apply topically to face and body daily for 30     cyclobenzaprine (FLEXERIL) 10 MG tablet TAKE ONE TABLET BY MOUTH THREE TIMES A DAY AS NEEDED FOR MUSCLE SPASMS 15 tablet 0   diltiazem (CARDIZEM) 30 MG tablet TAKE 1 TABLET BY MOUTH EVERY 4 HOURS AS NEEDED FOR HEART RATE >100 30 tablet 2   Multiple Vitamin (MULTIVITAMIN) capsule Take 1 capsule by mouth daily.     rosuvastatin (CRESTOR) 40 MG tablet Take 1 tablet (40 mg total) by mouth daily. 90 tablet 3   ezetimibe (ZETIA) 10 MG tablet Take 1 tablet (10 mg total) by mouth daily. 30 tablet 11   nitroGLYCERIN (NITROSTAT) 0.4 MG SL tablet Place 1 tablet (0.4 mg total) under the tongue every 5 (five) minutes as needed for chest pain (If you take 3 tablets without resolution of chest pain, proceed to ER for evaluation.). 30 tablet 3   No current  facility-administered medications on file prior to visit.    BP 120/70   Pulse (!) 55   Temp 98.2 F (36.8 C) (Oral)   Ht 6' (1.829 m)   Wt 193 lb (87.5 kg)   SpO2 98%   BMI 26.18 kg/m       Objective:   Physical Exam Vitals and nursing note reviewed.  Constitutional:      General: He is not in acute distress.    Appearance: Normal appearance. He is well-developed and normal weight.  HENT:     Head: Normocephalic and atraumatic.     Right Ear: Tympanic membrane, ear canal and external ear normal. There is no impacted cerumen.     Left Ear: Tympanic membrane, ear canal and external ear normal. There is no impacted cerumen.     Nose: Nose normal. No congestion or rhinorrhea.     Mouth/Throat:     Mouth: Mucous membranes are moist.     Pharynx: Oropharynx is clear. No oropharyngeal exudate or posterior oropharyngeal erythema.  Eyes:     General:        Right eye: No discharge.        Left eye: No discharge.     Extraocular Movements: Extraocular movements intact.     Conjunctiva/sclera: Conjunctivae normal.     Pupils: Pupils are equal, round, and reactive to light.  Neck:     Vascular: No carotid bruit.     Trachea: No tracheal deviation.  Cardiovascular:     Rate and Rhythm: Normal rate and regular rhythm.     Pulses: Normal pulses.     Heart sounds: Normal heart sounds. No murmur heard.    No friction rub. No gallop.  Pulmonary:     Effort: Pulmonary effort is normal. No  respiratory distress.     Breath sounds: Normal breath sounds. No stridor. No wheezing, rhonchi or rales.  Chest:     Chest wall: No tenderness.  Abdominal:     General: Bowel sounds are normal. There is no distension.     Palpations: Abdomen is soft. There is no mass.     Tenderness: There is no abdominal tenderness. There is no right CVA tenderness, left CVA tenderness, guarding or rebound.     Hernia: No hernia is present.  Musculoskeletal:        General: No swelling, tenderness, deformity  or signs of injury. Normal range of motion.     Right lower leg: No edema.     Left lower leg: No edema.  Lymphadenopathy:     Cervical: No cervical adenopathy.  Skin:    General: Skin is warm and dry.     Capillary Refill: Capillary refill takes less than 2 seconds.     Coloration: Skin is not jaundiced or pale.     Findings: No bruising, erythema, lesion or rash.  Neurological:     General: No focal deficit present.     Mental Status: He is alert and oriented to person, place, and time.     Cranial Nerves: No cranial nerve deficit.     Sensory: No sensory deficit.     Motor: No weakness.     Coordination: Coordination normal.     Gait: Gait normal.     Deep Tendon Reflexes: Reflexes normal.  Psychiatric:        Mood and Affect: Mood normal.        Behavior: Behavior normal.        Thought Content: Thought content normal.        Judgment: Judgment normal.       Assessment & Plan:  1. Dyslipidemia - Continue statin and Zetia  - CBC with Differential/Platelet; Future - Comprehensive metabolic panel; Future - Lipid panel; Future - TSH; Future  2. Benign prostatic hyperplasia without lower urinary tract symptoms  - PSA; Future  3. OSA on CPAP - Continue CPAP  - CBC with Differential/Platelet; Future - Comprehensive metabolic panel; Future - Lipid panel; Future - TSH; Future  4. HSV-1 (herpes simplex virus 1) infection  - CBC with Differential/Platelet; Future - Comprehensive metabolic panel; Future - Lipid panel; Future - TSH; Future - acyclovir (ZOVIRAX) 800 MG tablet; Take 1 tablet (800 mg total) by mouth 3 (three) times daily as needed.  Dispense: 30 tablet; Refill: 3  5. Typical atrial flutter (HCC) - Normal rate and rhythm today  - CBC with Differential/Platelet; Future - Comprehensive metabolic panel; Future - Lipid panel; Future - TSH; Future  6. Transient insomnia  - diazepam (VALIUM) 5 MG tablet; Take 1 tablet (5 mg total) by mouth at bedtime as  needed for anxiety.  Dispense: 15 tablet; Refill: 0   7. Need for immunization against influenza  - Flu Vaccine QUAD High Dose(Fluad)  Dorothyann Peng, NP

## 2021-12-21 NOTE — Patient Instructions (Signed)
It was great seeing you today   We will follow up with you regarding your lab work   Please let me know if you need anything   

## 2022-01-11 DIAGNOSIS — H401131 Primary open-angle glaucoma, bilateral, mild stage: Secondary | ICD-10-CM | POA: Diagnosis not present

## 2022-01-11 DIAGNOSIS — H25813 Combined forms of age-related cataract, bilateral: Secondary | ICD-10-CM | POA: Diagnosis not present

## 2022-01-11 DIAGNOSIS — H353131 Nonexudative age-related macular degeneration, bilateral, early dry stage: Secondary | ICD-10-CM | POA: Diagnosis not present

## 2022-01-11 DIAGNOSIS — H04123 Dry eye syndrome of bilateral lacrimal glands: Secondary | ICD-10-CM | POA: Diagnosis not present

## 2022-01-22 ENCOUNTER — Other Ambulatory Visit (HOSPITAL_BASED_OUTPATIENT_CLINIC_OR_DEPARTMENT_OTHER): Payer: Self-pay | Admitting: Family

## 2022-01-22 DIAGNOSIS — E7849 Other hyperlipidemia: Secondary | ICD-10-CM

## 2022-01-22 DIAGNOSIS — E785 Hyperlipidemia, unspecified: Secondary | ICD-10-CM

## 2022-01-22 DIAGNOSIS — I2510 Atherosclerotic heart disease of native coronary artery without angina pectoris: Secondary | ICD-10-CM

## 2022-01-23 NOTE — Telephone Encounter (Signed)
Rx(s) sent to pharmacy electronically.  

## 2022-03-05 ENCOUNTER — Telehealth: Payer: Medicare Other | Admitting: Urgent Care

## 2022-03-05 DIAGNOSIS — J208 Acute bronchitis due to other specified organisms: Secondary | ICD-10-CM | POA: Diagnosis not present

## 2022-03-05 MED ORDER — PREDNISONE 50 MG PO TABS: 50.0000 mg | ORAL_TABLET | Freq: Every day | ORAL | 0 refills | Status: AC

## 2022-03-05 MED ORDER — MONTELUKAST SODIUM 10 MG PO TABS: 10.0000 mg | ORAL_TABLET | Freq: Every day | ORAL | 0 refills | Status: DC

## 2022-03-05 MED ORDER — BENZONATATE 100 MG PO CAPS: 100.0000 mg | ORAL_CAPSULE | Freq: Two times a day (BID) | ORAL | 0 refills | Status: DC | PRN

## 2022-03-05 NOTE — Patient Instructions (Signed)
[name], thank you for joining [name], PA for today's virtual visit.  While this provider is not your primary care provider (PCP), if your PCP is located in our provider database this encounter information will be shared with them immediately following your visit.   [place] account gives you access to today's visit and all your visits, tests, and labs performed at [facility] " click here if you don't have a [facility] account or go to [email or web address]  Consent: (Patient) Steven Byrd provided verbal consent for this virtual visit at the beginning of the encounter.  Current Medications:  Current Outpatient Medications:    benzonatate (TESSALON) 100 MG capsule, Take 1 capsule (100 mg total) by mouth 2 (two) times daily as needed for cough., Disp: 20 capsule, Rfl: 0   montelukast (SINGULAIR) 10 MG tablet, Take 1 tablet (10 mg total) by mouth at bedtime., Disp: 14 tablet, Rfl: 0   predniSONE (DELTASONE) 50 MG tablet, Take 1 tablet (50 mg total) by mouth daily with breakfast for 4 days., Disp: 4 tablet, Rfl: 0   acyclovir (ZOVIRAX) 800 MG tablet, Take 1 tablet (800 mg total) by mouth 3 (three) times daily as needed., Disp: 30 tablet, Rfl: 3   aspirin EC 81 MG tablet, Take 1 tablet (81 mg total) by mouth daily. Swallow whole., Disp: 90 tablet, Rfl: 3   Bacillus Coagulans-Inulin (PROBIOTIC) 1-250 BILLION-MG CAPS, as directed Orally, Disp: , Rfl:    CALCIUM-MAGNESIUM-ZINC PO, Take 1 capsule by mouth daily., Disp: , Rfl:    cholecalciferol (VITAMIN D) 1000 units tablet, Take 1,000 Units by mouth daily., Disp: , Rfl:    Coenzyme Q10 (CO Q 10 PO), Take 1 capsule by mouth daily., Disp: , Rfl:    Coenzyme Q10 (COQ-10) 100 MG CAPS, CoQ-10, Disp: , Rfl:    [employer]S SUNSCREEN SPF 30 EX, apply topically to face and body daily for 30, Disp: , Rfl:    cyclobenzaprine (FLEXERIL) 10 MG tablet, TAKE ONE TABLET BY MOUTH THREE TIMES A DAY AS NEEDED FOR  MUSCLE SPASMS, Disp: 15 tablet, Rfl: 0   diazepam (VALIUM) 5 MG tablet, Take 1 tablet (5 mg total) by mouth at bedtime as needed for anxiety., Disp: 15 tablet, Rfl: 0   diltiazem (CARDIZEM) 30 MG tablet, TAKE 1 TABLET BY MOUTH EVERY 4 HOURS AS NEEDED FOR HEART RATE >100, Disp: 30 tablet, Rfl: 2   ezetimibe (ZETIA) 10 MG tablet, Take 1 tablet (10 mg total) by mouth daily., Disp: 30 tablet, Rfl: 11   Multiple Vitamin (MULTIVITAMIN) capsule, Take 1 capsule by mouth daily., Disp: , Rfl:    nitroGLYCERIN (NITROSTAT) 0.4 MG SL tablet, Place 1 tablet (0.4 mg total) under the tongue every 5 (five) minutes as needed for chest pain (If you take 3 tablets without resolution of chest pain, proceed to ER for evaluation.)., Disp: 30 tablet, Rfl: 3   rosuvastatin (CRESTOR) 40 MG tablet, TAKE ONE TABLET BY MOUTH DAILY, Disp: 90 tablet, Rfl: 2   Medications ordered in this encounter:  Meds ordered this encounter  Medications   montelukast (SINGULAIR) 10 MG tablet    Sig: Take 1 tablet (10 mg total) by mouth at bedtime.    Dispense:  14 tablet    Refill:  0    Order Specific Question:   Supervising Provider    Answer:   Chase Picket [[id number]]   predniSONE (DELTASONE) 50 MG tablet    Sig: Take 1 tablet (50  mg total) by mouth daily with breakfast for 4 days.    Dispense:  4 tablet    Refill:  0    Order Specific Question:   Supervising Provider    Answer:   Chase Picket [[id number]]   benzonatate (TESSALON) 100 MG capsule    Sig: Take 1 capsule (100 mg total) by mouth 2 (two) times daily as needed for cough.    Dispense:  20 capsule    Refill:  0    Order Specific Question:   Supervising Provider    Answer:   Chase Picket [id number]     *If you need refills on other medications prior to your next appointment, please contact your pharmacy*  Follow-Up: Call back or seek an in-person evaluation if the symptoms worsen or if the condition fails to improve as anticipated.  [place] [phone]  Other Instructions Please start taking prednisone once daily in the morning with breakfast until gone. Take montelukast nightly until symptoms resolve. Use the cough medication up to three times daily as needed. Please stop your multisymptom mucinex and switch to plain Guaifenesin.  If your symptoms persist for an additional two weeks, or if you develop shortness of breath, chest pain or a fever, please head to an in person urgent care.   If you have been instructed to have an in-person evaluation today at a local Urgent Care facility, please use the link below. It will take you to a list of all of our available [facility], including address, phone number and hours of operation. Please do not delay care.  [facility]  If you or a family member do not have a primary care provider, use the link below to schedule a visit and establish care. When you choose a [facility] primary care physician or advanced practice provider, you gain a long-term partner in health. Find a Primary Care Provider  Learn more about 's in-office and virtual care options: [facility]

## 2022-03-05 NOTE — Progress Notes (Signed)
Virtual Visit Consent   Steven Byrd, you are scheduled for a virtual visit with a [facility] provider today. Just as with appointments in the office, your consent must be obtained to participate. Your consent will be active for this visit and any virtual visit you may have with one of our providers in the next 365 days. If you have a MyChart account, a copy of this consent can be sent to you electronically.  As this is a virtual visit, video technology does not allow for your provider to perform a traditional examination. This may limit your provider's ability to fully assess your condition. If your provider identifies any concerns that need to be evaluated in person or the need to arrange testing (such as labs, EKG, etc.), we will make arrangements to do so. Although advances in technology are sophisticated, we cannot ensure that it will always work on either your end or our end. If the connection with a video visit is poor, the visit may have to be switched to a telephone visit. With either a video or telephone visit, we are not always able to ensure that we have a secure connection.  By engaging in this virtual visit, you consent to the provision of healthcare and authorize for your insurance to be billed (if applicable) for the services provided during this visit. Depending on your insurance coverage, you may receive a charge related to this service.  I need to obtain your verbal consent now. Are you willing to proceed with your visit today? [name] has provided verbal consent on 03/05/2022 for a virtual visit (video or telephone). [name], PA  Date: 03/05/2022 11:07 AM  Virtual Visit via Video Note   I, [name], connected with  [name]  ([id number], [birth year]) on 03/05/22 at 11:00 AM EST by a video-enabled telemedicine application and verified that I am speaking with the correct person using two identifiers.  Location: Patient: Virtual Visit  Location Patient: Home Provider: Virtual Visit Location Provider: Home Office   I discussed the limitations of evaluation and management by telemedicine and the availability of in person appointments. The patient expressed understanding and agreed to proceed.    History of Present Illness: Steven Byrd is a 66 y.o. who identifies as a male who was assigned male at birth, and is being seen today for lingering cough.  HPI: 66yo male with c/o cough x 2 weeks. Worse at night. Some post nasal drainage but not much. Took a home Covid test which was negative. No sick contact exposures recently, was exposed to RSV 3 weeks ago. No audible wheezing. Minimal tightness in chest. No CP or palpitations, mucinex for the past few nights otherwise no OTC meds. Pt denies ear pain, headache or sneezing. Hx of afib, tx with ablation several years ago. Hx OSA, no other pulmonary issues. Cough primarily dry. Additionally pt reports some lower back pain. Has been taking flexeril with minimal relief. No GI / GU sx. Going to [place] in a few weeks.  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  Allergies:  Allergies  Allergen Reactions   Ativan [Lorazepam]     irritable and agitated   Medications:  Current Outpatient Medications:    benzonatate (TESSALON) 100 MG capsule, Take 1 capsule (100 mg total) by mouth 2 (two) times daily as needed for cough., Disp: 20 capsule, Rfl: 0   montelukast (SINGULAIR) 10 MG tablet, Take 1 tablet (10 mg total) by mouth at bedtime., Disp: 14 tablet, Rfl: 0   predniSONE  (DELTASONE) 50 MG tablet, Take 1 tablet (50 mg total) by mouth daily with breakfast for 4 days., Disp: 4 tablet, Rfl: 0   acyclovir (ZOVIRAX) 800 MG tablet, Take 1 tablet (800 mg total) by mouth 3 (three) times daily as needed., Disp: 30 tablet, Rfl: 3   aspirin EC 81 MG tablet, Take 1 tablet (81 mg total) by mouth daily. Swallow whole., Disp: 90 tablet, Rfl: 3   Bacillus Coagulans-Inulin (PROBIOTIC) 1-250 BILLION-MG CAPS, as directed Orally, Disp: , Rfl:    CALCIUM-MAGNESIUM-ZINC PO, Take 1 capsule by mouth daily., Disp: , Rfl:    cholecalciferol (VITAMIN D) 1000 units tablet, Take 1,000 Units by mouth daily., Disp: , Rfl:    Coenzyme Q10 (CO Q 10 PO), Take 1 capsule by mouth daily., Disp: , Rfl:    Coenzyme Q10 (COQ-10) 100 MG CAPS, CoQ-10, Disp: , Rfl:    CVS SUNSCREEN SPF 30 EX, apply topically to face and body daily for 30, Disp: , Rfl:    cyclobenzaprine (FLEXERIL) 10 MG tablet, TAKE ONE TABLET BY MOUTH THREE TIMES A DAY AS NEEDED FOR MUSCLE SPASMS, Disp: 15 tablet, Rfl: 0   diazepam (VALIUM) 5 MG tablet, Take 1 tablet (5 mg total) by mouth at bedtime as needed for anxiety., Disp: 15 tablet, Rfl: 0   diltiazem (CARDIZEM) 30 MG tablet, TAKE 1 TABLET BY MOUTH EVERY 4 HOURS AS NEEDED FOR HEART RATE >100, Disp: 30 tablet, Rfl: 2   ezetimibe (ZETIA) 10 MG tablet, Take 1 tablet (10 mg total) by mouth daily., Disp: 30 tablet, Rfl: 11   Multiple Vitamin (MULTIVITAMIN) capsule, Take 1 capsule by mouth daily., Disp: , Rfl:    nitroGLYCERIN (NITROSTAT) 0.4 MG SL tablet, Place 1 tablet (0.4 mg total) under the tongue every 5 (five) minutes as needed for chest pain (If you take 3 tablets without resolution of chest pain, proceed to ER for evaluation.)., Disp: 30 tablet, Rfl: 3   rosuvastatin (CRESTOR) 40 MG tablet, TAKE ONE TABLET BY MOUTH DAILY, Disp: 90 tablet, Rfl: 2  Observations/Objective: Patient is well-developed, well-nourished in no acute distress.  Resting comfortably at home.  Head is  normocephalic, atraumatic.  No labored breathing. No coughing appreciated during encounter Speech is clear and coherent with logical content.  Patient is alert and oriented at baseline.    Assessment and Plan: 1. Viral bronchitis  Based on symptoms presentation, I question a mild viral bronchitis with possible post nasal drip. Pt has only taken mucinex for a few days. I encouraged pt to stop his multisymptoms mucinex and switch to plain guaifenesin. Will add tessalon for PRN cough. To help with bronchitis, short burst of prednisone in am, montelukast in pm.   Follow Up Instructions: I discussed the assessment and treatment plan with the patient. The patient was provided an opportunity to ask questions and all were answered. The patient agreed with the plan and demonstrated an understanding of the instructions.  A copy of instructions were sent to the patient via MyChart unless otherwise noted below.   The  patient was advised to call back or seek an in-person evaluation if the symptoms worsen or if the condition fails to improve as anticipated.  Time:  I spent 10 minutes with the patient via telehealth technology discussing the above problems/concerns.    [place], PA

## 2022-03-10 ENCOUNTER — Encounter: Payer: Self-pay | Admitting: Family Medicine

## 2022-03-10 ENCOUNTER — Ambulatory Visit (INDEPENDENT_AMBULATORY_CARE_PROVIDER_SITE_OTHER): Payer: Medicare Other | Admitting: Family Medicine

## 2022-03-10 VITALS — BP 110/70 | HR 65 | Temp 98.2°F | Ht 72.0 in | Wt 182.0 lb

## 2022-03-10 DIAGNOSIS — Z7184 Encounter for health counseling related to travel: Secondary | ICD-10-CM

## 2022-03-10 DIAGNOSIS — R059 Cough, unspecified: Secondary | ICD-10-CM

## 2022-03-10 MED ORDER — HYDROCODONE BIT-HOMATROP MBR 5-1.5 MG/5ML PO SOLN: 5.0000 mL | Freq: Three times a day (TID) | ORAL | 0 refills | Status: DC | PRN

## 2022-03-10 MED ORDER — VIVOTIF PO CPDR: 1.0000 | DELAYED_RELEASE_CAPSULE | ORAL | 0 refills | Status: DC

## 2022-03-10 NOTE — Progress Notes (Unsigned)
Established Patient Office Visit  Subjective   Patient ID: Steven Byrd, male    DOB: 13-Jun-1955  Age: 67 y.o. MRN: [id number]  Chief Complaint  Patient presents with   Cough    Patient complains of cough, x3 weeks, Productive cough, Tried Prednisone, Montelukast and Bezonatate with little relief   Nasal Congestion    Patient complains of nasal congestion, x3 weeks     HPI  {History (Optional):23778} Seen with about 3-week history of cough.  Occasionally productive.  He is generally very healthy.  He is continue to exercise without difficulty.  He states during the day his symptoms are relatively stable but tend to be worse at night.  Interfering with sleep.  Denies any fevers or chills.  No pleuritic pain.  No dyspnea.  No recent GERD or postnasal drip symptoms.  He had recent telemedicine visit and was prescribed prednisone, Singulair, and Tessalon.  He is not sure any of them really helped much.  Tessalon did not suppress his cough at all.  He quit smoking in around 2000.  Other issues that he has several upcoming out of country trips.  Has questions regarding typhoid vaccine.  He would like to consider oral vaccine and has no contraindications.  Past Medical History:  Diagnosis Date   AAA (abdominal aortic aneurysm) ([facility]) 2020   3.2 cm in 2020    Atrial flutter ([facility])    Dyslipidemia    OSA on CPAP 01/20/2014   Paroxysmal atrial fibrillation [facility])    Past Surgical History:  Procedure Laterality Date   A-FLUTTER ABLATION N/A 03/22/2017   Procedure: A-FLUTTER ABLATION;  Surgeon: Thompson Grayer, MD;  Location: [facility];  Service: Cardiovascular;  Laterality: N/A;   CRANIOTOMY Right 09/04/2017   Procedure: CRANIOTOMY FOR SUBDURAL HEMATOMA;  Surgeon: Erline Levine, MD;  Location: [facility];  Service: Neurosurgery;  Laterality: Right;    reports that he quit smoking about 23 years ago. His smoking use included cigarettes. He has never used smokeless tobacco. He reports  current alcohol use of about 14.0 - 21.0 standard drinks of alcohol per week. He reports that he does not use drugs. family history includes Diabetes in his father; Heart attack in his maternal grandfather; Hypertension in his mother; Stroke in his father. Allergies  Allergen Reactions   Ativan [Lorazepam]     irritable and agitated    Review of Systems  Constitutional:  Negative for chills, fever and weight loss.  Respiratory:  Positive for cough. Negative for hemoptysis, shortness of breath and wheezing.   Cardiovascular:  Negative for chest pain.      Objective:     BP 110/70 (BP Location: Left Arm, Patient Position: Sitting, Cuff Size: Large)   Pulse 65   Temp 98.2 F (36.8 C) (Oral)   Ht 6' (1.829 m)   Wt 182 lb (82.6 kg)   SpO2 99%   BMI 24.68 kg/m  {Vitals History (Optional):23777}  Physical Exam Vitals reviewed.  Constitutional:      General: He is not in acute distress.    Appearance: Normal appearance. He is not ill-appearing.  Cardiovascular:     Rate and Rhythm: Normal rate and regular rhythm.  Pulmonary:     Effort: Pulmonary effort is normal. No respiratory distress.     Breath sounds: Normal breath sounds. No wheezing or rales.  Musculoskeletal:     Cervical back: Neck supple.  Lymphadenopathy:     Cervical: No cervical adenopathy.  Neurological:  Mental Status: He is alert.      No results found for any visits on 03/10/22.  {Labs (Optional):23779}  The 10-year ASCVD risk score (Arnett DK, et al., 2019) is: 10.1%    Assessment & Plan:   #1 Cough.  Suspect secondary to recent acute viral bronchitis.  He generally feels well.  Does not any red flags such as hemoptysis, fever, dyspnea, weight loss, etc.  Lung exam unremarkable.  No relief with Tessalon. -Wrote for limited Hycodan cough syrup 1 teaspoon nightly -Recommend chest x-ray if cough not fully resolving next couple weeks  #2 travel advice encounter-patient requesting typhoid  vaccine.  He specifically would like to consider oral vaccine.  He has no contraindications.  No immunosuppressants.  No recent oral antibiotics.  Wrote for VivotiF 1 capsule every other day for 4 doses  Carolann Littler, MD

## 2022-03-24 ENCOUNTER — Other Ambulatory Visit (HOSPITAL_BASED_OUTPATIENT_CLINIC_OR_DEPARTMENT_OTHER): Payer: Self-pay | Admitting: Family

## 2022-03-24 NOTE — Telephone Encounter (Signed)
Patient of Dr. Hilty. Please review for refill. Thank you!  

## 2022-04-04 ENCOUNTER — Encounter: Payer: Self-pay | Admitting: Adult Health

## 2022-04-06 ENCOUNTER — Other Ambulatory Visit: Payer: Self-pay | Admitting: Adult Health

## 2022-04-06 DIAGNOSIS — F5102 Adjustment insomnia: Secondary | ICD-10-CM

## 2022-04-06 DIAGNOSIS — M545 Low back pain, unspecified: Secondary | ICD-10-CM

## 2022-04-06 MED ORDER — DIAZEPAM 5 MG PO TABS: 5.0000 mg | ORAL_TABLET | Freq: Every evening | ORAL | 0 refills | Status: DC | PRN

## 2022-04-06 MED ORDER — CYCLOBENZAPRINE HCL 10 MG PO TABS: ORAL_TABLET | ORAL | 0 refills | Status: DC

## 2022-04-07 ENCOUNTER — Telehealth (HOSPITAL_BASED_OUTPATIENT_CLINIC_OR_DEPARTMENT_OTHER): Payer: Self-pay | Admitting: Family

## 2022-04-07 NOTE — Addendum Note (Signed)
Addended by: Gerald Stabs on: 04/07/2022 11:02 AM   Modules accepted: Orders

## 2022-04-07 NOTE — Telephone Encounter (Signed)
Spoke with patient regarding the Wednesday 05/17/22 1:00pm CTA chest/aorta appointment at [facility], ground floor imaging..  Arrival time is 12:30 pm for check in----liquids only 4 hours prior to study----. Patient to have lab work 1 week prior to exam at  any [employer] information to patient ahd he voiced his understanding.

## 2022-04-13 ENCOUNTER — Encounter (HOSPITAL_COMMUNITY): Payer: Self-pay | Admitting: *Deleted

## 2022-05-03 ENCOUNTER — Ambulatory Visit (INDEPENDENT_AMBULATORY_CARE_PROVIDER_SITE_OTHER): Payer: Medicare Other | Admitting: Family Medicine

## 2022-05-03 VITALS — BP 122/78 | HR 78 | Temp 98.7°F | Wt 181.0 lb

## 2022-05-03 DIAGNOSIS — R059 Cough, unspecified: Secondary | ICD-10-CM | POA: Diagnosis not present

## 2022-05-03 DIAGNOSIS — J40 Bronchitis, not specified as acute or chronic: Secondary | ICD-10-CM

## 2022-05-03 LAB — POCT INFLUENZA A/B
Influenza A, POC: NEGATIVE
Influenza B, POC: NEGATIVE

## 2022-05-03 LAB — POC COVID19 BINAXNOW: SARS Coronavirus 2 Ag: NEGATIVE

## 2022-05-03 MED ORDER — AZITHROMYCIN 250 MG PO TABS: ORAL_TABLET | ORAL | 0 refills | Status: DC

## 2022-05-03 MED ORDER — HYDROCODONE BIT-HOMATROP MBR 5-1.5 MG/5ML PO SOLN: 5.0000 mL | ORAL | 0 refills | Status: DC | PRN

## 2022-05-03 NOTE — Progress Notes (Signed)
   Subjective:    Patient ID: Steven Byrd, male    DOB: Sep 06, 1955, 67 y.o.   MRN: [id number]  HPI Here for 10 days of chest congestion and coughing up yellow sputum. No fever or SOB. He finds that sitting in the steam room at his gym helps a lot.    Review of Systems  Constitutional: Negative.   HENT: Negative.    Eyes: Negative.   Respiratory:  Positive for cough. Negative for shortness of breath and wheezing.        Objective:   Physical Exam Constitutional:      Appearance: Normal appearance.  HENT:     Right Ear: Tympanic membrane, ear canal and external ear normal.     Left Ear: Tympanic membrane, ear canal and external ear normal.     Nose: Nose normal.     Mouth/Throat:     Pharynx: Oropharynx is clear.  Eyes:     Conjunctiva/sclera: Conjunctivae normal.  Pulmonary:     Effort: Pulmonary effort is normal.     Breath sounds: Normal breath sounds.  Lymphadenopathy:     Cervical: No cervical adenopathy.  Neurological:     Mental Status: He is alert.           Assessment & Plan:  Bronchitis, treat with a Zpack.  Alysia Penna, MD

## 2022-05-10 DIAGNOSIS — I77819 Aortic ectasia, unspecified site: Secondary | ICD-10-CM | POA: Diagnosis not present

## 2022-05-11 LAB — BASIC METABOLIC PANEL
BUN/Creatinine Ratio: 21 (ref 10–24)
BUN: 21 mg/dL (ref 8–27)
CO2: 21 mmol/L (ref 20–29)
Calcium: 9.2 mg/dL (ref 8.6–10.2)
Chloride: 101 mmol/L (ref 96–106)
Creatinine, Ser: 1 mg/dL (ref 0.76–1.27)
Glucose: 94 mg/dL (ref 70–99)
Potassium: 5 mmol/L (ref 3.5–5.2)
Sodium: 140 mmol/L (ref 134–144)
eGFR: 82 mL/min/{1.73_m2} (ref >59)

## 2022-05-17 ENCOUNTER — Ambulatory Visit (HOSPITAL_BASED_OUTPATIENT_CLINIC_OR_DEPARTMENT_OTHER)
Admission: RE | Admit: 2022-05-17 | Discharge: 2022-05-17 | Disposition: A | Discharge status: Home / Self Care | Payer: Medicare Other | Source: Ambulatory Visit | Attending: Family | Admitting: Family

## 2022-05-17 ENCOUNTER — Encounter (HOSPITAL_BASED_OUTPATIENT_CLINIC_OR_DEPARTMENT_OTHER): Payer: Self-pay

## 2022-05-17 DIAGNOSIS — I471 Supraventricular tachycardia, unspecified: Secondary | ICD-10-CM | POA: Diagnosis not present

## 2022-05-17 DIAGNOSIS — E785 Hyperlipidemia, unspecified: Secondary | ICD-10-CM | POA: Insufficient documentation

## 2022-05-17 DIAGNOSIS — G4733 Obstructive sleep apnea (adult) (pediatric): Secondary | ICD-10-CM | POA: Diagnosis not present

## 2022-05-17 DIAGNOSIS — I712 Thoracic aortic aneurysm, without rupture, unspecified: Secondary | ICD-10-CM | POA: Diagnosis not present

## 2022-05-17 DIAGNOSIS — I25118 Atherosclerotic heart disease of native coronary artery with other forms of angina pectoris: Secondary | ICD-10-CM | POA: Insufficient documentation

## 2022-05-17 MED ORDER — IOHEXOL 350 MG/ML SOLN
100.0000 mL | Freq: Once | INTRAVENOUS | Status: AC | PRN
  Administered 2022-05-17: 75 mL via INTRAVENOUS

## 2022-05-18 ENCOUNTER — Other Ambulatory Visit (HOSPITAL_BASED_OUTPATIENT_CLINIC_OR_DEPARTMENT_OTHER): Payer: Self-pay

## 2022-05-18 DIAGNOSIS — I25118 Atherosclerotic heart disease of native coronary artery with other forms of angina pectoris: Secondary | ICD-10-CM

## 2022-05-18 DIAGNOSIS — I2510 Atherosclerotic heart disease of native coronary artery without angina pectoris: Secondary | ICD-10-CM

## 2022-05-18 DIAGNOSIS — I77819 Aortic ectasia, unspecified site: Secondary | ICD-10-CM

## 2022-07-10 ENCOUNTER — Telehealth: Payer: Self-pay | Admitting: Adult Health

## 2022-07-10 NOTE — Telephone Encounter (Signed)
Contacted Steven Byrd to schedule their annual wellness visit. Appointment made for 07/25/22.  Steven Byrd AWV direct phone # [phone]   Schedule chagne moved to [employer] schedule

## 2022-07-13 DIAGNOSIS — Z23 Encounter for immunization: Secondary | ICD-10-CM | POA: Diagnosis not present

## 2022-07-17 DIAGNOSIS — H401131 Primary open-angle glaucoma, bilateral, mild stage: Secondary | ICD-10-CM | POA: Diagnosis not present

## 2022-07-25 ENCOUNTER — Encounter: Payer: Medicare Other | Admitting: Family Medicine

## 2022-08-02 DIAGNOSIS — H401121 Primary open-angle glaucoma, left eye, mild stage: Secondary | ICD-10-CM | POA: Diagnosis not present

## 2022-08-10 ENCOUNTER — Ambulatory Visit (INDEPENDENT_AMBULATORY_CARE_PROVIDER_SITE_OTHER): Payer: Medicare Other | Admitting: Family Medicine

## 2022-08-10 VITALS — Wt 182.0 lb

## 2022-08-10 DIAGNOSIS — Z0000 Encounter for general adult medical examination without abnormal findings: Secondary | ICD-10-CM

## 2022-08-10 NOTE — Progress Notes (Signed)
PATIENT CHECK-IN and HEALTH RISK ASSESSMENT QUESTIONNAIRE:  -completed by phone/video for upcoming Medicare Preventive Visit  Pre-Visit Check-in: 1)Vitals (height, wt, BP, etc) - record in vitals section for visit on day of visit 2)Review and Update Medications, Allergies PMH, Surgeries, Social history in Epic 3)Hospitalizations in the last year with date/reason? No  4)Review and Update Care Team (patient's specialists) in Epic 5) Complete PHQ9 in Epic  6) Complete Fall Screening in Epic 7)Review all Health Maintenance Due and order under PCP if not done.  Medicare Wellness Patient Questionnaire:  Answer theses question about your habits: Do you drink alcohol? yes If yes, how many drinks do you have a day?2-4 drinks. Drinks 4-5 days a week. Reports does not struggle to cut back.  Have you ever smoked?yes  Quit date if applicable? About 20-25 years ago.   How many packs a day do/did you smoke? Less than 1 pk a day Do you use smokeless tobacco?no Do you use an illicit drugs?no  Do you exercises? Yes IF so, what type and how many days/minutes per week?5-6 days of the week. walk, play golf, go to gym for an hour or an hour and half. Cardio and strength training, walking.  Are you sexually active? Yes Number of partners?1 Typical breakfast: egg white. Bell Pepper/jalapeno. Avocado. Sometimes cereal and fruits. spinach Typical lunch: "usually don't eat lunch" Typical dinner: course of salad, fish or chicken. Typical snacks: bowl of cereal, cottage cheese  Beverages: power aid 0, water, Alcohol(wine , beer, Bourbon )  Answer theses question about you: Can you perform most household chores? Yes but iron clothing.  Do you find it hard to follow a conversation in a noisy room?No Do you often ask people to speak up or repeat themselves?No Do you feel that you have a problem with memory? 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 Had a lot of concussion." Do you balance your  checkbook and or bank acounts?Yes Do you feel safe at home?Yes Last dentist visit?got to [facility]ule for June 6. Try to go every 3 months. With [facility].  Do you need assistance with any of the following: Please note if so No  Driving?  Feeding yourself?  Getting from bed to chair?  Getting to the toilet?  Bathing or showering?  Dressing yourself?  Managing money?  Climbing a flight of stairs  Preparing meals?    Do you have Advanced Directives in place (Living Will, Healthcare Power or Attorney)? Yes, in the process to update it.    Last eye Exam and location?[facility]. About a week ago. Has glaucoma. Go to eye doctor every 6 months.    Do you currently use prescribed or non-prescribed narcotic or opioid pain medications?no  Do you have a history or close family history of breast, ovarian, tubal or peritoneal cancer or a family member with BRCA (breast cancer susceptibility 1 and 2) gene mutations? no  Nurse/Assistant Credentials/time stamp: Karpuih M./CMA/ 9:50am   ----------------------------------------------------------------------------------------------------------------------------------------------------------------------------------------------------------------------    MEDICARE ANNUAL PREVENTIVE CARE VISIT WITH PROVIDER (Welcome to [employer], initial annual wellness or annual wellness exam)  Virtual Visit via Phone Note  I connected with Steven Byrd on 08/10/22  by phone  and verified that I am speaking with the correct person using two identifiers.  Location patient: home Location provider:work or home office Persons participating in the virtual visit: patient, provider  Concerns and/or follow up today: none, stable.   See HM section in Epic for other details  of completed HM.    ROS: negative for report of fevers, unintentional weight loss, vision changes, vision loss, hearing loss or change, chest pain, sob, hemoptysis, melena,  hematochezia, hematuria, falls, bleeding or bruising, thoughts of suicide or self harm, memory loss  Patient-completed extensive health risk assessment - reviewed and discussed with the patient: See Health Risk Assessment completed with patient prior to the visit either above or in recent phone note. This was reviewed in detailed with the patient today and appropriate recommendations, orders and referrals were placed as needed per Summary below and patient instructions.   Review of Medical History: -PMH, PSH, Family History and current specialty and care providers reviewed and updated and listed below   Patient Care Team: Shirline Frees, NP as PCP - General (Family Medicine) Rennis Golden Lisette Abu, MD as PCP - Cardiology (Cardiology) Van Clines, MD as Consulting Physician (Neurology)   Past Medical History:  Diagnosis Date   AAA (abdominal aortic aneurysm) (HCC) 2020   3.2 cm in 2020    Atrial flutter (HCC)    Dyslipidemia    OSA on CPAP 01/20/2014   Paroxysmal atrial fibrillation (HCC)     Past Surgical History:  Procedure Laterality Date   A-FLUTTER ABLATION N/A 03/22/2017   Procedure: A-FLUTTER ABLATION;  Surgeon: Hillis Range, MD;  Location: [facility] INV[facility];  Service: Cardiovascular;  Laterality: N/A;   CRANIOTOMY Right 09/04/2017   Procedure: CRANIOTOMY FOR SUBDURAL HEMATOMA;  Surgeon: Maeola Harman, MD;  Location: [facility] OR;  Service: Neurosurgery;  Laterality: Right;    Social History   Socioeconomic History   Marital status: Significant Other    Spouse name: Not on file   Number of children: Not on file   Years of education: Not on file   Highest education level: Bachelor's degree (e.g., BA, AB, BS)  Occupational History   Not on file  Tobacco Use   Smoking status: Former    Types: Cigarettes    Quit date: 10/16/1998    Years since quitting: 23.8   Smokeless tobacco: Never  Vaping Use   Vaping Use: Never used  Substance and Sexual Activity   Alcohol use: Yes     Alcohol/week: 14.0 - 21.0 standard drinks of alcohol    Types: 14 - 21 Glasses of wine per week    Comment: red wine   Drug use: No   Sexual activity: Yes    Partners: Female  Other Topics Concern   Not on file  Social History Narrative   Has a Administrator, 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       He likes to hike, scuba dive, and travel      Lives in single story home    Dondra Spry - significant other   4 year degree   Right handed    Social Determinants of Health   Financial Resource Strain: Low Risk  (05/03/2022)   Overall Financial Resource Strain (CARDIA)    Difficulty of Paying Living Expenses: Not hard at all  Food Insecurity: No Food Insecurity (05/03/2022)   Hunger Vital Sign    Worried About Running Out of Food in the Last Year: Never true    Ran Out of Food in the Last Year: Never true  Transportation Needs: No Transportation Needs (05/03/2022)   PRAPARE - Administrator, Civil Service (Medical): No  Lack of Transportation (Non-Medical): No  Physical Activity: Sufficiently Active (05/03/2022)   Exercise Vital Sign    Days of Exercise per Week: 6 days    Minutes of Exercise per Session: 50 min  Stress: No Stress Concern Present (05/03/2022)   [employer] of Occupational Health - Occupational Stress Questionnaire    Feeling of Stress : Only a little  Social Connections: Socially Integrated (05/03/2022)   Social Connection and Isolation Panel [NHANES]    Frequency of Communication with Friends and Family: More than three times a week    Frequency of Social Gatherings with Friends and Family: More than three times a week    Attends Religious Services: More than 4 times per year    Active Member of [employer] or Organizations: Yes    Attends Banker Meetings: 1 to 4 times per year    Marital Status: Living with partner  Intimate Partner Violence: Not At Risk (07/22/2021)    Humiliation, Afraid, Rape, and Kick questionnaire    Fear of Current or Ex-Partner: No    Emotionally Abused: No    Physically Abused: No    Sexually Abused: No    Family History  Problem Relation Age of Onset   Diabetes Father    Stroke Father    Hypertension Mother        ?   Heart attack Maternal Grandfather    Colon cancer Neg Hx     Current Outpatient Medications on File Prior to Visit  Medication Sig Dispense Refill   acyclovir (ZOVIRAX) 800 MG tablet Take 1 tablet (800 mg total) by mouth 3 (three) times daily as needed. 30 tablet 3   aspirin EC 81 MG tablet Take 1 tablet (81 mg total) by mouth daily. Swallow whole. 90 tablet 3   Bacillus Coagulans-Inulin (PROBIOTIC) 1-250 BILLION-MG CAPS as directed Orally     CALCIUM-MAGNESIUM-ZINC PO Take 1 capsule by mouth daily.     cholecalciferol (VITAMIN D) 1000 units tablet Take 1,000 Units by mouth daily.     Coenzyme Q10 (CO Q 10 PO) Take 1 capsule by mouth daily.     [employer] SUNSCREEN SPF 30 EX apply topically to face and body daily for 30     cyclobenzaprine (FLEXERIL) 10 MG tablet TAKE ONE TABLET BY MOUTH THREE TIMES A DAY AS NEEDED FOR MUSCLE SPASMS 15 tablet 0   diazepam (VALIUM) 5 MG tablet Take 1 tablet (5 mg total) by mouth at bedtime as needed for anxiety. 15 tablet 0   diltiazem (CARDIZEM) 30 MG tablet TAKE 1 TABLET BY MOUTH EVERY 4 HOURS AS NEEDED FOR HEART RATE >100 30 tablet 2   ezetimibe (ZETIA) 10 MG tablet TAKE ONE TABLET BY MOUTH DAILY 30 tablet 11   Multiple Vitamin (MULTIVITAMIN) capsule Take 1 capsule by mouth daily.     rosuvastatin (CRESTOR) 40 MG tablet TAKE ONE TABLET BY MOUTH DAILY 90 tablet 2   azithromycin (ZITHROMAX Z-PAK) 250 MG tablet As directed (Patient not taking: Reported on 08/10/2022) 6 each 0   nitroGLYCERIN (NITROSTAT) 0.4 MG SL tablet Place 1 tablet (0.4 mg total) under the tongue every 5 (five) minutes as needed for chest pain (If you take 3 tablets without resolution of chest pain, proceed to ER  for evaluation.). 30 tablet 3   typhoid (VIVOTIF) DR capsule Take 1 capsule by mouth every other day. (Patient not taking: Reported on 08/10/2022) 4 capsule 0   No current facility-administered medications on file prior to visit.  Allergies  Allergen Reactions   Ativan [Lorazepam]     irritable and agitated       Physical Exam Vitals:   Estimated body mass index is 24.68 kg/m as calculated from the following:   Height as of 03/10/22: 6' (1.829 m).   Weight as of this encounter: 182 lb (82.6 kg).  EKG (optional): deferred due to virtual visit  GENERAL: alert, oriented, no acute distress detected; full vision exam deferred due to pandemic and/or virtual encounter  PSYCH/NEURO: pleasant and cooperative, no obvious depression or anxiety, speech and thought processing grossly intact, Cognitive function grossly intact  Flowsheet Row Office Visit from 05/03/2022 in [facility] [place]  PHQ-9 Total Score 2           08/10/2022   10:11 AM 05/03/2022    3:41 PM 07/22/2021    3:24 PM 07/21/2020   11:15 AM 07/26/2017    2:32 PM  Depression screen PHQ 2/9  Decreased Interest 0 0 0 0 0  Down, Depressed, Hopeless 0 0 0 0 0  PHQ - 2 Score 0 0 0 0 0  Altered sleeping  1     Tired, decreased energy  1     Change in appetite  0     Feeling bad or failure about yourself   0     Trouble concentrating  0     Moving slowly or fidgety/restless  0     Suicidal thoughts  0     PHQ-9 Score  2     Difficult doing work/chores  Not difficult at all          07/22/2021    3:28 PM 05/03/2022   11:00 AM 05/03/2022    3:41 PM 08/09/2022   12:49 PM 08/10/2022    9:38 AM  Fall Risk  Falls in the past year? 0 0 Exclusion - non ambulatory 1 1  Was there an injury with Fall? 0  0 1 0  Fall Risk Category Calculator 0  0 2 1  Fall Risk Category (Retired) Low      (RETIRED) Patient Fall Risk Level Low fall risk      Patient at Risk for Falls Due to No Fall Risks  No Fall Risks   Other (Comment)  Patient at Risk for Falls Due to - Comments     pt reports he fell from a ladder.  Fall risk Follow up   Falls evaluation completed  Falls evaluation completed     SUMMARY AND PLAN:  Encounter for Medicare annual wellness exam   Discussed applicable health maintenance/preventive health measures and advised and referred or ordered per patient preferences: -he thinks he had his pneumonia shot earlier this year and will check and agrees to update [place] -seeing cards for lipids/CAD - on statin, LP(a) monitored, discussed dean ornish program - lifestyle changes for reduction of CAD symptoms/events -PSA done with PCP in october Health Maintenance  Topic Date Due   Pneumonia Vaccine 32+ Years old (2 of 2 - PPSV23 or PCV20) 11/06/2019   INFLUENZA VACCINE  10/05/2022   Medicare Annual Wellness (AWV)  08/10/2023   Colonoscopy  11/13/2023   DTaP/Tdap/Td (4 - Td or Tdap) 11/24/2029   Hepatitis C Screening  Completed   Zoster Vaccines- Shingrix  Completed   HPV VACCINES  Aged Out   COVID-19 Vaccine  Discontinued    Education and counseling on the following was provided based on the above review of health and a  plan/checklist for the patient, along with additional information discussed, was provided for the patient in the patient instructions :  -requested copy of advanced directives for PCP -Advised and counseled on a healthy lifestyle - including the importance of a healthy diet, regular physical activity, social connections and stress management. -Reviewed patient's current diet. Advised and counseled on a whole foods based healthy diet. A summary of a healthy diet was provided in the Patient Instructions.  -reviewed patient's current physical activity level and discussed exercise guidelines for adults. Discussed community resources and ideas for safe exercise at home to assist in meeting exercise guideline recommendations in a safe and healthy way.  -reports low stress and good  social connections - travels a lot and plays goal with buddies -Advise yearly dental visits at minimum and regular eye exams -Advised and counseled on alcohol safe limits, risks - advised to cut back on alcohol to no more than 2 or less drinks per given 24 hour period. He denies any difficulty with cutting back in the past and reports took month off earlier this year. He feels he is able to cut back and is motivated to do so.   Follow up: see patient instructions   Patient Instructions  I really enjoyed getting to talk with you today! I am available on Tuesdays and Thursdays for virtual visits if you have any questions or concerns, or if I can be of any further assistance.   CHECKLIST FROM ANNUAL WELLNESS VISIT:  -Follow up (please call to schedule if not scheduled after visit):   -yearly for annual wellness visit with primary care office  Here is a list of your preventive care/health maintenance measures and the plan for each if any are due:  PLAN For any measures below that may be due:  -please bring [place] a copy of your pneumonia administration record and we will update it in your  chart.  Thanks! Health Maintenance  Topic Date Due   Pneumonia Vaccine 58+ Years old (2 of 2 - PPSV23 or PCV20) 11/06/2019   INFLUENZA VACCINE  10/05/2022   Medicare Annual Wellness (AWV)  08/10/2023   Colonoscopy  11/13/2023   DTaP/Tdap/Td (4 - Td or Tdap) 11/24/2029   Hepatitis C Screening  Completed   Zoster Vaccines- Shingrix  Completed   HPV VACCINES  Aged Out   COVID-19 Vaccine  Discontinued    -See a dentist at least yearly  -Get your eyes checked and then per your eye specialist's recommendations  -Other issues addressed today:  -please cut back on alcohol use - no more than 2 drink in any given 24 hour period, less would be better  -I have included below further information regarding a healthy whole foods based diet, physical activity guidelines for adults, stress management and  opportunities for social connections. I hope you find this information useful.   -----------------------------------------------------------------------------------------------------------------------------------------------------------------------------------------------------------------------------------------------------------  NUTRITION: -eat real food: lots of colorful vegetables (half the plate) and fruits -5-7 servings of vegetables and fruits per day (fresh or steamed is best), exp. 2 servings of vegetables with lunch and dinner and 2 servings of fruit per day. Berries and greens such as kale and collards are great choices.  -consume on a regular basis: whole grains (make sure first ingredient on label contains the word "whole"), fresh fruits, fish, nuts, seeds, healthy oils (such as olive oil, avocado oil, grape seed oil) -may eat small amounts of dairy and lean meat on occasion, but avoid processed meats such as ham, bacon, lunch meat,  etc. -drink water -try to avoid fast food and pre-packaged foods, processed meat -most experts advise limiting sodium to < 2300mg  per day, should limit further is any chronic conditions such as high blood pressure, heart disease, diabetes, etc. The American Heart Association advised that < 1500mg  is is ideal -try to avoid foods that contain any ingredients with names you do not recognize  -try to avoid sugar/sweets (except for the natural sugar that occurs in fresh fruit) -try to avoid sweet drinks -try to avoid white rice, white bread, pasta (unless whole grain), white or yellow potatoes  EXERCISE GUIDELINES FOR ADULTS: -if you wish to increase your physical activity, do so gradually and with the approval of your doctor -STOP and seek medical care immediately if you have any chest pain, chest discomfort or trouble breathing when starting or increasing exercise  -move and stretch your body, legs, feet and arms when sitting for long periods -Physical  activity guidelines for optimal health in adults: -least 150 minutes per week of aerobic exercise (can talk, but not sing) once approved by your doctor, 20-30 minutes of sustained activity or two 10 minute episodes of sustained activity every day.  -resistance training at least 2 days per week if approved by your doctor -balance exercises 3+ days per week:   Stand somewhere where you have something sturdy to hold onto if you lose balance.    1) lift up on toes, start with 5x per day and work up to 20x   2) stand and lift on leg straight out to the side so that foot is a few inches of the floor, start with 5x each side and work up to 20x each side   3) stand on one foot, start with 5 seconds each side and work up to 20 seconds on each side   STRESS MANAGEMENT: -can try meditating, or just sitting quietly with deep breathing while intentionally relaxing all parts of your body for 5 minutes daily -if you need further help with stress, anxiety or depression please follow up with your primary doctor or contact the wonderful folks at [facility]: [phone]           Terressa Koyanagi, DO

## 2022-08-10 NOTE — Patient Instructions (Addendum)
I really enjoyed getting to talk with you today! I am available on Tuesdays and Thursdays for virtual visits if you have any questions or concerns, or if I can be of any further assistance.   CHECKLIST FROM ANNUAL WELLNESS VISIT:  -Follow up (please call to schedule if not scheduled after visit):   -yearly for annual wellness visit with primary care office  Here is a list of your preventive care/health maintenance measures and the plan for each if any are due:  PLAN For any measures below that may be due:  -please bring [place] a copy of your pneumonia administration record and we will update it in your [place] chart.  Thanks! Health Maintenance  Topic Date Due   Pneumonia Vaccine 46+ Years old (2 of 2 - PPSV23 or PCV20) 11/06/2019   INFLUENZA VACCINE  10/05/2022   Medicare Annual Wellness (AWV)  08/10/2023   Colonoscopy  11/13/2023   DTaP/Tdap/Td (4 - Td or Tdap) 11/24/2029   Hepatitis C Screening  Completed   Zoster Vaccines- Shingrix  Completed   HPV VACCINES  Aged Out   COVID-19 Vaccine  Discontinued    -See a dentist at least yearly  -Get your eyes checked and then per your eye specialist's recommendations  -Other issues addressed today:  -please cut back on alcohol use - no more than 2 drink in any given 24 hour period, less would be better  -I have included below further information regarding a healthy whole foods based diet, physical activity guidelines for adults, stress management and opportunities for social connections. I hope you find this information useful.   -----------------------------------------------------------------------------------------------------------------------------------------------------------------------------------------------------------------------------------------------------------  NUTRITION: -eat real food: lots of colorful vegetables (half the plate) and fruits -5-7 servings of vegetables and fruits per day (fresh or steamed is best),  exp. 2 servings of vegetables with lunch and dinner and 2 servings of fruit per day. Berries and greens such as kale and collards are great choices.  -consume on a regular basis: whole grains (make sure first ingredient on label contains the word "whole"), fresh fruits, fish, nuts, seeds, healthy oils (such as olive oil, avocado oil, grape seed oil) -may eat small amounts of dairy and lean meat on occasion, but avoid processed meats such as ham, bacon, lunch meat, etc. -drink water -try to avoid fast food and pre-packaged foods, processed meat -most experts advise limiting sodium to < 2300mg  per day, should limit further is any chronic conditions such as high blood pressure, heart disease, diabetes, etc. The American Heart Association advised that < 1500mg  is is ideal -try to avoid foods that contain any ingredients with names you do not recognize  -try to avoid sugar/sweets (except for the natural sugar that occurs in fresh fruit) -try to avoid sweet drinks -try to avoid white rice, white bread, pasta (unless whole grain), white or yellow potatoes  EXERCISE GUIDELINES FOR ADULTS: -if you wish to increase your physical activity, do so gradually and with the approval of your doctor -STOP and seek medical care immediately if you have any chest pain, chest discomfort or trouble breathing when starting or increasing exercise  -move and stretch your body, legs, feet and arms when sitting for long periods -Physical activity guidelines for optimal health in adults: -least 150 minutes per week of aerobic exercise (can talk, but not sing) once approved by your doctor, 20-30 minutes of sustained activity or two 10 minute episodes of sustained activity every day.  -resistance training at least 2 days per week if approved by  your doctor -balance exercises 3+ days per week:   Stand somewhere where you have something sturdy to hold onto if you lose balance.    1) lift up on toes, start with 5x per day and work  up to 20x   2) stand and lift on leg straight out to the side so that foot is a few inches of the floor, start with 5x each side and work up to 20x each side   3) stand on one foot, start with 5 seconds each side and work up to 20 seconds on each side   STRESS MANAGEMENT: -can try meditating, or just sitting quietly with deep breathing while intentionally relaxing all parts of your body for 5 minutes daily -if you need further help with stress, anxiety or depression please follow up with your primary doctor or contact the wonderful folks at [facility]: [phone]

## 2022-08-21 DIAGNOSIS — H401131 Primary open-angle glaucoma, bilateral, mild stage: Secondary | ICD-10-CM | POA: Diagnosis not present

## 2022-10-16 ENCOUNTER — Other Ambulatory Visit: Payer: Self-pay | Admitting: *Deleted

## 2022-10-16 DIAGNOSIS — E7849 Other hyperlipidemia: Secondary | ICD-10-CM

## 2022-11-03 ENCOUNTER — Other Ambulatory Visit (HOSPITAL_BASED_OUTPATIENT_CLINIC_OR_DEPARTMENT_OTHER): Payer: Self-pay | Admitting: Family

## 2022-11-03 DIAGNOSIS — E7849 Other hyperlipidemia: Secondary | ICD-10-CM

## 2022-11-03 DIAGNOSIS — E785 Hyperlipidemia, unspecified: Secondary | ICD-10-CM

## 2022-11-03 DIAGNOSIS — I2510 Atherosclerotic heart disease of native coronary artery without angina pectoris: Secondary | ICD-10-CM

## 2022-11-03 NOTE — Telephone Encounter (Signed)
Rx request sent to pharmacy.  

## 2022-11-29 ENCOUNTER — Encounter: Payer: Self-pay | Admitting: Adult Health

## 2022-11-29 ENCOUNTER — Ambulatory Visit (INDEPENDENT_AMBULATORY_CARE_PROVIDER_SITE_OTHER): Payer: Medicare Other | Admitting: Adult Health

## 2022-11-29 ENCOUNTER — Other Ambulatory Visit (INDEPENDENT_AMBULATORY_CARE_PROVIDER_SITE_OTHER): Payer: Medicare Other

## 2022-11-29 VITALS — BP 110/72 | HR 65 | Temp 98.0°F | Ht 72.0 in | Wt 183.0 lb

## 2022-11-29 DIAGNOSIS — G4733 Obstructive sleep apnea (adult) (pediatric): Secondary | ICD-10-CM | POA: Diagnosis not present

## 2022-11-29 DIAGNOSIS — Z23 Encounter for immunization: Secondary | ICD-10-CM | POA: Diagnosis not present

## 2022-11-29 DIAGNOSIS — N400 Enlarged prostate without lower urinary tract symptoms: Secondary | ICD-10-CM

## 2022-11-29 DIAGNOSIS — I483 Typical atrial flutter: Secondary | ICD-10-CM

## 2022-11-29 DIAGNOSIS — F5102 Adjustment insomnia: Secondary | ICD-10-CM | POA: Diagnosis not present

## 2022-11-29 DIAGNOSIS — E785 Hyperlipidemia, unspecified: Secondary | ICD-10-CM

## 2022-11-29 DIAGNOSIS — B009 Herpesviral infection, unspecified: Secondary | ICD-10-CM

## 2022-11-29 LAB — LIPID PANEL
Cholesterol: 118 mg/dL (ref 0–200)
HDL: 58.1 mg/dL (ref >39.00)
LDL Cholesterol: 47 mg/dL (ref 0–99)
NonHDL: 59.86
Total CHOL/HDL Ratio: 2
Triglycerides: 66 mg/dL (ref 0.0–149.0)
VLDL: 13.2 mg/dL (ref 0.0–40.0)

## 2022-11-29 LAB — COMPREHENSIVE METABOLIC PANEL
ALT: 27 U/L (ref 0–53)
AST: 29 U/L (ref 0–37)
Albumin: 4.3 g/dL (ref 3.5–5.2)
Alkaline Phosphatase: 66 U/L (ref 39–117)
BUN: 13 mg/dL (ref 6–23)
CO2: 26 mEq/L (ref 19–32)
Calcium: 9.4 mg/dL (ref 8.4–10.5)
Chloride: 101 mEq/L (ref 96–112)
Creatinine, Ser: 0.87 mg/dL (ref 0.40–1.50)
GFR: 88.16 mL/min (ref >60.00)
Glucose, Bld: 91 mg/dL (ref 70–99)
Potassium: 4.1 mEq/L (ref 3.5–5.1)
Sodium: 137 mEq/L (ref 135–145)
Total Bilirubin: 2.3 mg/dL — ABNORMAL HIGH (ref 0.2–1.2)
Total Protein: 7.3 g/dL (ref 6.0–8.3)

## 2022-11-29 LAB — TSH: TSH: 2.07 u[IU]/mL (ref 0.35–5.50)

## 2022-11-29 LAB — CBC
HCT: 41.4 % (ref 39.0–52.0)
Hemoglobin: 13.5 g/dL (ref 13.0–17.0)
MCHC: 32.6 g/dL (ref 30.0–36.0)
MCV: 101 fl — ABNORMAL HIGH (ref 78.0–100.0)
Platelets: 197 10*3/uL (ref 150.0–400.0)
RBC: 4.1 Mil/uL — ABNORMAL LOW (ref 4.22–5.81)
RDW: 13.3 % (ref 11.5–15.5)
WBC: 5 10*3/uL (ref 4.0–10.5)

## 2022-11-29 LAB — PSA: PSA: 0.88 ng/mL (ref 0.10–4.00)

## 2022-11-29 MED ORDER — DIAZEPAM 5 MG PO TABS: 5.0000 mg | ORAL_TABLET | Freq: Every evening | ORAL | 0 refills | Status: DC | PRN

## 2022-11-29 NOTE — Progress Notes (Signed)
Subjective:    Patient ID: Steven Byrd, male    DOB: 1955-10-14, 67 y.o.   MRN: [id number]  HPI Patient presents for yearly follow up exam. He is a pleasant 67 year old male who  has a past medical history of AAA (abdominal aortic aneurysm) (HCC) (2020), Atrial flutter (HCC), Dyslipidemia, OSA on CPAP (01/20/2014), and Paroxysmal atrial fibrillation (HCC).  He is retired and is doing a lot of international travel.   Familial dyslipidemia - - managed with Crestor 40 mg daily and Zetia 10 mg daily.[name] He denies myalgia or fatigue.  He is seen by cardiology.  His CT calcium score was 1461 which put him in the 93rd percentile for age and sex, however had minimal nonobstructive coronary artery disease in February 2023.  Lab Results  Component Value Date   CHOL 155 12/21/2021   HDL 55.50 12/21/2021   LDLCALC 79 12/21/2021   LDLDIRECT 126.8 02/01/2010   TRIG 102.0 12/21/2021   CHOLHDL 3 12/21/2021    OSA-has not used CPAP routinely as of recent.   Denies daytime somnolence, waking up feeling fatigue, or the need for naps  History of atrial flutter-status post ablation in 2019.  He was seen in the emergency room in August 2022 after noticing that his heart rate was 1 60-1 70s via his Apple Watch.  On going to the emergency room he was back into normal sinus rhythm.  He followed up with cardiology later that month and was prescribed a prescription for Cardizem 30 mg as needed if he has recurrent symptoms. 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  Insomnia - takes valium periodically.   BPH - has recently started to have nocturia with 1-2 times a night.   Glaucoma - being treated with by Venita Lick  All immunizations and health maintenance protocols were reviewed with the patient and needed orders  were placed.  Appropriate screening laboratory values were ordered for the patient including screening of hyperlipidemia, renal function and hepatic function. If indicated by BPH, a PSA was ordered.  Medication reconciliation,  past medical history, social history, problem list and allergies were reviewed in detail with the patient  Goals were established with regard to weight loss, exercise, and  diet in compliance with medications. He continues to go to the gym and hikes on a routine basis.  Wt Readings from Last 3 Encounters:  11/29/22 183 lb (83 kg)  08/10/22 182 lb (82.6 kg)  05/03/22 181 lb (82.1 kg)    Review of Systems  Constitutional: Negative.   HENT: Negative.    Eyes: Negative.   Respiratory: Negative.    Cardiovascular: Negative.   Gastrointestinal: Negative.   Endocrine: Negative.   Genitourinary: Negative.   Musculoskeletal: Negative.   Skin: Negative.   Allergic/Immunologic: Negative.   Neurological: Negative.   Hematological: Negative.   Psychiatric/Behavioral: Negative.    All other systems reviewed and are negative.  Past Medical History:  Diagnosis Date   AAA (abdominal aortic aneurysm) (HCC) 2020   3.2 cm in 2020    Atrial flutter (HCC)    Dyslipidemia    OSA on CPAP 01/20/2014   Paroxysmal atrial fibrillation (HCC)     Social History   Socioeconomic History   Marital status: Significant Other    Spouse name: Not on file  Number of children: Not on file   Years of education: Not on file   Highest education level: Bachelor's degree (e.g., BA, AB, BS)  Occupational History   Not on file  Tobacco Use   Smoking status: Former    Current packs/day: 0.00    Types: Cigarettes    Quit date: 10/16/1998    Years since quitting: 24.1   Smokeless tobacco: Never  Vaping Use   Vaping status: Never Used  Substance and Sexual Activity   Alcohol use: Yes    Alcohol/week: 14.0 - 21.0 standard drinks of alcohol    Types: 14 - 21 Glasses of wine per  week    Comment: red wine   Drug use: No   Sexual activity: Yes    Partners: Female  Other Topics Concern   Not on file  Social History Narrative   Has a Administrator, 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       He likes to hike, scuba dive, and travel      Lives in single story home    Dondra Spry - significant other   4 year degree   Right handed    Social Determinants of Health   Financial Resource Strain: Low Risk  (05/03/2022)   Overall Financial Resource Strain (CARDIA)    Difficulty of Paying Living Expenses: Not hard at all  Food Insecurity: No Food Insecurity (05/03/2022)   Hunger Vital Sign    Worried About Running Out of Food in the Last Year: Never true    Ran Out of Food in the Last Year: Never true  Transportation Needs: No Transportation Needs (05/03/2022)   PRAPARE - Administrator, Civil Service (Medical): No    Lack of Transportation (Non-Medical): No  Physical Activity: Sufficiently Active (05/03/2022)   Exercise Vital Sign    Days of Exercise per Week: 6 days    Minutes of Exercise per Session: 50 min  Stress: No Stress Concern Present (05/03/2022)   [employer] of Occupational Health - Occupational Stress Questionnaire    Feeling of Stress : Only a little  Social Connections: Socially Integrated (05/03/2022)   Social Connection and Isolation Panel [NHANES]    Frequency of Communication with Friends and Family: More than three times a week    Frequency of Social Gatherings with Friends and Family: More than three times a week    Attends Religious Services: More than 4 times per year    Active Member of Clubs or Organizations: Yes    Attends Banker Meetings: 1 to 4 times per year    Marital Status: Living with partner  Intimate Partner Violence: Not At Risk (07/22/2021)   Humiliation, Afraid, Rape, and Kick questionnaire    Fear of Current or Ex-Partner: No     Emotionally Abused: No    Physically Abused: No    Sexually Abused: No    Past Surgical History:  Procedure Laterality Date   A-FLUTTER ABLATION N/A 03/22/2017   Procedure: A-FLUTTER ABLATION;  Surgeon: Hillis Range, MD;  [facility];  Service: Cardiovascular;  Laterality: N/A;   CRANIOTOMY Right 09/04/2017   Procedure: CRANIOTOMY FOR SUBDURAL HEMATOMA;  Surgeon: Maeola Harman, MD;  Location: [facility] OR;  Service: Neurosurgery;  Laterality: Right;    Family History  Problem Relation Age of Onset   Diabetes Father  Stroke Father    Hypertension Mother        ?   Heart attack Maternal Grandfather    Colon cancer Neg Hx     Allergies  Allergen Reactions   Ativan [Lorazepam]     irritable and agitated    Current Outpatient Medications on File Prior to Visit  Medication Sig Dispense Refill   acyclovir (ZOVIRAX) 800 MG tablet Take 1 tablet (800 mg total) by mouth 3 (three) times daily as needed. 30 tablet 3   aspirin EC 81 MG tablet Take 1 tablet (81 mg total) by mouth daily. Swallow whole. 90 tablet 3   Bacillus Coagulans-Inulin (PROBIOTIC) 1-250 BILLION-MG CAPS as directed Orally     CALCIUM-MAGNESIUM-ZINC PO Take 1 capsule by mouth daily.     cholecalciferol (VITAMIN D) 1000 units tablet Take 1,000 Units by mouth daily.     Coenzyme Q10 (CO Q 10 PO) Take 1 capsule by mouth daily.     [employer]S SUNSCREEN SPF 30 EX apply topically to face and body daily for 30     cyclobenzaprine (FLEXERIL) 10 MG tablet TAKE ONE TABLET BY MOUTH THREE TIMES A DAY AS NEEDED FOR MUSCLE SPASMS 15 tablet 0   diltiazem (CARDIZEM) 30 MG tablet TAKE 1 TABLET BY MOUTH EVERY 4 HOURS AS NEEDED FOR HEART RATE >100 30 tablet 2   ezetimibe (ZETIA) 10 MG tablet TAKE ONE TABLET BY MOUTH DAILY 30 tablet 11   Multiple Vitamin (MULTIVITAMIN) capsule Take 1 capsule by mouth daily.     rosuvastatin (CRESTOR) 40 MG tablet TAKE 1 TABLET BY MOUTH DAILY 90 tablet 1   typhoid (VIVOTIF) DR capsule Take 1 capsule  by mouth every other day. 4 capsule 0   nitroGLYCERIN (NITROSTAT) 0.4 MG SL tablet Place 1 tablet (0.4 mg total) under the tongue every 5 (five) minutes as needed for chest pain (If you take 3 tablets without resolution of chest pain, proceed to ER for evaluation.). 30 tablet 3   No current facility-administered medications on file prior to visit.    BP 110/72   Pulse 65   Temp 98 F (36.7 C) (Oral)   Ht 6' (1.829 m)   Wt 183 lb (83 kg)   SpO2 97%   BMI 24.82 kg/m       Objective:   Physical Exam Vitals and nursing note reviewed.  Constitutional:      General: He is not in acute distress.    Appearance: Normal appearance. He is not ill-appearing.  HENT:     Head: Normocephalic and atraumatic.     Right Ear: Tympanic membrane, ear canal and external ear normal. There is no impacted cerumen.     Left Ear: Tympanic membrane, ear canal and external ear normal. There is no impacted cerumen.     Nose: Nose normal. No congestion or rhinorrhea.     Mouth/Throat:     Mouth: Mucous membranes are moist.     Pharynx: Oropharynx is clear.  Eyes:     Extraocular Movements: Extraocular movements intact.     Conjunctiva/sclera: Conjunctivae normal.     Pupils: Pupils are equal, round, and reactive to light.  Neck:     Vascular: No carotid bruit.  Cardiovascular:     Rate and Rhythm: Normal rate and regular rhythm.     Pulses: Normal pulses.     Heart sounds: No murmur heard.    No friction rub. No gallop.  Pulmonary:     Effort: Pulmonary effort is normal.  Breath sounds: Normal breath sounds.  Abdominal:     General: Abdomen is flat. Bowel sounds are normal. There is no distension.     Palpations: Abdomen is soft. There is no mass.     Tenderness: There is no abdominal tenderness. There is no guarding or rebound.     Hernia: No hernia is present.  Musculoskeletal:        General: Normal range of motion.     Cervical back: Normal range of motion and neck supple.   Lymphadenopathy:     Cervical: No cervical adenopathy.  Skin:    General: Skin is warm and dry.     Capillary Refill: Capillary refill takes less than 2 seconds.  Neurological:     General: No focal deficit present.     Mental Status: He is alert and oriented to person, place, and time.  Psychiatric:        Mood and Affect: Mood normal.        Behavior: Behavior normal.        Thought Content: Thought content normal.        Judgment: Judgment normal.        Assessment & Plan:   1. Dyslipidemia - Continue with statin and Zetia - Follow up with Cardiology as directed   - Lipid panel; Future - TSH; Future - CBC; Future - Comprehensive metabolic panel; Future  2. OSA on CPAP - Encouraged to wear CPAP - Lipid panel; Future - TSH; Future - CBC; Future - Comprehensive metabolic panel; Future  3. Typical atrial flutter (HCC) - Per cardiology  - Lipid panel; Future - TSH; Future - CBC; Future - Comprehensive metabolic panel; Future  4. HSV-1 (herpes simplex virus 1) infection - No recent outbreaks. Continue with acyclovir as directed   5. Transient insomnia  - diazepam (VALIUM) 5 MG tablet; Take 1 tablet (5 mg total) by mouth at bedtime as needed for anxiety.  Dispense: 15 tablet; Refill: 0  6. Benign prostatic hyperplasia without lower urinary tract symptoms  - PSA; Future  7. Need for influenza vaccination  - Flu Vaccine Trivalent High Dose (Fluad)  Shirline Frees, NP

## 2022-11-29 NOTE — Patient Instructions (Signed)
It was great seeing you today   We will follow up with you regarding your lab work   Please let me know if you need anything   

## 2022-12-06 DIAGNOSIS — Z23 Encounter for immunization: Secondary | ICD-10-CM | POA: Diagnosis not present

## 2023-01-08 ENCOUNTER — Other Ambulatory Visit: Payer: Self-pay

## 2023-01-08 DIAGNOSIS — E7849 Other hyperlipidemia: Secondary | ICD-10-CM

## 2023-01-08 NOTE — Progress Notes (Signed)
NMR reordered. Previous order expired.

## 2023-01-09 LAB — NMR, LIPOPROFILE
Cholesterol, Total: 147 mg/dL (ref 100–199)
HDL Particle Number: 40.7 umol/L (ref >=30.5)
HDL-C: 61 mg/dL (ref >39)
LDL Particle Number: 611 nmol/L (ref <1000)
LDL Size: 20.6 nm (ref >20.5)
LDL-C (NIH Calc): 68 mg/dL (ref 0–99)
LP-IR Score: 43 (ref <=45)
Small LDL Particle Number: 410 nmol/L (ref <=527)
Triglycerides: 100 mg/dL (ref 0–149)

## 2023-01-15 ENCOUNTER — Ambulatory Visit (HOSPITAL_BASED_OUTPATIENT_CLINIC_OR_DEPARTMENT_OTHER)
Admission: RE | Admit: 2023-01-15 | Discharge: 2023-01-15 | Disposition: A | Discharge status: Home / Self Care | Payer: Medicare Other | Source: Ambulatory Visit | Attending: Family | Admitting: Family

## 2023-01-15 ENCOUNTER — Encounter (HOSPITAL_BASED_OUTPATIENT_CLINIC_OR_DEPARTMENT_OTHER): Payer: Self-pay

## 2023-01-15 DIAGNOSIS — I77819 Aortic ectasia, unspecified site: Secondary | ICD-10-CM | POA: Insufficient documentation

## 2023-01-15 DIAGNOSIS — I25118 Atherosclerotic heart disease of native coronary artery with other forms of angina pectoris: Secondary | ICD-10-CM | POA: Insufficient documentation

## 2023-01-15 DIAGNOSIS — I77810 Thoracic aortic ectasia: Secondary | ICD-10-CM | POA: Diagnosis not present

## 2023-01-15 DIAGNOSIS — I2510 Atherosclerotic heart disease of native coronary artery without angina pectoris: Secondary | ICD-10-CM | POA: Insufficient documentation

## 2023-01-15 DIAGNOSIS — I700 Atherosclerosis of aorta: Secondary | ICD-10-CM | POA: Diagnosis not present

## 2023-01-15 MED ORDER — IOHEXOL 350 MG/ML SOLN
100.0000 mL | Freq: Once | INTRAVENOUS | Status: AC | PRN
  Administered 2023-01-15: 80 mL via INTRAVENOUS

## 2023-01-22 ENCOUNTER — Telehealth (HOSPITAL_BASED_OUTPATIENT_CLINIC_OR_DEPARTMENT_OTHER): Payer: Self-pay

## 2023-01-22 ENCOUNTER — Ambulatory Visit: Payer: Medicare Other | Attending: Internal Medicine | Admitting: Internal Medicine

## 2023-01-22 ENCOUNTER — Encounter: Payer: Self-pay | Admitting: Internal Medicine

## 2023-01-22 VITALS — BP 102/78 | HR 65 | Ht 72.0 in | Wt 179.8 lb

## 2023-01-22 DIAGNOSIS — E7849 Other hyperlipidemia: Secondary | ICD-10-CM | POA: Diagnosis not present

## 2023-01-22 DIAGNOSIS — I77819 Aortic ectasia, unspecified site: Secondary | ICD-10-CM

## 2023-01-22 DIAGNOSIS — I25118 Atherosclerotic heart disease of native coronary artery with other forms of angina pectoris: Secondary | ICD-10-CM

## 2023-01-22 DIAGNOSIS — E785 Hyperlipidemia, unspecified: Secondary | ICD-10-CM | POA: Diagnosis not present

## 2023-01-22 NOTE — Telephone Encounter (Signed)
-----   Message from Alver Sorrow sent at 01/21/2023  7:21 PM EST ----- Stable ascending aorta dilation 4 cm. Unclear why repeat testing was performed 6 months from last study rather than in 1 year as recommended. Repeat CT aorta 01/2024.

## 2023-01-22 NOTE — Telephone Encounter (Signed)
Called patient, no answer at this time. Left voice message (ok per DPR). 1 year f/u aorta CT order placed.

## 2023-01-22 NOTE — Progress Notes (Signed)
OFFICE NOTE  Chief Complaint:  Routine follow-up  Primary Care Physician: Shirline Frees, NP  HPI:  Steven Byrd is a 67 y.o. male with a past medical history significant for hyperlipidemia. He has been having recurrent palpitations over the past month, but worse in the past 2 days. He denies chest pain or dyspnea. He has been able to exercise without much difficulty, however, he noted his HR was higher than normal. Typically his resting HR is in the 50-60 range. He went to his PCP who referred him to the ER for an SVT. He was noted to be in 2:1 atrial flutter in the ER. He was given adenosine in the ER which caused a pause, but did not slow his rhythm. He was then given 15 mg IV cardizem and eventually converted back to sinus rhythm. His CHADSVASC score is 0. At discharge I recommended he stay on aspirin and start on long-acting Cardizem. He said that he wished to only take medication as needed. I warned him that he may have recurrent atrial flutter and affect did have a couple of episodes. He called the office and was eventually placed on Cardizem 120 mg 3 times a day. He's been taking this dose and has noted no recurrence of his atrial flutter. He had several questions today about management of atrial flutter and is pretty clearly is not what take medications for long periods of time. In addition he reports that in the past he's had an informal sleep study and underwent a uvulopalatoplasty, but still reports that he is told that he snores, stops breathing and may very well have apnea. Of course this may be a risk factor for his atrial flutter. He did undergo an echocardiogram which showed a normal EF, normal wall motion and normal diastolic function. Wall thickness is also normal. Chamber sizes are normal. There was a top normal descending aortic diameter. Otherwise agree benign echocardiogram. As mentioned in my hospital consult note, he is very athletic, exercises a number days a week  including long-distance running and cycling and he denies any anginal symptoms.  07/23/2017  Steven Byrd returns today for follow-up of atrial flutter.  He recently was evaluated by Dr. Johney Frame and determined to be candidate for atrial flutter ablation.  He underwent that procedure in January.  This was successful and resolve the symptoms.  He is CHADSVASC score is 0 and he was instructed to come off of aspirin, diltiazem and wean metoprolol.  He is now off of all medications and doing well.  Blood pressures well controlled.  He denies any chest pain or shortness of breath or recurrent palpitations.  01/09/2018  Steven Byrd was seen today in routine follow-up.  Unfortunately he was recently hospitalized and had some paroxysmal atrial fibrillation.  This was related to a fall which involved a subdural hematoma.  He was placed on Cardizem but cannot be anticoagulated.  He followed up in the A. fib clinic and had been did not to have any recurrent A. fib.  He follows up with me today.  Overall he seems to be doing well.  He is rehabilitating and has follow-up with neurology.  07/27/2021  Steven Byrd returns today for follow-up.  He was recently seen by Gillian Shields, NP.  He had had a calcium score because of concern about coronary artery disease and some recurrent palpitations.  This was markedly elevated at 1517, 94th percentile for age and sex matched control.  Ultimately a CT coronary angiogram was  pursued to rule out any obstructive coronary disease.  This demonstrated again a very high calcium score 1004-61, 93rd percentile for age and sex matched control however only minimal nonobstructive coronary disease which is reassuring.  Today he returns and remains asymptomatic.  He is somewhat concerned as to why he may have had such an early onset significant coronary calcification.  I suspect there is a genetic etiology for this.  Based on this information, he is lipid-lowering was intensified and  currently he is on rosuvastatin 40 and ezetimibe 10 mg daily.  Total cholesterol now 138, triglycerides 49, HDL 64 and LDL 63 which appears to be at target.  12/09/2021  Steven Byrd returns today for follow-up.  Since I last saw him I recommended adding ezetimibe to his statin for better lipid lowering.  So far he seems to be tolerating that well.  He is LDL particle number has come down significantly from 1139-657.  LDL-C now 54 down from 85.  His triglycerides are normal.  Small LDL particle number is low as well.  His LP(a) has been negative.  He denies any recurrent palpitations or atrial fibrillation.  He has had no worsening shortness of breath or chest pain.  He did undergo genetic testing which showed several variants of unknown significance including abnormalities in APO A5 and APO B.  01/22/2023  Steven Byrd seen today in follow-up.  He continues to have good control of his lipids.  LDL particle #611, LDL 68, triglycerides 100 and HDL 61.  He had a repeat CT scan of the aorta which shows a stable ascending thoracic aorta at 40 mm.  Aortic atherosclerosis was otherwise noted but no other findings.  He reports being very active.  He recently did some hiking on the [place] trail and was essentially asymptomatic with that.  He plans on doing his annual trip to [place].  PMHx:  Past Medical History:  Diagnosis Date   AAA (abdominal aortic aneurysm) (HCC) 2020   3.2 cm in 2020    Atrial flutter (HCC)    Dyslipidemia    OSA on CPAP 01/20/2014   Paroxysmal atrial fibrillation (HCC)     Past Surgical History:  Procedure Laterality Date   A-FLUTTER ABLATION N/A 03/22/2017   Procedure: A-FLUTTER ABLATION;  Surgeon: Hillis Range, MD;  Location: [facility];  Service: Cardiovascular;  Laterality: N/A;   CRANIOTOMY Right 09/04/2017   Procedure: CRANIOTOMY FOR SUBDURAL HEMATOMA;  Surgeon: Maeola Harman, MD;  Location: [facility] OR;  Service: Neurosurgery;  Laterality: Right;     FAMHx:  Family History  Problem Relation Age of Onset   Diabetes Father    Stroke Father    Hypertension Mother        ?   Heart attack Maternal Grandfather    Colon cancer Neg Hx     SOCHx:   reports that he quit smoking about 24 years ago. His smoking use included cigarettes. He has never used smokeless tobacco. He reports current alcohol use of about 14.0 - 21.0 standard drinks of alcohol per week. He reports that he does not use drugs.  ALLERGIES:  Allergies  Allergen Reactions   Ativan [Lorazepam]     irritable and agitated    ROS: Pertinent items noted in HPI and remainder of comprehensive ROS otherwise negative.  HOME MEDS: Current Outpatient Medications  Medication Sig Dispense Refill   acyclovir (ZOVIRAX) 800 MG tablet Take 1 tablet (800 mg total) by mouth 3 (three) times daily as needed.  30 tablet 3   aspirin EC 81 MG tablet Take 1 tablet (81 mg total) by mouth daily. Swallow whole. 90 tablet 3   CALCIUM-MAGNESIUM-ZINC PO Take 1 capsule by mouth daily.     Coenzyme Q10 (CO Q 10 PO) Take 1 capsule by mouth daily.     CVS SUNSCREEN SPF 30 EX apply topically to face and body daily for 30     cyclobenzaprine (FLEXERIL) 10 MG tablet TAKE ONE TABLET BY MOUTH THREE TIMES A DAY AS NEEDED FOR MUSCLE SPASMS 15 tablet 0   diazepam (VALIUM) 5 MG tablet Take 1 tablet (5 mg total) by mouth at bedtime as needed for anxiety. 15 tablet 0   diltiazem (CARDIZEM) 30 MG tablet TAKE 1 TABLET BY MOUTH EVERY 4 HOURS AS NEEDED FOR HEART RATE >100 30 tablet 2   ezetimibe (ZETIA) 10 MG tablet TAKE ONE TABLET BY MOUTH DAILY 30 tablet 11   Multiple Vitamin (MULTIVITAMIN) capsule Take 1 capsule by mouth daily.     rosuvastatin (CRESTOR) 40 MG tablet TAKE 1 TABLET BY MOUTH DAILY 90 tablet 1   typhoid (VIVOTIF) DR capsule Take 1 capsule by mouth every other day. 4 capsule 0   Bacillus Coagulans-Inulin (PROBIOTIC) 1-250 BILLION-MG CAPS as directed Orally (Patient not taking: Reported on  01/22/2023)     cholecalciferol (VITAMIN D) 1000 units tablet Take 1,000 Units by mouth daily. (Patient not taking: Reported on 01/22/2023)     nitroGLYCERIN (NITROSTAT) 0.4 MG SL tablet Place 1 tablet (0.4 mg total) under the tongue every 5 (five) minutes as needed for chest pain (If you take 3 tablets without resolution of chest pain, proceed to ER for evaluation.). 30 tablet 3   No current facility-administered medications for this visit.    LABS/IMAGING: No results found for this or any previous visit (from the past 48 hour(s)). No results found.  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  PLAN: 1.   Steven Byrd continues to do well.  He said good control over his lipids and is at target on combination therapy with statin and ezetimibe.  He denies any recurrent atrial fibrillation or flutter and had ablation of that in 2019.  He has been off of CPAP for some time although has some reported snoring.  Overall he is energy level is excellent.  He is CT shows stable thoracic aorta  which is 40 mm.  Will plan to repeat that again next year.  He remains asymptomatic and active.  Plan follow-up with me annually or sooner as necessary.  Chrystie Nose, MD, [facility], FACP  [place]  [facility]  Medical Director of the Advanced Lipid Disorders &  Cardiovascular Risk Reduction Clinic Diplomate of the American Board of Clinical Lipidology Attending Cardiologist  Direct Dial: [phone]  Fax: [phone]  Website:  [email or web address]  Lisette Abu April Colter 01/22/2023, 1:50 PM

## 2023-01-22 NOTE — Patient Instructions (Signed)
Medication Instructions:  No changes. *If you need a refill on your cardiac medications before your next appointment, please call your pharmacy*   Lab Work: BMET in one year. FASTING NMR in one year. If you have labs (blood work) drawn today and your tests are completely normal, you will receive your results only by: MyChart Message (if you have MyChart) OR A paper copy in the mail If you have any lab test that is abnormal or we need to change your treatment, we will call you to review the results.   Testing/Procedures: Repeat CT angio chest/aorta in one year.    Follow-Up: At [facility], you and your health needs are our priority.  As part of our continuing mission to provide you with exceptional heart care, we have created designated Provider Care Teams.  These Care Teams include your primary Cardiologist (physician) and Advanced Practice Providers (APPs -  Physician Assistants and Nurse Practitioners) who all work together to provide you with the care you need, when you need it.  We recommend signing up for the patient portal called "MyChart".  Sign up information is provided on this After Visit Summary.  MyChart is used to connect with patients for Virtual Visits (Telemedicine).  Patients are able to view lab/test results, encounter notes, upcoming appointments, etc.  Non-urgent messages can be sent to your provider as well.   To learn more about what you can do with MyChart, go to [email or web address].    Your next appointment:   12 month(s)  Provider:   Chrystie Nose, MD  {

## 2023-02-07 DIAGNOSIS — H353131 Nonexudative age-related macular degeneration, bilateral, early dry stage: Secondary | ICD-10-CM | POA: Diagnosis not present

## 2023-02-07 DIAGNOSIS — H401131 Primary open-angle glaucoma, bilateral, mild stage: Secondary | ICD-10-CM | POA: Diagnosis not present

## 2023-02-07 DIAGNOSIS — H04123 Dry eye syndrome of bilateral lacrimal glands: Secondary | ICD-10-CM | POA: Diagnosis not present

## 2023-02-07 DIAGNOSIS — H25813 Combined forms of age-related cataract, bilateral: Secondary | ICD-10-CM | POA: Diagnosis not present

## 2023-02-20 DIAGNOSIS — H401131 Primary open-angle glaucoma, bilateral, mild stage: Secondary | ICD-10-CM | POA: Diagnosis not present

## 2023-03-26 ENCOUNTER — Other Ambulatory Visit: Payer: Self-pay

## 2023-03-26 MED ORDER — EZETIMIBE 10 MG PO TABS: 10.0000 mg | ORAL_TABLET | Freq: Every day | ORAL | 3 refills | Status: DC

## 2023-03-28 DIAGNOSIS — H401131 Primary open-angle glaucoma, bilateral, mild stage: Secondary | ICD-10-CM | POA: Diagnosis not present

## 2023-04-05 DIAGNOSIS — C44319 Basal cell carcinoma of skin of other parts of face: Secondary | ICD-10-CM | POA: Diagnosis not present

## 2023-04-05 DIAGNOSIS — L821 Other seborrheic keratosis: Secondary | ICD-10-CM | POA: Diagnosis not present

## 2023-04-05 DIAGNOSIS — D225 Melanocytic nevi of trunk: Secondary | ICD-10-CM | POA: Diagnosis not present

## 2023-04-05 DIAGNOSIS — L578 Other skin changes due to chronic exposure to nonionizing radiation: Secondary | ICD-10-CM | POA: Diagnosis not present

## 2023-04-05 DIAGNOSIS — L814 Other melanin hyperpigmentation: Secondary | ICD-10-CM | POA: Diagnosis not present

## 2023-04-05 DIAGNOSIS — L570 Actinic keratosis: Secondary | ICD-10-CM | POA: Diagnosis not present

## 2023-04-05 DIAGNOSIS — D485 Neoplasm of uncertain behavior of skin: Secondary | ICD-10-CM | POA: Diagnosis not present

## 2023-04-05 DIAGNOSIS — Z85828 Personal history of other malignant neoplasm of skin: Secondary | ICD-10-CM | POA: Diagnosis not present

## 2023-05-09 ENCOUNTER — Other Ambulatory Visit: Payer: Self-pay

## 2023-05-09 DIAGNOSIS — E7849 Other hyperlipidemia: Secondary | ICD-10-CM

## 2023-05-09 DIAGNOSIS — I2510 Atherosclerotic heart disease of native coronary artery without angina pectoris: Secondary | ICD-10-CM

## 2023-05-09 DIAGNOSIS — E785 Hyperlipidemia, unspecified: Secondary | ICD-10-CM

## 2023-05-09 MED ORDER — ROSUVASTATIN CALCIUM 40 MG PO TABS: 40.0000 mg | ORAL_TABLET | Freq: Every day | ORAL | 2 refills | Status: DC

## 2023-05-16 DIAGNOSIS — C44319 Basal cell carcinoma of skin of other parts of face: Secondary | ICD-10-CM | POA: Diagnosis not present

## 2023-05-25 DIAGNOSIS — H00024 Hordeolum internum left upper eyelid: Secondary | ICD-10-CM | POA: Diagnosis not present

## 2023-06-13 DIAGNOSIS — H00024 Hordeolum internum left upper eyelid: Secondary | ICD-10-CM | POA: Diagnosis not present

## 2023-07-06 ENCOUNTER — Other Ambulatory Visit: Payer: Self-pay | Admitting: Adult Health

## 2023-07-06 DIAGNOSIS — F5102 Adjustment insomnia: Secondary | ICD-10-CM

## 2023-07-06 NOTE — Telephone Encounter (Signed)
 Okay for refill?

## 2023-07-16 DIAGNOSIS — Z23 Encounter for immunization: Secondary | ICD-10-CM | POA: Diagnosis not present

## 2023-07-25 DIAGNOSIS — H401131 Primary open-angle glaucoma, bilateral, mild stage: Secondary | ICD-10-CM | POA: Diagnosis not present

## 2023-09-03 ENCOUNTER — Ambulatory Visit: Payer: Self-pay
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
## 2023-09-05 NOTE — Telephone Encounter (Signed)
 Tried to call pt no answer. No medication can be sent in for that without an appt.

## 2023-09-11 NOTE — Telephone Encounter (Signed)
 Called pt to check on the status no answer
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
## 2023-11-09 ENCOUNTER — Ambulatory Visit

## 2023-11-09 VITALS — Ht 73.0 in | Wt 183.0 lb

## 2023-11-09 DIAGNOSIS — Z1211 Encounter for screening for malignant neoplasm of colon: Secondary | ICD-10-CM

## 2023-11-09 DIAGNOSIS — Z0000 Encounter for general adult medical examination without abnormal findings: Secondary | ICD-10-CM | POA: Diagnosis not present

## 2023-11-09 NOTE — Patient Instructions (Addendum)
 Steven Byrd,  Thank you for taking the time for your Medicare Wellness Visit. I appreciate your continued commitment to your health goals. Please review the care plan we discussed, and feel free to reach out if I can assist you further.  Medicare recommends these wellness visits once per year to help you and your care team stay ahead of potential health issues. These visits are designed to focus on prevention, allowing your provider to concentrate on managing your acute and chronic conditions during your regular appointments.  Please note that Annual Wellness Visits do not include a physical exam. Some assessments may be limited, especially if the visit was conducted virtually. If needed, we may recommend a separate in-person follow-up with your provider.  Ongoing Care Seeing your primary care provider every 3 to 6 months helps us  monitor your health and provide consistent, personalized care.   Referrals If a referral was made during today's visit and you haven't received any updates within two weeks, please contact the referred provider directly to check on the status.  Recommended Screenings:  Health Maintenance  Topic Date Due   Pneumococcal Vaccine for age over 53 (2 of 2 - PPSV23, PCV20, or PCV21) 01/01/2019   Flu Shot  10/05/2023   Colon Cancer Screening  11/13/2023   Medicare Annual Wellness Visit  11/08/2024   DTaP/Tdap/Td vaccine (4 - Td or Tdap) 11/24/2029   Hepatitis C Screening  Completed   Zoster (Shingles) Vaccine  Completed   HPV Vaccine  Aged Out   Meningitis B Vaccine  Aged Out   COVID-19 Vaccine  Discontinued       11/09/2023   10:45 AM  Advanced Directives  Does Patient Have a Medical Advance Directive? Yes  Type of Estate agent of [place];Living will  Copy of Healthcare Power of Attorney in Chart? No - copy requested   Advance Care Planning is important because it: Ensures you receive medical care that aligns with your values, goals,  and preferences. Provides guidance to your family and loved ones, reducing the emotional burden of decision-making during critical moments.  Vision: Annual vision screenings are recommended for early detection of glaucoma, cataracts, and diabetic retinopathy. These exams can also reveal signs of chronic conditions such as diabetes and high blood pressure.  Dental: Annual dental screenings help detect early signs of oral cancer, gum disease, and other conditions linked to overall health, including heart disease and diabetes.  Please see the attached documents for additional preventive care recommendations.

## 2023-11-09 NOTE — Progress Notes (Signed)
 Subjective:   Steven Byrd is a 68 y.o. who presents for a Medicare Wellness preventive visit.  As a reminder, Annual Wellness Visits don't include a physical exam, and some assessments may be limited, especially if this visit is performed virtually. We may recommend an in-person follow-up visit with your provider if needed.  Visit Complete: Virtual I connected with  Steven Byrd on 11/09/23 by a video and audio enabled telemedicine application and verified that I am speaking with the correct person using two identifiers.  Patient Location: Home  Provider Location: Home Office  I discussed the limitations of evaluation and management by telemedicine. The patient expressed understanding and agreed to proceed.  Vital Signs: Because this visit was a virtual/telehealth visit, some criteria may be missing or patient reported. Any vitals not documented were not able to be obtained and vitals that have been documented are patient reported.    Persons Participating in Visit: Patient.  AWV Questionnaire: Yes: Patient Medicare AWV questionnaire was completed by the patient on 11/03/23; I have confirmed that all information answered by patient is correct and no changes since this date.  Cardiac Risk Factors include: advanced age (>16men, >75 women);male gender;dyslipidemia     Objective:    Today's Vitals   11/09/23 1036  Weight: 183 lb (83 kg)  Height: 6' 1 (1.854 m)   Body mass index is 24.14 kg/m.     11/09/2023   10:45 AM 07/22/2021    3:29 PM 07/21/2020   11:15 AM 04/09/2020    3:56 PM 09/23/2019    2:08 PM 05/23/2019    9:03 AM 03/22/2017    6:38 AM  Advanced Directives  Does Patient Have a Medical Advance Directive? Yes Yes Yes Yes Yes Yes Yes   Type of Estate agent of [place];Living will Healthcare Power of [place];Living will Healthcare Power of [place] Living will Healthcare Power of [place];Living will;Out of facility DNR (pink MOST or  yellow form) Healthcare Power of [place];Living will Healthcare Power of [place];Living will  Does patient want to make changes to medical advance directive?  No - Patient declined     No - Patient declined   Copy of Healthcare Power of Attorney in Chart? No - copy requested No - copy requested No - copy requested    Yes      Data saved with a previous flowsheet row definition    Current Medications (verified) Outpatient Encounter Medications as of 11/09/2023  Medication Sig   acyclovir  (ZOVIRAX ) 800 MG tablet Take 1 tablet (800 mg total) by mouth 3 (three) times daily as needed.   aspirin  EC 81 MG tablet Take 1 tablet (81 mg total) by mouth daily. Swallow whole.   Bacillus Coagulans-Inulin (PROBIOTIC) 1-250 BILLION-MG CAPS as directed Orally (Patient not taking: Reported on 01/22/2023)   CALCIUM -MAGNESIUM -ZINC  PO Take 1 capsule by mouth daily.   cholecalciferol  (VITAMIN D ) 1000 units tablet Take 1,000 Units by mouth daily. (Patient not taking: Reported on 01/22/2023)   Coenzyme Q10 (CO Q 10 PO) Take 1 capsule by mouth daily.   [employer] SUNSCREEN SPF 30 EX apply topically to face and body daily for 30   cyclobenzaprine  (FLEXERIL ) 10 MG tablet TAKE ONE TABLET BY MOUTH THREE TIMES A DAY AS NEEDED FOR MUSCLE SPASMS   diazepam  (VALIUM ) 5 MG tablet TAKE 1 TABLET BY MOUTH AT BEDTIME AS NEEDED FOR ANXIETY   diltiazem  (CARDIZEM ) 30 MG tablet TAKE 1 TABLET BY MOUTH EVERY 4 HOURS AS NEEDED FOR HEART  RATE >100   ezetimibe  (ZETIA ) 10 MG tablet Take 1 tablet (10 mg total) by mouth daily.   Multiple Vitamin (MULTIVITAMIN) capsule Take 1 capsule by mouth daily.   nitroGLYCERIN  (NITROSTAT ) 0.4 MG SL tablet Place 1 tablet (0.4 mg total) under the tongue every 5 (five) minutes as needed for chest pain (If you take 3 tablets without resolution of chest pain, proceed to ER for evaluation.).   rosuvastatin  (CRESTOR ) 40 MG tablet Take 1 tablet (40 mg total) by mouth daily.   typhoid (VIVOTIF ) DR capsule Take 1 capsule  by mouth every other day.   No facility-administered encounter medications on file as of 11/09/2023.    Allergies (verified) Ativan  [lorazepam ]   History: Past Medical History:  Diagnosis Date   AAA (abdominal aortic aneurysm) (HCC) 2020   3.2 cm in 2020    Atrial flutter (HCC)    Dyslipidemia    OSA on CPAP 01/20/2014   Paroxysmal atrial fibrillation (HCC)    Past Surgical History:  Procedure Laterality Date   A-FLUTTER ABLATION N/A 03/22/2017   Procedure: A-FLUTTER ABLATION;  Surgeon: Kelsie Agent, MD;  Location: [facility];  Service: Cardiovascular;  Laterality: N/A;   CRANIOTOMY Right 09/04/2017   Procedure: CRANIOTOMY FOR SUBDURAL HEMATOMA;  Surgeon: Unice Pac, MD;  Location: [facility] OR;  Service: Neurosurgery;  Laterality: Right;   Family History  Problem Relation Age of Onset   Diabetes Father    Stroke Father    Hypertension Mother        ?   Heart attack Maternal Grandfather    Colon cancer Neg Hx    Social History   Socioeconomic History   Marital status: Significant Other    Spouse name: Not on file   Number of children: Not on file   Years of education: Not on file   Highest education level: Bachelor's degree (e.g., BA, AB, BS)  Occupational History   Not on file  Tobacco Use   Smoking status: Former    Current packs/day: 0.00    Types: Cigarettes    Quit date: 10/16/1998    Years since quitting: 25.0   Smokeless tobacco: Never  Vaping Use   Vaping status: Never Used  Substance and Sexual Activity   Alcohol  use: Yes    Alcohol /week: 14.0 - 21.0 standard drinks of alcohol     Types: 14 - 21 Glasses of wine per week    Comment: red wine   Drug use: No   Sexual activity: Yes    Partners: Female  Other Topics Concern   Not on file  Social History Narrative   Has a Administrator, 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       He likes to hike, scuba dive, and travel       Lives in single story home    Alisa - significant other   4 year degree   Right handed    Social Drivers of Health   Financial Resource Strain: Low Risk  (11/09/2023)   Overall Financial Resource Strain (CARDIA)    Difficulty of Paying Living Expenses: Not hard at all  Food Insecurity: No Food Insecurity (11/09/2023)   Hunger Vital Sign    Worried About Running Out of Food in the Last Year: Never true    Ran Out of Food in the Last Year: Never true  Transportation  Needs: No Transportation Needs (11/09/2023)   PRAPARE - Administrator, Civil Service (Medical): No    Lack of Transportation (Non-Medical): No  Physical Activity: Sufficiently Active (11/09/2023)   Exercise Vital Sign    Days of Exercise per Week: 6 days    Minutes of Exercise per Session: 60 min  Stress: No Stress Concern Present (11/09/2023)   [employer] of Occupational Health - Occupational Stress Questionnaire    Feeling of Stress: Only a little  Social Connections: Socially Integrated (11/09/2023)   Social Connection and Isolation Panel    Frequency of Communication with Friends and Family: More than three times a week    Frequency of Social Gatherings with Friends and Family: Three times a week    Attends Religious Services: 1 to 4 times per year    Active Member of Clubs or Organizations: Yes    Attends Banker Meetings: Not on file    Marital Status: Living with partner    Tobacco Counseling Counseling given: Not Answered    Clinical Intake:  Pre-visit preparation completed: Yes  Pain : No/denies pain     BMI - recorded: 24.14 Nutritional Status: BMI of 19-24  Normal Nutritional Risks: None Diabetes: No  No results found for: HGBA1C   How often do you need to have someone help you when you read instructions, pamphlets, or other written materials from your doctor or pharmacy?: 1 - Never  Interpreter Needed?: No  Information entered by :: Rojelio Blush  LPN   Activities of Daily Living     11/09/2023   10:43 AM 11/03/2023    5:39 PM  In your present state of health, do you have any difficulty performing the following activities:  Hearing? 0 0  Vision? 0 0  Difficulty concentrating or making decisions? 0 1  Walking or climbing stairs? 0 0  Dressing or bathing? 0 0  Doing errands, shopping? 0 0  Preparing Food and eating ? N N  Using the Toilet? N N  In the past six months, have you accidently leaked urine? N N  Do you have problems with loss of bowel control? N N  Managing your Medications? N N  Managing your Finances? N N  Housekeeping or managing your Housekeeping? N N    Patient Care Team: Merna Huxley, NP as PCP - General (Family Medicine) Mona Vinie BROCKS, MD as PCP - Cardiology (Cardiology) Georjean Darice HERO, MD as Consulting Physician (Neurology)  I have updated your Care Teams any recent Medical Services you may have received from other providers in the past year.     Assessment:   This is a routine wellness examination for Steven Byrd.  Hearing/Vision screen Hearing Screening - Comments:: Denies hearing difficulties   Vision Screening - Comments:: Wears rx glasses - up to date with [facility] exams with  [facility]   Goals Addressed               This Visit's Progress     Continue physical activity (pt-stated)        Remain active.       Depression Screen     11/09/2023   10:47 AM 11/29/2022   11:33 AM 08/10/2022   10:11 AM 05/03/2022    3:41 PM 07/22/2021    3:24 PM 07/21/2020   11:15 AM 07/26/2017    2:32 PM  PHQ 2/9 Scores  PHQ - 2 Score 0 0 0 0 0 0 0  PHQ- 9  Score  0  2       Fall Risk     11/09/2023   10:44 AM 11/03/2023    5:39 PM 11/29/2022   11:33 AM 08/10/2022    9:38 AM 08/09/2022   12:49 PM  Fall Risk   Falls in the past year? 0 0 1 1 1   Number falls in past yr:  0 0 0 0  Injury with Fall?  1 0 0 1  Risk for fall due to :   No Fall Risks Other (Comment)   Risk for fall due to: Comment     pt reports he fell from a ladder.   Follow up   Falls evaluation completed Falls evaluation completed     MEDICARE RISK AT HOME:  Medicare Risk at Home Any stairs in or around the home?: No If so, are there any without handrails?: No Home free of loose throw rugs in walkways, pet beds, electrical cords, etc?: Yes Adequate lighting in your home to reduce risk of falls?: Yes Life alert?: No Use of a cane, walker or w/c?: No Grab bars in the bathroom?: Yes Shower chair or bench in shower?: Yes Elevated toilet seat or a handicapped toilet?: No  TIMED UP AND GO:  Was the test performed?  No  Cognitive Function: 6CIT completed      10/23/2018   10:00 AM  Montreal Cognitive Assessment   Visuospatial/ Executive (0/5) 5  Naming (0/3) 3  Attention: Read list of digits (0/2) 2  Attention: Read list of letters (0/1) 1  Attention: Serial 7 subtraction starting at 100 (0/3) 3  Language: Repeat phrase (0/2) 2  Language : Fluency (0/1) 1  Abstraction (0/2) 2  Delayed Recall (0/5) 4  Orientation (0/6) 6  Total 29      11/09/2023   10:45 AM 07/22/2021    3:29 PM 07/21/2020   11:19 AM  6CIT Screen  What Year? 0 points 0 points 0 points  What month? 0 points 0 points 0 points  What time? 0 points 0 points   Count back from 20 0 points 0 points 0 points  Months in reverse 0 points 0 points 0 points  Repeat phrase 0 points 0 points 0 points  Total Score 0 points 0 points     Immunizations Immunization History  Administered Date(s) Administered   Fluad Quad(high Dose 65+) 11/06/2018, 11/25/2019, 12/21/2021   Fluad Trivalent(High Dose 65+) 11/29/2022   Hepatitis A, Adult 11/27/2014   Influenza Inj Mdck Quad Pf 01/01/2017   Influenza Split 01/18/2012, 01/17/2013   Influenza Whole 01/12/2009, 02/08/2010   Influenza,inj,Quad PF,6+ Mos 11/10/2014, 12/29/2015, 12/16/2017   Influenza-Unspecified 01/07/2014   PFIZER Comirnaty(Gray Top)Covid-19 Tri-Sucrose Vaccine 04/12/2019, 05/03/2019,  12/02/2019, 06/04/2020   Pneumococcal Conjugate-13 11/06/2018   Td 03/07/1999, 02/08/2010   Tdap 11/25/2019   Typhoid Inactivated 11/27/2014   Zoster Recombinant(Shingrix) 04/28/2018, 09/15/2018    Screening Tests Health Maintenance  Topic Date Due   Pneumococcal Vaccine: 50+ Years (2 of 2 - PPSV23, PCV20, or PCV21) 01/01/2019   Influenza Vaccine  10/05/2023   Colonoscopy  11/13/2023   Medicare Annual Wellness (AWV)  11/08/2024   DTaP/Tdap/Td (4 - Td or Tdap) 11/24/2029   Hepatitis C Screening  Completed   Zoster Vaccines- Shingrix  Completed   HPV VACCINES  Aged Out   Meningococcal B Vaccine  Aged Out   COVID-19 Vaccine  Discontinued    Health Maintenance  Health Maintenance Due  Topic Date Due   Pneumococcal Vaccine:  50+ Years (2 of 2 - PPSV23, PCV20, or PCV21) 01/01/2019   Influenza Vaccine  10/05/2023   Colonoscopy  11/13/2023   Health Maintenance Items Addressed: Referral sent to GI for colonoscopy  Additional Screening:  Vision Screening: Recommended annual ophthalmology exams for early detection of glaucoma and other disorders of the eye. Would you like a referral to an eye doctor? No    Dental Screening: Recommended annual dental exams for proper oral hygiene  Community Resource Referral / Chronic Care Management: CRR required this visit?  No   CCM required this visit?  No   Plan:    I have personally reviewed and noted the following in the patient's chart:   Medical and social history Use of alcohol , tobacco or illicit drugs  Current medications and supplements including opioid prescriptions. Patient is not currently taking opioid prescriptions. Functional ability and status Nutritional status Physical activity Advanced directives List of other physicians Hospitalizations, surgeries, and ER visits in previous 12 months Vitals Screenings to include cognitive, depression, and falls Referrals and appointments  In addition, I have reviewed and  discussed with patient certain preventive protocols, quality metrics, and best practice recommendations. A written personalized care plan for preventive services as well as general preventive health recommendations were provided to patient.   Rojelio LELON Blush, LPN   [id number]/7972   After Visit Summary: (MyChart) Due to this being a telephonic visit, the after visit summary with patients personalized plan was offered to patient via MyChart   Notes: Nothing significant to report at this time.

## 2023-11-11 ENCOUNTER — Encounter: Payer: Self-pay | Admitting: Adult Health

## 2023-11-13 MED ORDER — COVID-19 MRNA VACC (MODERNA) 50 MCG/0.5ML IM SUSP: 0.5000 mL | Freq: Once | INTRAMUSCULAR | 0 refills | Status: AC

## 2023-11-28 ENCOUNTER — Other Ambulatory Visit: Payer: Self-pay | Admitting: *Deleted

## 2023-11-28 DIAGNOSIS — E7849 Other hyperlipidemia: Secondary | ICD-10-CM

## 2023-11-28 DIAGNOSIS — E785 Hyperlipidemia, unspecified: Secondary | ICD-10-CM

## 2023-11-28 DIAGNOSIS — I77819 Aortic ectasia, unspecified site: Secondary | ICD-10-CM

## 2023-11-28 DIAGNOSIS — I25118 Atherosclerotic heart disease of native coronary artery with other forms of angina pectoris: Secondary | ICD-10-CM

## 2023-11-30 ENCOUNTER — Ambulatory Visit: Payer: Self-pay | Admitting: Adult Health

## 2023-11-30 ENCOUNTER — Ambulatory Visit: Payer: Medicare Other | Admitting: Adult Health

## 2023-11-30 ENCOUNTER — Encounter: Payer: Self-pay | Admitting: Gastroenterology

## 2023-11-30 VITALS — BP 110/80 | HR 64 | Temp 98.2°F | Ht 72.0 in | Wt 189.0 lb

## 2023-11-30 DIAGNOSIS — B009 Herpesviral infection, unspecified: Secondary | ICD-10-CM | POA: Diagnosis not present

## 2023-11-30 DIAGNOSIS — G4733 Obstructive sleep apnea (adult) (pediatric): Secondary | ICD-10-CM

## 2023-11-30 DIAGNOSIS — I2510 Atherosclerotic heart disease of native coronary artery without angina pectoris: Secondary | ICD-10-CM

## 2023-11-30 DIAGNOSIS — E785 Hyperlipidemia, unspecified: Secondary | ICD-10-CM

## 2023-11-30 DIAGNOSIS — I483 Typical atrial flutter: Secondary | ICD-10-CM

## 2023-11-30 DIAGNOSIS — F5102 Adjustment insomnia: Secondary | ICD-10-CM

## 2023-11-30 DIAGNOSIS — N400 Enlarged prostate without lower urinary tract symptoms: Secondary | ICD-10-CM | POA: Diagnosis not present

## 2023-11-30 DIAGNOSIS — Z23 Encounter for immunization: Secondary | ICD-10-CM

## 2023-11-30 LAB — CBC
HCT: 41.8 % (ref 39.0–52.0)
Hemoglobin: 14 g/dL (ref 13.0–17.0)
MCHC: 33.6 g/dL (ref 30.0–36.0)
MCV: 99.5 fl (ref 78.0–100.0)
Platelets: 192 K/uL (ref 150.0–400.0)
RBC: 4.2 Mil/uL — ABNORMAL LOW (ref 4.22–5.81)
RDW: 13.6 % (ref 11.5–15.5)
WBC: 4.3 K/uL (ref 4.0–10.5)

## 2023-11-30 LAB — LIPID PANEL
Cholesterol: 141 mg/dL (ref 0–200)
HDL: 59 mg/dL (ref >39.00)
LDL Cholesterol: 48 mg/dL (ref 0–99)
NonHDL: 81.83
Total CHOL/HDL Ratio: 2
Triglycerides: 168 mg/dL — ABNORMAL HIGH (ref 0.0–149.0)
VLDL: 33.6 mg/dL (ref 0.0–40.0)

## 2023-11-30 LAB — COMPREHENSIVE METABOLIC PANEL WITH GFR
ALT: 25 U/L (ref 0–53)
AST: 26 U/L (ref 0–37)
Albumin: 4.5 g/dL (ref 3.5–5.2)
Alkaline Phosphatase: 68 U/L (ref 39–117)
BUN: 22 mg/dL (ref 6–23)
CO2: 29 meq/L (ref 19–32)
Calcium: 9.3 mg/dL (ref 8.4–10.5)
Chloride: 101 meq/L (ref 96–112)
Creatinine, Ser: 0.92 mg/dL (ref 0.40–1.50)
GFR: 84.39 mL/min (ref >60.00)
Glucose, Bld: 90 mg/dL (ref 70–99)
Potassium: 4.2 meq/L (ref 3.5–5.1)
Sodium: 137 meq/L (ref 135–145)
Total Bilirubin: 1.7 mg/dL — ABNORMAL HIGH (ref 0.2–1.2)
Total Protein: 7.4 g/dL (ref 6.0–8.3)

## 2023-11-30 LAB — TSH: TSH: 1.98 u[IU]/mL (ref 0.35–5.50)

## 2023-11-30 LAB — PSA: PSA: 1.37 ng/mL (ref 0.10–4.00)

## 2023-11-30 MED ORDER — DIAZEPAM 5 MG PO TABS: 5.0000 mg | ORAL_TABLET | Freq: Every evening | ORAL | 2 refills | Status: AC | PRN

## 2023-11-30 NOTE — Progress Notes (Signed)
 Subjective:    Patient ID: Steven Byrd, male    DOB: June 16, 1955, 68 y.o.   MRN: [id number]  HPI Patient presents for yearly preventative medicine examination. He is a pleasant 68 year old male who  has a past medical history of AAA (abdominal aortic aneurysm) (2020), Atrial flutter (HCC), Dyslipidemia, OSA on CPAP (01/20/2014), and Paroxysmal atrial fibrillation (HCC).  He is retired and is doing a lot of international travel.   Familial dyslipidemia - - managed with Crestor  40 mg daily and Zetia  10 mg daily.SABRA He denies myalgia or fatigue.  He is seen by cardiology.  His CT calcium  score was 1461 which put him in the 93rd percentile for age and sex, however had minimal nonobstructive coronary artery disease in February 2023. He denies chest pain or shortness of breath.  Lab Results  Component Value Date   CHOL 118 11/29/2022   HDL 58.10 11/29/2022   LDLCALC 47 11/29/2022   LDLDIRECT 126.8 02/01/2010   TRIG 66.0 11/29/2022   CHOLHDL 2 11/29/2022   OSA-has not used CPAP routinely as of recent.   Denies daytime somnolence, waking up feeling fatigue, or the need for naps  History of atrial flutter-status post ablation in 2019.  He was seen in the emergency room in August 2022 after noticing that his heart rate was 160-1 0s via his Apple Watch.  In the emergency room he was back into normal sinus rhythm.  He followed up with cardiology later that month and was prescribed a prescription for Cardizem  30 mg as needed if he has recurrent symptoms. 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  Insomnia - takes valium  periodically.   BPH - has recently started to have nocturia with 1-2 times a night. Denies need for medication   All immunizations and health maintenance protocols were reviewed with the  patient and needed orders were placed.  Appropriate screening laboratory values were ordered for the patient including screening of hyperlipidemia, renal function and hepatic function. If indicated by BPH, a PSA was ordered.  Medication reconciliation,  past medical history, social history, problem list and allergies were reviewed in detail with the patient  Goals were established with regard to weight loss, exercise, and  diet in compliance with medications  He is due for colon cancer screening   He has no acute issues   Review of Systems  Constitutional: Negative.   HENT: Negative.    Eyes: Negative.   Respiratory: Negative.    Cardiovascular: Negative.   Gastrointestinal: Negative.   Endocrine: Negative.   Genitourinary: Negative.        Nocturia    Musculoskeletal: Negative.   Skin: Negative.   Allergic/Immunologic: Negative.   Neurological: Negative.   Hematological: Negative.   Psychiatric/Behavioral:  Positive for sleep disturbance.   All other systems reviewed and are negative.  Past Medical History:  Diagnosis Date   AAA (abdominal aortic aneurysm) 2020   3.2 cm in 2020    Atrial flutter (HCC)    Dyslipidemia    OSA on CPAP 01/20/2014   Paroxysmal atrial fibrillation (HCC)     Social History   Socioeconomic History   Marital status: Significant Other    Spouse name: Not on file   Number of children: Not on file   Years of education: Not on file  Highest education level: Bachelor's degree (e.g., BA, AB, BS)  Occupational History   Not on file  Tobacco Use   Smoking status: Former    Current packs/day: 0.00    Types: Cigarettes    Quit date: 10/16/1998    Years since quitting: 25.1   Smokeless tobacco: Never  Vaping Use   Vaping status: Never Used  Substance and Sexual Activity   Alcohol  use: Yes    Alcohol /week: 14.0 - 21.0 standard drinks of alcohol     Types: 14 - 21 Glasses of wine per week    Comment: red wine   Drug use: No   Sexual  activity: Yes    Partners: Female  Other Topics Concern   Not on file  Social History Narrative   Has a Administrator, [employer]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       He likes to hike, scuba dive, and travel      Lives in single story home    Alisa - significant other   4 year degree   Right handed    Social Drivers of Health   Financial Resource Strain: Low Risk  (11/09/2023)   Overall Financial Resource Strain (CARDIA)    Difficulty of Paying Living Expenses: Not hard at all  Food Insecurity: No Food Insecurity (11/09/2023)   Hunger Vital Sign    Worried About Running Out of Food in the Last Year: Never true    Ran Out of Food in the Last Year: Never true  Transportation Needs: No Transportation Needs (11/09/2023)   PRAPARE - Administrator, Civil Service (Medical): No    Lack of Transportation (Non-Medical): No  Physical Activity: Sufficiently Active (11/09/2023)   Exercise Vital Sign    Days of Exercise per Week: 6 days    Minutes of Exercise per Session: 60 min  Stress: No Stress Concern Present (11/09/2023)   [employer] of Occupational Health - Occupational Stress Questionnaire    Feeling of Stress: Only a little  Social Connections: Socially Integrated (11/09/2023)   Social Connection and Isolation Panel    Frequency of Communication with Friends and Family: More than three times a week    Frequency of Social Gatherings with Friends and Family: Three times a week    Attends Religious Services: 1 to 4 times per year    Active Member of Clubs or Organizations: Yes    Attends Banker Meetings: Not on file    Marital Status: Living with partner  Intimate Partner Violence: Not At Risk (11/09/2023)   Humiliation, Afraid, Rape, and Kick questionnaire    Fear of Current or Ex-Partner: No    Emotionally Abused: No    Physically Abused: No    Sexually Abused: No    Past Surgical  History:  Procedure Laterality Date   A-FLUTTER ABLATION N/A 03/22/2017   Procedure: A-FLUTTER ABLATION;  Surgeon: Kelsie Agent, MD;  Location: [facility];  Service: Cardiovascular;  Laterality: N/A;   CRANIOTOMY Right 09/04/2017   Procedure: CRANIOTOMY FOR SUBDURAL HEMATOMA;  Surgeon: Unice Pac, MD;  Location: [facility] OR;  Service: Neurosurgery;  Laterality: Right;    Family History  Problem Relation Age of Onset   Diabetes Father    Stroke Father    Hypertension Mother        ?   Heart attack Maternal Grandfather  Colon cancer Neg Hx     Allergies  Allergen Reactions   Ativan  [Lorazepam ]     irritable and agitated    Current Outpatient Medications on File Prior to Visit  Medication Sig Dispense Refill   acyclovir  (ZOVIRAX ) 800 MG tablet Take 1 tablet (800 mg total) by mouth 3 (three) times daily as needed. 30 tablet 3   aspirin  EC 81 MG tablet Take 1 tablet (81 mg total) by mouth daily. Swallow whole. 90 tablet 3   CALCIUM -MAGNESIUM -ZINC  PO Take 1 capsule by mouth daily.     Coenzyme Q10 (CO Q 10 PO) Take 1 capsule by mouth daily.     [employer] SUNSCREEN SPF 30 EX apply topically to face and body daily for 30     cyanocobalamin  (VITAMIN B12) 500 MCG tablet Take 500 mcg by mouth daily.     diazepam  (VALIUM ) 5 MG tablet TAKE 1 TABLET BY MOUTH AT BEDTIME AS NEEDED FOR ANXIETY 15 tablet 1   ezetimibe  (ZETIA ) 10 MG tablet Take 1 tablet (10 mg total) by mouth daily. 90 tablet 3   Multiple Vitamin (MULTIVITAMIN) capsule Take 1 capsule by mouth daily.     nitroGLYCERIN  (NITROSTAT ) 0.4 MG SL tablet Place 1 tablet (0.4 mg total) under the tongue every 5 (five) minutes as needed for chest pain (If you take 3 tablets without resolution of chest pain, proceed to ER for evaluation.). 30 tablet 3   rosuvastatin  (CRESTOR ) 40 MG tablet Take 1 tablet (40 mg total) by mouth daily. 90 tablet 2   Bacillus Coagulans-Inulin (PROBIOTIC) 1-250 BILLION-MG CAPS as directed Orally (Patient not taking:  Reported on 01/22/2023)     cholecalciferol  (VITAMIN D ) 1000 units tablet Take 1,000 Units by mouth daily. (Patient not taking: Reported on 01/22/2023)     cyclobenzaprine  (FLEXERIL ) 10 MG tablet TAKE ONE TABLET BY MOUTH THREE TIMES A DAY AS NEEDED FOR MUSCLE SPASMS 15 tablet 0   diltiazem  (CARDIZEM ) 30 MG tablet TAKE 1 TABLET BY MOUTH EVERY 4 HOURS AS NEEDED FOR HEART RATE >100 (Patient not taking: Reported on 11/30/2023) 30 tablet 2   typhoid (VIVOTIF ) DR capsule Take 1 capsule by mouth every other day. 4 capsule 0   No current facility-administered medications on file prior to visit.    BP 110/80   Pulse 64   Temp 98.2 F (36.8 C) (Oral)   Ht 6' (1.829 m)   Wt 189 lb (85.7 kg)   SpO2 96%   BMI 25.63 kg/m       Objective:   Physical Exam Vitals and nursing note reviewed.  Constitutional:      General: He is not in acute distress.    Appearance: Normal appearance. He is not ill-appearing.  HENT:     Head: Normocephalic and atraumatic.     Right Ear: Tympanic membrane, ear canal and external ear normal. There is no impacted cerumen.     Left Ear: Tympanic membrane, ear canal and external ear normal. There is no impacted cerumen.     Nose: Nose normal. No congestion or rhinorrhea.     Mouth/Throat:     Mouth: Mucous membranes are moist.     Pharynx: Oropharynx is clear.  Eyes:     Extraocular Movements: Extraocular movements intact.     Conjunctiva/sclera: Conjunctivae normal.     Pupils: Pupils are equal, round, and reactive to light.  Neck:     Vascular: No carotid bruit.  Cardiovascular:     Rate and Rhythm: Normal rate and  regular rhythm.     Pulses: Normal pulses.     Heart sounds: No murmur heard.    No friction rub. No gallop.  Pulmonary:     Effort: Pulmonary effort is normal.     Breath sounds: Normal breath sounds.  Abdominal:     General: Abdomen is flat. Bowel sounds are normal. There is no distension.     Palpations: Abdomen is soft. There is no mass.      Tenderness: There is no abdominal tenderness. There is no guarding or rebound.     Hernia: No hernia is present.  Musculoskeletal:        General: Normal range of motion.     Cervical back: Normal range of motion and neck supple.  Lymphadenopathy:     Cervical: No cervical adenopathy.  Skin:    General: Skin is warm and dry.     Capillary Refill: Capillary refill takes less than 2 seconds.  Neurological:     General: No focal deficit present.     Mental Status: He is alert and oriented to person, place, and time.  Psychiatric:        Mood and Affect: Mood normal.        Behavior: Behavior normal.        Thought Content: Thought content normal.        Judgment: Judgment normal.           Assessment & Plan:  1. Dyslipidemia (Primary) - Continue with current medication therapy  - Lipid panel; Future - TSH; Future - CBC; Future - Comprehensive metabolic panel with GFR; Future  2. Coronary artery disease involving native coronary artery of native heart without angina pectoris - Continue with current medication therapy - Continue to eat healthy and exercise  - Lipid panel; Future - TSH; Future - CBC; Future - Comprehensive metabolic panel with GFR; Future  3. OSA on CPAP - Denies symptoms without being on CPAP  - Lipid panel; Future - TSH; Future - CBC; Future - Comprehensive metabolic panel with GFR; Future  4. Typical atrial flutter (HCC) - SR today. He has not had to take Diltiazem   - Lipid panel; Future - TSH; Future - CBC; Future - Comprehensive metabolic panel with GFR; Future  5. HSV-1 (herpes simplex virus 1) infection - Continue with Acyclovir  PRN   6. Transient insomnia  - diazepam  (VALIUM ) 5 MG tablet; Take 1 tablet (5 mg total) by mouth at bedtime as needed for anxiety.  Dispense: 15 tablet; Refill: 2  7. Benign prostatic hyperplasia without lower urinary tract symptoms  - PSA; Future  8. Need for Streptococcus pneumoniae vaccination  -  Pneumococcal conjugate vaccine 20-valent (Prevnar 20)

## 2023-11-30 NOTE — Patient Instructions (Signed)
 It was great seeing you today   We will follow up with you regarding your lab work   Please let me know if you need anything

## 2023-12-11 ENCOUNTER — Other Ambulatory Visit: Payer: Self-pay | Admitting: Medical Genetics

## 2023-12-16 DIAGNOSIS — Z23 Encounter for immunization: Secondary | ICD-10-CM | POA: Diagnosis not present

## 2023-12-17 ENCOUNTER — Ambulatory Visit

## 2023-12-17 VITALS — Ht 72.0 in | Wt 183.0 lb

## 2023-12-17 DIAGNOSIS — Z1211 Encounter for screening for malignant neoplasm of colon: Secondary | ICD-10-CM

## 2023-12-17 MED ORDER — NA SULFATE-K SULFATE-MG SULF 17.5-3.13-1.6 GM/177ML PO SOLN: 1.0000 | Freq: Once | ORAL | 0 refills | Status: AC

## 2023-12-17 NOTE — Progress Notes (Signed)
 No egg or soy allergy known to patient  No issues known to pt with past sedation with any surgeries or procedures Patient denies ever being told they had issues or difficulty with intubation  No FH of Malignant Hyperthermia Pt is not on diet pills Pt is not on  home 02  Pt is not on blood thinners  Pt denies issues with constipation  No A fib or A flutter- A flutter Ablation in 2019  Have any cardiac testing pending--NO Pt can ambulate-independently  Pt denies use of chewing tobacco Discussed diabetic I weight loss medication holds Discussed NSAID holds Checked BMI Pt instructed to use Singlecare.com or GoodRx for a price reduction on prep  Patient's chart reviewed by Norleen Schillings CNRA prior to previsit and patient appropriate for the [facility].  Pre visit completed and red dot placed by patient's name on their procedure day (on provider's schedule).

## 2023-12-31 ENCOUNTER — Encounter: Payer: Self-pay | Admitting: Gastroenterology

## 2023-12-31 ENCOUNTER — Ambulatory Visit (AMBULATORY_SURGERY_CENTER): Admitting: Gastroenterology

## 2023-12-31 VITALS — BP 116/66 | HR 60 | Temp 97.5°F | Resp 11 | Ht 72.0 in | Wt 183.0 lb

## 2023-12-31 DIAGNOSIS — K6389 Other specified diseases of intestine: Secondary | ICD-10-CM | POA: Diagnosis not present

## 2023-12-31 DIAGNOSIS — K648 Other hemorrhoids: Secondary | ICD-10-CM

## 2023-12-31 DIAGNOSIS — K5730 Diverticulosis of large intestine without perforation or abscess without bleeding: Secondary | ICD-10-CM

## 2023-12-31 DIAGNOSIS — I480 Paroxysmal atrial fibrillation: Secondary | ICD-10-CM | POA: Diagnosis not present

## 2023-12-31 DIAGNOSIS — K6289 Other specified diseases of anus and rectum: Secondary | ICD-10-CM

## 2023-12-31 DIAGNOSIS — G4733 Obstructive sleep apnea (adult) (pediatric): Secondary | ICD-10-CM | POA: Diagnosis not present

## 2023-12-31 DIAGNOSIS — D123 Benign neoplasm of transverse colon: Secondary | ICD-10-CM | POA: Diagnosis not present

## 2023-12-31 DIAGNOSIS — Z1211 Encounter for screening for malignant neoplasm of colon: Secondary | ICD-10-CM | POA: Diagnosis not present

## 2023-12-31 MED ORDER — SODIUM CHLORIDE 0.9 % IV SOLN: 500.0000 mL | Freq: Once | INTRAVENOUS | Status: DC

## 2023-12-31 NOTE — Progress Notes (Signed)
 Called to room to assist during endoscopic procedure.  Patient ID and intended procedure confirmed with present staff. Received instructions for my participation in the procedure from the performing physician.

## 2023-12-31 NOTE — Progress Notes (Signed)
 [facility] History and Physical   Primary Care Physician:  Merna Huxley, NP   Reason for Procedure:   Colon cancer screening  Plan:    colonoscopy     HPI: Steven Byrd is a 68 y.o. male  here for colonoscopy screening - last exam 11/2013.   Patient denies any bowel symptoms at this time. No family history of colon cancer known. Otherwise feels well without any cardiopulmonary symptoms.   I have discussed risks / benefits of anesthesia and endoscopic procedure with Steven Byrd and they wish to proceed with the exams as outlined today.    Past Medical History:  Diagnosis Date   AAA (abdominal aortic aneurysm) 2020   3.2 cm in 2020    Atrial flutter (HCC)    Dyslipidemia    OSA on CPAP 01/20/2014   Paroxysmal atrial fibrillation (HCC)    Sleep apnea     Past Surgical History:  Procedure Laterality Date   A-FLUTTER ABLATION N/A 03/22/2017   Procedure: A-FLUTTER ABLATION;  Surgeon: Kelsie Agent, MD;  Location: [facility];  Service: Cardiovascular;  Laterality: N/A;   CRANIOTOMY Right 09/04/2017   Procedure: CRANIOTOMY FOR SUBDURAL HEMATOMA;  Surgeon: Unice Pac, MD;  Location: [facility] OR;  Service: Neurosurgery;  Laterality: Right;    Prior to Admission medications   Medication Sig Start Date End Date Taking? Authorizing Provider  acyclovir  (ZOVIRAX ) 800 MG tablet Take 1 tablet (800 mg total) by mouth 3 (three) times daily as needed. 12/21/21  Yes Nafziger, Huxley, NP  aspirin  EC 81 MG tablet Take 1 tablet (81 mg total) by mouth daily. Swallow whole. 02/22/21  Yes Vannie Reche RAMAN, NP  CALCIUM -MAGNESIUM -ZINC  PO Take 1 capsule by mouth daily.   Yes [provider]  cyanocobalamin  (VITAMIN B12) 500 MCG tablet Take 500 mcg by mouth daily.   Yes [provider]  diazepam  (VALIUM ) 5 MG tablet Take 1 tablet (5 mg total) by mouth at bedtime as needed for anxiety. 11/30/23  Yes Nafziger, Huxley, NP  ezetimibe  (ZETIA ) 10 MG tablet Take 1  tablet (10 mg total) by mouth daily. 03/26/23  Yes Hilty, Vinie BROCKS, MD  minoxidil (LONITEN) 2.5 MG tablet Take 2.5 mg by mouth. 1.25mg  daily 12/08/23  Yes [provider]  Multiple Vitamin (MULTIVITAMIN) capsule Take 1 capsule by mouth daily.   Yes [provider]  Multiple Vitamins-Minerals (SYSTANE ICAPS AREDS2) CAPS Take by mouth.   Yes [provider]  rosuvastatin  (CRESTOR ) 40 MG tablet Take 1 tablet (40 mg total) by mouth daily. 05/09/23  Yes Hilty, Vinie BROCKS, MD  Coenzyme Q10 (CO Q 10 PO) Take 1 capsule by mouth daily.    [provider]  CVS SUNSCREEN SPF 30 EX apply topically to face and body daily for 30    [provider]  diltiazem  (CARDIZEM ) 30 MG tablet TAKE 1 TABLET BY MOUTH EVERY 4 HOURS AS NEEDED FOR HEART RATE >100 Patient not taking: Reported on 12/17/2023 12/06/21   Mona Vinie BROCKS, MD  typhoid (VIVOTIF ) DR capsule Take 1 capsule by mouth every other day. Patient not taking: Reported on 12/17/2023 03/10/22   Micheal Wolm ORN, MD    Current Outpatient Medications  Medication Sig Dispense Refill   acyclovir  (ZOVIRAX ) 800 MG tablet Take 1 tablet (800 mg total) by mouth 3 (three) times daily as needed. 30 tablet 3   aspirin  EC 81 MG tablet Take 1 tablet (81 mg total) by mouth daily. Swallow whole. 90 tablet 3  CALCIUM -MAGNESIUM -ZINC  PO Take 1 capsule by mouth daily.     cyanocobalamin  (VITAMIN B12) 500 MCG tablet Take 500 mcg by mouth daily.     diazepam  (VALIUM ) 5 MG tablet Take 1 tablet (5 mg total) by mouth at bedtime as needed for anxiety. 15 tablet 2   ezetimibe  (ZETIA ) 10 MG tablet Take 1 tablet (10 mg total) by mouth daily. 90 tablet 3   minoxidil (LONITEN) 2.5 MG tablet Take 2.5 mg by mouth. 1.25mg  daily     Multiple Vitamin (MULTIVITAMIN) capsule Take 1 capsule by mouth daily.     Multiple Vitamins-Minerals (SYSTANE ICAPS AREDS2) CAPS Take by mouth.     rosuvastatin  (CRESTOR ) 40 MG tablet Take 1 tablet (40 mg total) by mouth  daily. 90 tablet 2   Coenzyme Q10 (CO Q 10 PO) Take 1 capsule by mouth daily.     CVS SUNSCREEN SPF 30 EX apply topically to face and body daily for 30     diltiazem  (CARDIZEM ) 30 MG tablet TAKE 1 TABLET BY MOUTH EVERY 4 HOURS AS NEEDED FOR HEART RATE >100 (Patient not taking: Reported on 12/17/2023) 30 tablet 2   typhoid (VIVOTIF ) DR capsule Take 1 capsule by mouth every other day. (Patient not taking: Reported on 12/17/2023) 4 capsule 0   Current Facility-Administered Medications  Medication Dose Route Frequency Provider Last Rate Last Admin   0.9 %  sodium chloride  infusion  500 mL Intravenous Once Lalani Winkles, Elspeth SQUIBB, MD        Allergies as of 12/31/2023 - Review Complete 12/31/2023  Allergen Reaction Noted   Ativan  [lorazepam ] Other (See Comments) 09/13/2017    Family History  Problem Relation Age of Onset   Hypertension Mother        ?   Diabetes Father    Stroke Father    Heart attack Maternal Grandfather    Colon cancer Neg Hx    Esophageal cancer Neg Hx    Rectal cancer Neg Hx    Stomach cancer Neg Hx     Social History   Socioeconomic History   Marital status: Significant Other    Spouse name: Not on file   Number of children: Not on file   Years of education: Not on file   Highest education level: Bachelor's degree (e.g., BA, AB, BS)  Occupational History   Not on file  Tobacco Use   Smoking status: Former    Current packs/day: 0.00    Types: Cigarettes    Quit date: 10/16/1998    Years since quitting: 25.2   Smokeless tobacco: Never  Vaping Use   Vaping status: Never Used  Substance and Sexual Activity   Alcohol  use: Yes    Alcohol /week: 14.0 - 21.0 standard drinks of alcohol     Types: 14 - 21 Glasses of wine per week    Comment: red wine   Drug use: No   Sexual activity: Yes    Partners: Female  Other Topics Concern   Not on file  Social History Narrative   Has a administrator, 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       He likes to hike, scuba dive, and travel      Lives in single story home    Alisa - significant other   4 year degree   Right handed    Social Drivers of Health  Financial Resource Strain: Low Risk  (11/09/2023)   Overall Financial Resource Strain (CARDIA)    Difficulty of Paying Living Expenses: Not hard at all  Food Insecurity: No Food Insecurity (11/09/2023)   Hunger Vital Sign    Worried About Running Out of Food in the Last Year: Never true    Ran Out of Food in the Last Year: Never true  Transportation Needs: No Transportation Needs (11/09/2023)   PRAPARE - Administrator, Civil Service (Medical): No    Lack of Transportation (Non-Medical): No  Physical Activity: Sufficiently Active (11/09/2023)   Exercise Vital Sign    Days of Exercise per Week: 6 days    Minutes of Exercise per Session: 60 min  Stress: No Stress Concern Present (11/09/2023)   [employer] of Occupational Health - Occupational Stress Questionnaire    Feeling of Stress: Only a little  Social Connections: Socially Integrated (11/09/2023)   Social Connection and Isolation Panel    Frequency of Communication with Friends and Family: More than three times a week    Frequency of Social Gatherings with Friends and Family: Three times a week    Attends Religious Services: 1 to 4 times per year    Active Member of Clubs or Organizations: Yes    Attends Banker Meetings: Not on file    Marital Status: Living with partner  Intimate Partner Violence: Not At Risk (11/09/2023)   Humiliation, Afraid, Rape, and Kick questionnaire    Fear of Current or Ex-Partner: No    Emotionally Abused: No    Physically Abused: No    Sexually Abused: No    Review of Systems: All other review of systems negative except as mentioned in the HPI.  Physical Exam: Vital signs BP 106/76   Pulse 63   Temp (!) 97.5 F (36.4 C) (Temporal)   Ht 6' (1.829 m)   Wt  183 lb (83 kg)   SpO2 96%   BMI 24.82 kg/m   General:   Alert,  Well-developed, pleasant and cooperative in NAD Lungs:  Clear throughout to auscultation.   Heart:  Regular rate and rhythm Abdomen:  Soft, nontender and nondistended.   Neuro/Psych:  Alert and cooperative. Normal mood and affect. A and O x 3  Marcey Naval, MD [facility]

## 2023-12-31 NOTE — Progress Notes (Signed)
 Report to PACU, RN, vss, BBS= Clear.

## 2023-12-31 NOTE — Patient Instructions (Signed)

## 2023-12-31 NOTE — Op Note (Signed)
 [facility] Patient Name: Steven Byrd Procedure Date: 12/31/2023 3:03 PM MRN: [id number] Endoscopist: Elspeth P. Leigh , MD, [id number] Age: 68 Referring MD:  Date of Birth: Apr 10, 1955 Gender: Male Account #: [id number] Procedure:                Colonoscopy Indications:              Screening for colorectal malignant neoplasm - last                            exam 11/2013 Medicines:                Monitored Anesthesia Care Procedure:                Pre-Anesthesia Assessment:                           - Prior to the procedure, a History and Physical                            was performed, and patient medications and                            allergies were reviewed. The patient's tolerance of                            previous anesthesia was also reviewed. The risks                            and benefits of the procedure and the sedation                            options and risks were discussed with the patient.                            All questions were answered, and informed consent                            was obtained. Prior Anticoagulants: The patient has                            taken no anticoagulant or antiplatelet agents. ASA                            Grade Assessment: II - A patient with mild systemic                            disease. After reviewing the risks and benefits,                            the patient was deemed in satisfactory condition to                            undergo the procedure.  After obtaining informed consent, the colonoscope                            was passed under direct vision. Throughout the                            procedure, the patient's blood pressure, pulse, and                            oxygen saturations were monitored continuously. The                            CF [id number] #[id number] was introduced through the anus                            and advanced to the the terminal  ileum, with                            identification of the appendiceal orifice and IC                            valve. The colonoscopy was performed without                            difficulty. The patient tolerated the procedure                            well. The quality of the bowel preparation was                            adequate. The terminal ileum, ileocecal valve,                            appendiceal orifice, and rectum were photographed. Scope In: 3:06:50 PM Scope Out: 3:27:11 PM Scope Withdrawal Time: 0 hours 17 minutes 29 seconds  Total Procedure Duration: 0 hours 20 minutes 21 seconds  Findings:                 The perianal and digital rectal examinations were                            normal.                           The terminal ileum appeared normal.                           Multiple diverticula were found in the entire colon.                           A 3 mm polyp was found in the transverse colon. The                            polyp was sessile. The polyp was removed with a  cold snare. Resection and retrieval were complete.                           Anal papilla(e) was hypertrophied. Biopsies were                            taken with a cold forceps for histology, rule out                            AIN.                           Internal hemorrhoids were found during                            retroflexion. The hemorrhoids were small.                           The exam was otherwise without abnormality. Complications:            No immediate complications. Estimated blood loss:                            Minimal. Estimated Blood Loss:     Estimated blood loss was minimal. Impression:               - The examined portion of the ileum was normal.                           - Diverticulosis in the entire examined colon.                           - One 3 mm polyp in the transverse colon, removed                            with a cold  snare. Resected and retrieved.                           - Anal papilla(e) were hypertrophied. Biopsied.                           - Internal hemorrhoids.                           - The examination was otherwise normal. Recommendation:           - Patient has a contact number available for                            emergencies. The signs and symptoms of potential                            delayed complications were discussed with the                            patient. Return to normal activities tomorrow.  Written discharge instructions were provided to the                            patient.                           - Resume previous diet.                           - Continue present medications.                           - Await pathology results. Elspeth P. Melody Cirrincione, MD 12/31/2023 3:31:10 PM This report has been signed electronically.

## 2023-12-31 NOTE — Progress Notes (Signed)
 Pt's states no medical or surgical changes since previsit or office visit.

## 2024-01-01 ENCOUNTER — Telehealth: Payer: Self-pay

## 2024-01-01 NOTE — Telephone Encounter (Signed)
  Follow up Call-     12/31/2023    2:36 PM  Call back number  Post procedure Call Back phone  # [phone]  Permission to leave phone message Yes     Patient questions:  Do you have a fever, pain , or abdominal swelling? No. Pain Score  0 *  Have you tolerated food without any problems? Yes.    Have you been able to return to your normal activities? Yes.    Do you have any questions about your discharge instructions: Diet   No. Medications  No. Follow up visit  No.  Do you have questions or concerns about your Care? No.  Actions: * If pain score is 4 or above: No action needed, pain <4.

## 2024-01-03 ENCOUNTER — Ambulatory Visit: Payer: Self-pay | Admitting: Gastroenterology

## 2024-01-03 LAB — SURGICAL PATHOLOGY

## 2024-01-16 ENCOUNTER — Ambulatory Visit (HOSPITAL_BASED_OUTPATIENT_CLINIC_OR_DEPARTMENT_OTHER)
Admission: RE | Admit: 2024-01-16 | Discharge: 2024-01-16 | Disposition: A | Discharge status: Home / Self Care | Source: Ambulatory Visit | Attending: Family | Admitting: Family

## 2024-01-16 DIAGNOSIS — I7121 Aneurysm of the ascending aorta, without rupture: Secondary | ICD-10-CM | POA: Diagnosis not present

## 2024-01-16 DIAGNOSIS — E041 Nontoxic single thyroid nodule: Secondary | ICD-10-CM | POA: Diagnosis not present

## 2024-01-16 DIAGNOSIS — I77819 Aortic ectasia, unspecified site: Secondary | ICD-10-CM | POA: Diagnosis not present

## 2024-01-16 MED ORDER — IOHEXOL 350 MG/ML SOLN
75.0000 mL | Freq: Once | INTRAVENOUS | Status: AC | PRN
  Administered 2024-01-16: 75 mL via INTRAVENOUS

## 2024-01-18 ENCOUNTER — Ambulatory Visit (HOSPITAL_BASED_OUTPATIENT_CLINIC_OR_DEPARTMENT_OTHER): Payer: Self-pay | Admitting: Family

## 2024-01-25 ENCOUNTER — Other Ambulatory Visit: Payer: Self-pay | Admitting: Internal Medicine

## 2024-01-25 DIAGNOSIS — I2510 Atherosclerotic heart disease of native coronary artery without angina pectoris: Secondary | ICD-10-CM

## 2024-01-25 DIAGNOSIS — E785 Hyperlipidemia, unspecified: Secondary | ICD-10-CM

## 2024-01-25 DIAGNOSIS — E7849 Other hyperlipidemia: Secondary | ICD-10-CM

## 2024-01-30 ENCOUNTER — Encounter: Payer: Self-pay | Admitting: Internal Medicine

## 2024-01-30 ENCOUNTER — Ambulatory Visit: Attending: Internal Medicine | Admitting: Internal Medicine

## 2024-01-30 VITALS — BP 90/62 | HR 61 | Ht 72.0 in | Wt 190.0 lb

## 2024-01-30 DIAGNOSIS — I714 Abdominal aortic aneurysm, without rupture, unspecified: Secondary | ICD-10-CM | POA: Insufficient documentation

## 2024-01-30 DIAGNOSIS — E7849 Other hyperlipidemia: Secondary | ICD-10-CM | POA: Insufficient documentation

## 2024-01-30 DIAGNOSIS — I25118 Atherosclerotic heart disease of native coronary artery with other forms of angina pectoris: Secondary | ICD-10-CM | POA: Diagnosis not present

## 2024-01-30 DIAGNOSIS — E785 Hyperlipidemia, unspecified: Secondary | ICD-10-CM | POA: Insufficient documentation

## 2024-01-30 DIAGNOSIS — I2510 Atherosclerotic heart disease of native coronary artery without angina pectoris: Secondary | ICD-10-CM | POA: Insufficient documentation

## 2024-01-30 DIAGNOSIS — I77819 Aortic ectasia, unspecified site: Secondary | ICD-10-CM | POA: Diagnosis not present

## 2024-01-30 NOTE — Patient Instructions (Addendum)
 Medication Instructions:  NO CHANGES  *If you need a refill on your cardiac medications before your next appointment, please call your pharmacy*  Lab Work: FASTING lab work - NMR lipoprofile and HS-CRP  If you have labs (blood work) drawn today and your tests are completely normal, you will receive your results only by: MyChart Message (if you have MyChart) OR A paper copy in the mail If you have any lab test that is abnormal or we need to change your treatment, we will call you to review the results.  Testing/Procedures: CT angiogram chest/aorta/abdomen/pelvis in 1 year  Follow-Up: At [facility], you and your health needs are our priority.  As part of our continuing mission to provide you with exceptional heart care, our providers are all part of one team.  This team includes your primary Cardiologist (physician) and Advanced Practice Providers or APPs (Physician Assistants and Nurse Practitioners) who all work together to provide you with the care you need, when you need it.  Your next appointment:   12 months with Dr. Mona -- after CT test  We recommend signing up for the patient portal called MyChart.  Sign up information is provided on this After Visit Summary.  MyChart is used to connect with patients for Virtual Visits (Telemedicine).  Patients are able to view lab/test results, encounter notes, upcoming appointments, etc.  Non-urgent messages can be sent to your provider as well.   To learn more about what you can do with MyChart, go to [email or web address].   Other Instructions

## 2024-01-30 NOTE — Progress Notes (Signed)
 OFFICE NOTE  Chief Complaint:  Routine follow-up  Primary Care Physician: Merna Huxley, NP  HPI:  Steven Byrd is a 68 y.o. male with a past medical history significant for hyperlipidemia. He has been having recurrent palpitations over the past month, but worse in the past 2 days. He denies chest pain or dyspnea. He has been able to exercise without much difficulty, however, he noted his HR was higher than normal. Typically his resting HR is in the 50-60 range. He went to his PCP who referred him to the ER for an SVT. He was noted to be in 2:1 atrial flutter in the ER. He was given adenosine  in the ER which caused a pause, but did not slow his rhythm. He was then given 15 mg IV cardizem  and eventually converted back to sinus rhythm. His CHADSVASC score is 0. At discharge I recommended he stay on aspirin  and start on long-acting Cardizem . He said that he wished to only take medication as needed. I warned him that he may have recurrent atrial flutter and affect did have a couple of episodes. He called the office and was eventually placed on Cardizem  120 mg 3 times a day. He's been taking this dose and has noted no recurrence of his atrial flutter. He had several questions today about management of atrial flutter and is pretty clearly is not what take medications for long periods of time. In addition he reports that in the past he's had an informal sleep study and underwent a uvulopalatoplasty, but still reports that he is told that he snores, stops breathing and may very well have apnea. Of course this may be a risk factor for his atrial flutter. He did undergo an echocardiogram which showed a normal EF, normal wall motion and normal diastolic function. Wall thickness is also normal. Chamber sizes are normal. There was a top normal descending aortic diameter. Otherwise agree benign echocardiogram. As mentioned in my hospital consult note, he is very athletic, exercises a number days a week  including long-distance running and cycling and he denies any anginal symptoms.  07/23/2017  Steven Byrd returns today for follow-up of atrial flutter.  He recently was evaluated by Dr. Kelsie and determined to be candidate for atrial flutter ablation.  He underwent that procedure in January.  This was successful and resolve the symptoms.  He is CHADSVASC score is 0 and he was instructed to come off of aspirin , diltiazem  and wean metoprolol .  He is now off of all medications and doing well.  Blood pressures well controlled.  He denies any chest pain or shortness of breath or recurrent palpitations.  01/09/2018  Steven Byrd was seen today in routine follow-up.  Unfortunately he was recently hospitalized and had some paroxysmal atrial fibrillation.  This was related to a fall which involved a subdural hematoma.  He was placed on Cardizem  but cannot be anticoagulated.  He followed up in the A. fib clinic and had been did not to have any recurrent A. fib.  He follows up with me today.  Overall he seems to be doing well.  He is rehabilitating and has follow-up with neurology.  07/27/2021  Steven Byrd returns today for follow-up.  He was recently seen by Reche Finder, NP.  He had had a calcium  score because of concern about coronary artery disease and some recurrent palpitations.  This was markedly elevated at 1517, 94th percentile for age and sex matched control.  Ultimately a CT coronary angiogram was  pursued to rule out any obstructive coronary disease.  This demonstrated again a very high calcium  score 1004-61, 93rd percentile for age and sex matched control however only minimal nonobstructive coronary disease which is reassuring.  Today he returns and remains asymptomatic.  He is somewhat concerned as to why he may have had such an early onset significant coronary calcification.  I suspect there is a genetic etiology for this.  Based on this information, he is lipid-lowering was intensified and  currently he is on rosuvastatin  40 and ezetimibe  10 mg daily.  Total cholesterol now 138, triglycerides 49, HDL 64 and LDL 63 which appears to be at target.  12/09/2021  Steven Byrd returns today for follow-up.  Since I last saw him I recommended adding ezetimibe  to his statin for better lipid lowering.  So far he seems to be tolerating that well.  He is LDL particle number has come down significantly from 1139-657.  LDL-C now 54 down from 85.  His triglycerides are normal.  Small LDL particle number is low as well.  His LP(a) has been negative.  He denies any recurrent palpitations or atrial fibrillation.  He has had no worsening shortness of breath or chest pain.  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  01/22/2023  Steven Byrd seen today in follow-up.  He continues to have good control of his lipids.  LDL particle #611, LDL 68, triglycerides 100 and HDL 61.  He had a repeat CT scan of the aorta which shows a stable ascending thoracic aorta at 40 mm.  Aortic atherosclerosis was otherwise noted but no other findings.  He reports being very active.  He recently did some hiking on the [place] trail and was essentially asymptomatic with that.  He plans on doing his annual trip to [place].  01/30/2024  Steven Byrd is seen today in follow-up.  Overall he continues to do well.  He is asymptomatic and exercises regularly and plays golf actively.  He denies any chest pain or worsening shortness of breath.  He was noted on CT scan to have a dilated ascending aorta.  This measured last at 42 mm which is stable to increase slightly but still in the mild range.  He also was noted to have a 3.2 cm abdominal aortic aneurysm by ultrasound in 2020.  Recommended follow-up of that was in 3 years however that has not been reassessed.  Lipids have been well-controlled.  His last labs in September however did show mildly  elevated triglycerides with total cholesterol 141, triglycerides 168, HDL 59 and LDL 48.  He is interested in particle testing again today as well as testing for inflammation.  Blood pressure is excellent at 90/62.  He is on low-dose aspirin  8.  He did have a very high calcium  score as above.  PMHx:  Past Medical History:  Diagnosis Date   AAA (abdominal aortic aneurysm) 2020   3.2 cm in 2020    Atrial flutter (HCC)    Dyslipidemia    OSA on CPAP 01/20/2014   Paroxysmal atrial fibrillation (HCC)    Sleep apnea     Past Surgical History:  Procedure Laterality Date   A-FLUTTER ABLATION N/A 03/22/2017   Procedure: A-FLUTTER ABLATION;  Surgeon: Kelsie Agent, MD;  Location: [facility] INVASIVE CV LAB;  Service: Cardiovascular;  Laterality: N/A;   CRANIOTOMY Right 09/04/2017   Procedure: CRANIOTOMY FOR SUBDURAL HEMATOMA;  Surgeon: Unice Pac, MD;  Location: [facility] OR;  Service: Neurosurgery;  Laterality: Right;    FAMHx:  Family History  Problem Relation Age of Onset   Hypertension Mother        ?   Diabetes Father    Stroke Father    Heart attack Maternal Grandfather    Colon cancer Neg Hx    Esophageal cancer Neg Hx    Rectal cancer Neg Hx    Stomach cancer Neg Hx     SOCHx:   reports that he quit smoking about 25 years ago. His smoking use included cigarettes. He has never used smokeless tobacco. He reports current alcohol  use of about 14.0 - 21.0 standard drinks of alcohol  per week. He reports that he does not use drugs.  ALLERGIES:  Allergies  Allergen Reactions   Ativan  [Lorazepam ] Other (See Comments)    irritable and agitated    ROS: Pertinent items noted in HPI and remainder of comprehensive ROS otherwise negative.  HOME MEDS: Current Outpatient Medications  Medication Sig Dispense Refill   acyclovir  (ZOVIRAX ) 800 MG tablet Take 1 tablet (800 mg total) by mouth 3 (three) times daily as needed. 30 tablet 3   aspirin  EC 81 MG tablet Take 1 tablet (81 mg total) by mouth  daily. Swallow whole. 90 tablet 3   CALCIUM -MAGNESIUM -ZINC  PO Take 1 capsule by mouth daily.     Coenzyme Q10 (CO Q 10 PO) Take 1 capsule by mouth daily.     [employer] SUNSCREEN SPF 30 EX apply topically to face and body daily for 30     cyanocobalamin  (VITAMIN B12) 500 MCG tablet Take 500 mcg by mouth daily.     diazepam  (VALIUM ) 5 MG tablet Take 1 tablet (5 mg total) by mouth at bedtime as needed for anxiety. 15 tablet 2   ezetimibe  (ZETIA ) 10 MG tablet Take 1 tablet (10 mg total) by mouth daily. 90 tablet 3   minoxidil (LONITEN) 2.5 MG tablet Take 2.5 mg by mouth. 1.25mg  daily     Multiple Vitamin (MULTIVITAMIN) capsule Take 1 capsule by mouth daily.     Multiple Vitamins-Minerals (SYSTANE ICAPS AREDS2) CAPS Take by mouth.     rosuvastatin  (CRESTOR ) 40 MG tablet TAKE 1 TABLET BY MOUTH DAILY 90 tablet 0   No current facility-administered medications for this visit.    LABS/IMAGING: No results found for this or any previous visit (from the past 48 hours). No results found.  Lipoprotein (a)  Date/Time Value Ref Range Status  12/01/2021 08:37 AM 62.8 <75.0 nmol/L Final    Comment:    Note:  Values greater than or equal to 75.0 nmol/L may        indicate an independent risk factor for CHD,        but must be evaluated with caution when applied        to non-Caucasian populations due to the        influence of genetic factors on Lp(a) across        ethnicities.       VITALS: BP 90/62   Pulse 61   Ht 6' (1.829 m)   Wt 190 lb (86.2 kg)   SpO2 97%   BMI 25.77 kg/m   EXAM: General appearance: alert and no distress Lungs: clear to auscultation bilaterally Heart: regular rate and rhythm, S1, S2 normal, no murmur, click, rub or gallop Extremities: extremities normal, atraumatic, no cyanosis or edema Neurologic: Grossly normal  EKG: EKG Interpretation Date/Time:  Wednesday January 30 2024 11:13:52  EST Ventricular Rate:  61 PR Interval:  160 QRS Duration:  80 QT  Interval:  404 QTC Calculation: 406 R Axis:   32  Text Interpretation: Sinus rhythm with occasional Premature ventricular complexes When compared with ECG of 27-Oct-2020 10:42, Premature ventricular complexes are now Present Confirmed by Mona Kent [phone]) on 01/30/2024 11:25:45 AM    ASSESSMENT: 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 Negative LP(a)  PLAN: 1.   Steven Byrd remains asymptomatic and is active and exercises regularly.  His cholesterols has been well-controlled except triglycerides recently were elevated and apparently this was fasting.  Would like to recent assess those including NMR and check for inflammation with high-sensitivity CRP.  He has not had any recurrent atrial flutter and remains compliant with CPAP.  His recent aorta was noted to be 42 mm.  He will need a repeat CT angiogram of the aorta next year and we will also scan the abdomen and pelvis as his proximal abdominal aorta was noted to be dilated by ultrasound at 3.2 cm in 2020 but has not been reassessed.  Follow-up with me annually or sooner as necessary.  Kent KYM Mona, MD, [facility], FNLA, FACP  [place]  [facility]  Medical Director of the Advanced Lipid Disorders &  Cardiovascular Risk Reduction Clinic Diplomate of the American Board of Clinical Lipidology Attending Cardiologist  Direct Dial: [phone]  Fax: [phone]  Website:  [email or web address] Kent BROCKS Oluwatosin Bracy 01/30/2024, 11:25 AM

## 2024-01-31 ENCOUNTER — Other Ambulatory Visit: Payer: Self-pay | Admitting: Internal Medicine

## 2024-01-31 DIAGNOSIS — E7849 Other hyperlipidemia: Secondary | ICD-10-CM

## 2024-01-31 DIAGNOSIS — I2510 Atherosclerotic heart disease of native coronary artery without angina pectoris: Secondary | ICD-10-CM

## 2024-01-31 DIAGNOSIS — E785 Hyperlipidemia, unspecified: Secondary | ICD-10-CM

## 2024-02-05 ENCOUNTER — Other Ambulatory Visit: Payer: Self-pay | Admitting: Internal Medicine

## 2024-02-05 DIAGNOSIS — E7849 Other hyperlipidemia: Secondary | ICD-10-CM

## 2024-02-05 DIAGNOSIS — E785 Hyperlipidemia, unspecified: Secondary | ICD-10-CM

## 2024-02-05 DIAGNOSIS — I2510 Atherosclerotic heart disease of native coronary artery without angina pectoris: Secondary | ICD-10-CM

## 2024-02-05 NOTE — Telephone Encounter (Signed)
Duplicate request for rosuvastatin.

## 2024-02-14 DIAGNOSIS — H353131 Nonexudative age-related macular degeneration, bilateral, early dry stage: Secondary | ICD-10-CM | POA: Diagnosis not present

## 2024-02-14 DIAGNOSIS — H04123 Dry eye syndrome of bilateral lacrimal glands: Secondary | ICD-10-CM | POA: Diagnosis not present

## 2024-02-14 DIAGNOSIS — H401131 Primary open-angle glaucoma, bilateral, mild stage: Secondary | ICD-10-CM | POA: Diagnosis not present

## 2024-02-14 DIAGNOSIS — H25813 Combined forms of age-related cataract, bilateral: Secondary | ICD-10-CM | POA: Diagnosis not present

## 2024-02-14 LAB — OPHTHALMOLOGY REPORT-SCANNED

## 2024-03-13 ENCOUNTER — Other Ambulatory Visit: Payer: Self-pay | Admitting: Internal Medicine

## 2024-03-21 LAB — NMR, LIPOPROFILE
Cholesterol, Total: 124 mg/dL (ref 100–199)
HDL Particle Number: 37.3 umol/L
HDL-C: 53 mg/dL
LDL Particle Number: 698 nmol/L
LDL Size: 20.4 nm — ABNORMAL LOW
LDL-C (NIH Calc): 56 mg/dL (ref 0–99)
LP-IR Score: 46 — ABNORMAL HIGH
Small LDL Particle Number: 443 nmol/L
Triglycerides: 71 mg/dL (ref 0–149)

## 2024-03-21 LAB — HIGH SENSITIVITY CRP: CRP, High Sensitivity: 0.41 mg/L (ref 0.00–3.00)

## 2024-03-24 ENCOUNTER — Ambulatory Visit: Payer: Self-pay | Admitting: Internal Medicine

## 2024-11-14 ENCOUNTER — Ambulatory Visit
